# Patient Record
Sex: Female | Born: 1960 | Race: White | Hispanic: No | Marital: Married | Smoking: Never smoker
Health system: Southern US, Community
[De-identification: ages and names within clinical notes are randomized; demographics above are authoritative.]

## PROBLEM LIST (undated history)

## (undated) DIAGNOSIS — K649 Unspecified hemorrhoids: Secondary | ICD-10-CM

## (undated) DIAGNOSIS — G4700 Insomnia, unspecified: Secondary | ICD-10-CM

## (undated) DIAGNOSIS — I10 Essential (primary) hypertension: Secondary | ICD-10-CM

## (undated) DIAGNOSIS — K5790 Diverticulosis of intestine, part unspecified, without perforation or abscess without bleeding: Secondary | ICD-10-CM

## (undated) DIAGNOSIS — E119 Type 2 diabetes mellitus without complications: Secondary | ICD-10-CM

## (undated) DIAGNOSIS — K219 Gastro-esophageal reflux disease without esophagitis: Secondary | ICD-10-CM

## (undated) HISTORY — DX: Gastro-esophageal reflux disease without esophagitis: K21.9

## (undated) HISTORY — DX: Diverticulosis of intestine, part unspecified, without perforation or abscess without bleeding: K57.90

## (undated) HISTORY — DX: Insomnia, unspecified: G47.00

## (undated) HISTORY — DX: Essential (primary) hypertension: I10

## (undated) HISTORY — PX: GYNECOLOGIC CRYOSURGERY: SHX857

## (undated) HISTORY — DX: Type 2 diabetes mellitus without complications: E11.9

## (undated) HISTORY — PX: ENDOMETRIAL ABLATION: SHX621

## (undated) HISTORY — DX: Unspecified hemorrhoids: K64.9

---

## 1998-07-12 HISTORY — PX: CHOLECYSTECTOMY: SHX55

## 2004-08-04 ENCOUNTER — Ambulatory Visit: Payer: Self-pay | Admitting: General Practice

## 2004-10-01 ENCOUNTER — Ambulatory Visit: Payer: Self-pay | Admitting: Unknown Physician Specialty

## 2004-11-03 ENCOUNTER — Ambulatory Visit: Payer: Self-pay | Admitting: Gastroenterology

## 2004-12-02 ENCOUNTER — Ambulatory Visit: Payer: Self-pay | Admitting: Gastroenterology

## 2005-01-15 ENCOUNTER — Ambulatory Visit: Payer: Self-pay | Admitting: Gastroenterology

## 2005-02-01 ENCOUNTER — Ambulatory Visit: Payer: Self-pay | Admitting: Gastroenterology

## 2005-08-03 ENCOUNTER — Ambulatory Visit: Payer: Self-pay | Admitting: General Practice

## 2006-08-09 ENCOUNTER — Ambulatory Visit: Payer: Self-pay | Admitting: General Practice

## 2006-09-06 ENCOUNTER — Ambulatory Visit: Payer: Self-pay | Admitting: Gastroenterology

## 2006-09-07 ENCOUNTER — Ambulatory Visit: Payer: Self-pay | Admitting: Gastroenterology

## 2007-05-19 ENCOUNTER — Ambulatory Visit: Payer: Self-pay | Admitting: General Practice

## 2007-07-13 HISTORY — PX: APPENDECTOMY: SHX54

## 2007-08-08 ENCOUNTER — Ambulatory Visit: Payer: Self-pay | Admitting: General Practice

## 2007-10-13 ENCOUNTER — Ambulatory Visit: Payer: Self-pay | Admitting: General Surgery

## 2008-05-20 ENCOUNTER — Ambulatory Visit: Payer: Self-pay | Admitting: Specialist

## 2008-05-31 ENCOUNTER — Ambulatory Visit (HOSPITAL_COMMUNITY): Admission: RE | Admit: 2008-05-31 | Discharge: 2008-06-01 | Payer: Self-pay | Admitting: Neurosurgery

## 2008-06-13 ENCOUNTER — Encounter: Admission: RE | Admit: 2008-06-13 | Discharge: 2008-06-13 | Payer: Self-pay | Admitting: Neurosurgery

## 2008-07-09 ENCOUNTER — Encounter: Admission: RE | Admit: 2008-07-09 | Discharge: 2008-07-09 | Payer: Self-pay | Admitting: Neurosurgery

## 2008-07-12 HISTORY — PX: CERVICAL FUSION: SHX112

## 2008-08-08 ENCOUNTER — Encounter: Admission: RE | Admit: 2008-08-08 | Discharge: 2008-08-08 | Payer: Self-pay | Admitting: Neurosurgery

## 2008-08-27 ENCOUNTER — Ambulatory Visit: Payer: Self-pay | Admitting: General Practice

## 2008-09-23 ENCOUNTER — Ambulatory Visit: Payer: Self-pay | Admitting: Neurosurgery

## 2008-10-31 ENCOUNTER — Encounter: Admission: RE | Admit: 2008-10-31 | Discharge: 2008-10-31 | Payer: Self-pay | Admitting: Neurosurgery

## 2009-09-01 ENCOUNTER — Ambulatory Visit: Payer: Self-pay | Admitting: Unknown Physician Specialty

## 2009-10-10 HISTORY — PX: RETROPUBIC SLING: SHX2343

## 2009-10-30 ENCOUNTER — Ambulatory Visit: Payer: Self-pay | Admitting: Unknown Physician Specialty

## 2009-11-05 ENCOUNTER — Ambulatory Visit: Payer: Self-pay | Admitting: Unknown Physician Specialty

## 2009-11-10 ENCOUNTER — Ambulatory Visit: Payer: Self-pay | Admitting: Unknown Physician Specialty

## 2009-11-14 ENCOUNTER — Emergency Department: Payer: Self-pay | Admitting: Internal Medicine

## 2009-11-19 ENCOUNTER — Ambulatory Visit: Payer: Self-pay | Admitting: Unknown Physician Specialty

## 2009-11-19 HISTORY — PX: REVISION URINARY SLING: SHX6213

## 2010-02-02 ENCOUNTER — Ambulatory Visit: Payer: Self-pay | Admitting: Gastroenterology

## 2010-07-16 ENCOUNTER — Ambulatory Visit
Admission: RE | Admit: 2010-07-16 | Discharge: 2010-07-16 | Payer: Self-pay | Source: Home / Self Care | Attending: Urology | Admitting: Urology

## 2010-07-16 LAB — POCT I-STAT 4, (NA,K, GLUC, HGB,HCT)
Glucose, Bld: 104 mg/dL — ABNORMAL HIGH (ref 70–99)
HCT: 43 % (ref 36.0–46.0)
Hemoglobin: 14.6 g/dL (ref 12.0–15.0)
Potassium: 4.1 mEq/L (ref 3.5–5.1)
Sodium: 140 mEq/L (ref 135–145)

## 2010-09-08 ENCOUNTER — Ambulatory Visit: Payer: Self-pay | Admitting: Unknown Physician Specialty

## 2010-11-23 ENCOUNTER — Other Ambulatory Visit: Payer: Self-pay | Admitting: Neurosurgery

## 2010-11-23 DIAGNOSIS — M542 Cervicalgia: Secondary | ICD-10-CM

## 2010-11-24 NOTE — Op Note (Signed)
Michelle Figueroa, Michelle Figueroa NO.:  [id number]   MEDICAL RECORD NO.:  [id number]          PATIENT TYPE:  INP   LOCATION:  3536                         FACILITY:  [facility]   PHYSICIAN:  Donalee Citrin, M.D.        DATE OF BIRTH:  [birth year]   DATE OF PROCEDURE:  05/31/2008  DATE OF DISCHARGE:                               OPERATIVE REPORT   PREOPERATIVE DIAGNOSIS:  Cervical spondylosis with radiculopathy right  greater than left at C5-6, C6-7 with ruptured disk and spondylosis  causing severe stenosis at each of these levels.   PROCEDURE:  Anterior cervical diskectomy and fusion at C5-6, C6-7 using  a 7-mm cornerstone allograft wedges and a 40-mm venture plate with 629  MHz screw.   SURGEON:  Donalee Citrin, MD   ASSISTANT:  Kathaleen Maser. Pool, MD   ANESTHESIA:  General endotracheal.   HISTORY OF PRESENT ILLNESS:  The patient is a very pleasant 47-year  female who has had long-standing neck and right arm pain with numbness  and tingling in her fingers.  They are refractory in all forms of  treatment.  Preoperative exam showed weakness in the right triceps.  Imaging showed severe stenosis from ruptured disk and spondylosis at C5-  6 and C6-7.  Due to the patient's clinical exam, failure to conservative  treatment,  and MRI findings, the patient was recommended anterior  cervical diskectomy fusion.  Risks and benefits of the operation were  explained to the patient.  He understood and agreed to proceed forth.   The patient was brought into the OR, was induced general anesthesia,  positioned supine, neck flexed slightly and 5 pounds of Holter tracing.  The right side of the neck was prepped and draped in usual sterile  fashion.  Preop incision was localized at the appropriate level.  A  curvilinear incision was made just above the midline to the anterior  border of sternocleidomastoid and superficial layer of the platysma,  this was dissected out and divided longitudinally.  The  avascular plane  to sternomastoid and strap muscle was developed down to the prevertebral  fascia.  Prevertebral fascia was dissected with Kitners.  Intraoperative  x-ray confirmed initially the marked via the C4-5 disk space, so  annulotomy was made 2 disk space below the C5-6, C6-7.  Large anterior  osteophytes bitten off with 2-mm Kerrison punch and then using a high-  speed drill both interspaces were drilled down the posterior annulus and  osteophyte complex.  At this point, the operative microscope draped,  brought into the field under microscopic illumination.  The C6-7 disk  space was first drilled down to the posterior annulus, which was  underbitten with a 1-mm Kerrison punch aggressively and underbitten the  endplate.  PLL was identified, which was removed in a piecemeal fashion  exposing the thecal sac.  Then aggressive underbiting of both endplates  and marching across first to the right C7 pedicle.  There was a marked  spondylosis with facet arthropathy and disk herniations displacing the  right C7 nerve root.  This was all removed in a piecemeal  fashion  decompressing the right C7 nerve root flush with a pedicle and  skeletonizing nerve root with initial few millimeters proximal takeoff.  Then after this adequate decompression was achieved with the right C7  neural foramen, attention marked to marching off and aggressive  underbiting of the C6 and C vertebral bodies.  The uncinate process was  not stopped to the left C7 neural foramen, which was unroofed and  decompressed.  At the end of diskectomy, there was no further stenosis.  Gelfoam was placed in the disk space.  Attention was taken to C5-C6.  Working at C5-6 in a similar fashion.  Aggressive underbiting of both  endplates was achieved.  There was a soft disk herniation displacing  predominantly the leftward aspect of the canal and spinal cord.  This  was all aggressively removed and aggressive underbiting of the  endplates  removed, and a large osteophyte coming off the C5 vertebral body  displacing the spinal cord there, as well as the C6 pedicle was palpated  and C6 nerve root was identified and was decompressed out its foramen.  Then marching across to the right side in a similar fashion.  The right  C6 nerve root was decompressed.  Again, predominant uncinate hypertrophy  and spondylosis was compressed in the proximal right C6 nerve root.  This was all decompressed radically.  At the end of the diskectomy here,  it was also decompressed and was copiously irrigated.  Meticulous  hemostasis was maintained.  A 7-mm allograft wedge was inserted at C5-6  and subsequently at C6-7.  Then a 40-mm Venture plate was placed.  All  screws had excellent purchase.  Locking mechanisms were engaged.  The  wounds were copiously irrigated.  Meticulous hemostasis was maintained.  The platysma was reapproximated with interrupted Vicryl and the skin was  closed with running 4-0 subcuticular, Dermabond, Benzoin, and Steri-  Strips.  The patient went to the recovery room in stable condition.  At  the end of the case, sponge and instrument count was correct.           ______________________________  Donalee Citrin, M.D.     GC/MEDQ  D:  05/31/2008  T:  06/01/2008  Job:  [id number]

## 2010-12-10 ENCOUNTER — Ambulatory Visit
Admission: RE | Admit: 2010-12-10 | Discharge: 2010-12-10 | Disposition: A | Discharge status: Home / Self Care | Payer: BC Managed Care – PPO | Source: Ambulatory Visit | Attending: Neurosurgery | Admitting: Neurosurgery

## 2010-12-10 DIAGNOSIS — M542 Cervicalgia: Secondary | ICD-10-CM

## 2011-04-14 LAB — CBC
HCT: 43.6
Hemoglobin: 14.5
MCHC: 33.3
MCV: 87.2
Platelets: 270
RBC: 5
RDW: 13.1
WBC: 6

## 2011-04-14 LAB — BASIC METABOLIC PANEL
BUN: 13
CO2: 25
Calcium: 9.6
Chloride: 107
Creatinine, Ser: 0.84
GFR calc Af Amer: >60
GFR calc non Af Amer: >60
Glucose, Bld: 113 — ABNORMAL HIGH
Potassium: 4.4
Sodium: 138

## 2011-09-15 ENCOUNTER — Ambulatory Visit: Payer: Self-pay

## 2011-09-27 ENCOUNTER — Ambulatory Visit: Payer: Self-pay | Admitting: General Practice

## 2012-04-03 ENCOUNTER — Ambulatory Visit: Payer: Self-pay | Admitting: General Practice

## 2012-09-19 ENCOUNTER — Ambulatory Visit: Payer: Self-pay | Admitting: General Practice

## 2013-04-12 ENCOUNTER — Ambulatory Visit: Payer: Self-pay | Admitting: Unknown Physician Specialty

## 2013-08-29 ENCOUNTER — Ambulatory Visit: Payer: Self-pay | Admitting: General Practice

## 2013-09-26 ENCOUNTER — Ambulatory Visit: Payer: Self-pay

## 2014-02-15 ENCOUNTER — Ambulatory Visit: Payer: Self-pay | Admitting: Gastroenterology

## 2014-10-01 ENCOUNTER — Ambulatory Visit: Payer: Self-pay

## 2015-02-25 ENCOUNTER — Other Ambulatory Visit: Payer: Self-pay | Admitting: Physician Assistant

## 2015-03-03 ENCOUNTER — Encounter: Payer: Self-pay | Admitting: Dietician

## 2015-03-03 ENCOUNTER — Encounter: Payer: BLUE CROSS/BLUE SHIELD | Attending: Physician Assistant | Admitting: Dietician

## 2015-03-03 ENCOUNTER — Other Ambulatory Visit: Payer: Self-pay | Admitting: Physician Assistant

## 2015-03-03 VITALS — Ht 65.0 in | Wt 185.9 lb

## 2015-03-03 DIAGNOSIS — E669 Obesity, unspecified: Secondary | ICD-10-CM | POA: Insufficient documentation

## 2015-03-03 DIAGNOSIS — E663 Overweight: Secondary | ICD-10-CM

## 2015-03-03 NOTE — Progress Notes (Signed)
Medical Nutrition Therapy: Visit start time: 1030  end time: 1130  Assessment:  Diagnosis: obesity Past medical history: HTN, GERD Psychosocial issues/ stress concerns: patient reports high stress level, does report stress eating Preferred learning method:  . Auditory . Visual .   Current weight: 185.9lbs  Height: 5'5" Medications, supplements: reviewed list in chart with patient Progress and evaluation: patient reports working on weight loss often throughout her life, most recently lost 23lbs prior to son's wedding last year.         Has participated in weight watchers, took phentermine, other programs for weight loss with short-term success.   Physical activity: none recently, was going to gym 3 times per week when losing weight last year.  Dietary Intake:  Usual eating pattern includes 2-3 meals and 0-1 snacks per day. Dining out frequency: 4-5 meals per week.  Breakfast: nutrigrain bar, coffee with 1tsp sugar, half & half Snack: none Lunch: usually sandwich with tomato or chicken ([employer]), no fries. Was eating sandwich thins, taking lunch to work. Snack: occasionally when dinner is late -- frito chips or pimento cheese, wheat thins or triscuits Supper: Grilled meats, some red meats pork chops, chicken. Sometimes salad, potatoes with butter. Largest meal, usually late.  Snack: popcorn, cooked on the stove in oil. 2x a week Beverages: coffee, ginger ale (regular). Reports very little fluid intake during the day.  Nutrition Care Education: Topics covered: weight management Basic nutrition: basic food groups, appropriate nutrient balance Weight control: 1300kcal meal plan, portion control, behavioral changes for weight loss Other lifestyle changes:  Options for resuming regular exercise  Nutritional Diagnosis:  [place]-3.3 Overweight/obesity As related to menopause changes, inactivity, large food portions.  As evidenced by patient report.  Intervention: Instruction as noted  above.   Set goals to improve nutrient balance overall and control carb intake, and to increase physical activity.   Patient felt she would not need RD follow-up at this time, encouraged her to call as needed with any questions or concerns.     Education Materials given:  . Food lists/ Planning A Balanced Meal . Sample meal pattern/ menus: Quick and Heatlhy Meal Ideas, Top 10 diet changes packet . Goals/ instructions   Learner/ who was taught:  . Patient   Level of understanding: Marland Kitchen Verbalizes/ demonstrates competency  Demonstrated degree of understanding via:   Teach back Learning barriers: . None  Willingness to learn/ readiness for change: . Acceptance, ready for change  Monitoring and Evaluation:  Dietary intake, exercise, and body weight      follow up: prn

## 2015-03-03 NOTE — Patient Instructions (Addendum)
   Use whole grain starch options such as brown rice, limit portions by using smaller plates.   Limit regular soda/ ginger ale to 1 glass daily, increase water intake. Average fluid goal 64 oz daily from all sources. Try aiming for 2 bottles/ glasses water daily in addition to coffee and 1 ginger ale.   Plan ahead for balanced meals, lunches to take to work. Use menus and 1300kcal meal plan to help.   Gradually increase exercise as able, begin considering options.

## 2015-07-09 ENCOUNTER — Other Ambulatory Visit: Payer: Self-pay | Admitting: Physician Assistant

## 2015-09-18 ENCOUNTER — Other Ambulatory Visit: Payer: Self-pay | Admitting: Advanced Practice Midwife

## 2015-09-18 DIAGNOSIS — Z1231 Encounter for screening mammogram for malignant neoplasm of breast: Secondary | ICD-10-CM

## 2015-10-03 ENCOUNTER — Emergency Department
Admission: EM | Admit: 2015-10-03 | Discharge: 2015-10-03 | Disposition: A | Discharge status: Home / Self Care | Payer: BLUE CROSS/BLUE SHIELD | Attending: Emergency Medicine | Admitting: Emergency Medicine

## 2015-10-03 ENCOUNTER — Encounter: Payer: Self-pay | Admitting: Emergency Medicine

## 2015-10-03 DIAGNOSIS — K529 Noninfective gastroenteritis and colitis, unspecified: Secondary | ICD-10-CM | POA: Diagnosis not present

## 2015-10-03 DIAGNOSIS — E860 Dehydration: Secondary | ICD-10-CM | POA: Diagnosis present

## 2015-10-03 DIAGNOSIS — Z792 Long term (current) use of antibiotics: Secondary | ICD-10-CM | POA: Insufficient documentation

## 2015-10-03 DIAGNOSIS — Z79899 Other long term (current) drug therapy: Secondary | ICD-10-CM | POA: Diagnosis not present

## 2015-10-03 LAB — COMPREHENSIVE METABOLIC PANEL
ALBUMIN: 4.3 g/dL (ref 3.5–5.0)
ALK PHOS: 87 U/L (ref 38–126)
ALT: 96 U/L — AB (ref 14–54)
AST: 94 U/L — ABNORMAL HIGH (ref 15–41)
Anion gap: 9 (ref 5–15)
BILIRUBIN TOTAL: 0.7 mg/dL (ref 0.3–1.2)
BUN: 16 mg/dL (ref 6–20)
CALCIUM: 9.6 mg/dL (ref 8.9–10.3)
CO2: 21 mmol/L — AB (ref 22–32)
CREATININE: 0.7 mg/dL (ref 0.44–1.00)
Chloride: 109 mmol/L (ref 101–111)
GFR calc Af Amer: >60 mL/min (ref >60)
GFR calc non Af Amer: >60 mL/min (ref >60)
GLUCOSE: 93 mg/dL (ref 65–99)
Potassium: 3.6 mmol/L (ref 3.5–5.1)
SODIUM: 139 mmol/L (ref 135–145)
TOTAL PROTEIN: 7.6 g/dL (ref 6.5–8.1)

## 2015-10-03 LAB — URINALYSIS COMPLETE WITH MICROSCOPIC (ARMC ONLY)
BILIRUBIN URINE: NEGATIVE
GLUCOSE, UA: NEGATIVE mg/dL
Hgb urine dipstick: NEGATIVE
Ketones, ur: NEGATIVE mg/dL
Leukocytes, UA: NEGATIVE
NITRITE: NEGATIVE
PH: 6 (ref 5.0–8.0)
Protein, ur: NEGATIVE mg/dL
Specific Gravity, Urine: 1.011 (ref 1.005–1.030)

## 2015-10-03 LAB — CBC
HCT: 45.9 % (ref 35.0–47.0)
Hemoglobin: 15.8 g/dL (ref 12.0–16.0)
MCH: 29.5 pg (ref 26.0–34.0)
MCHC: 34.4 g/dL (ref 32.0–36.0)
MCV: 85.7 fL (ref 80.0–100.0)
PLATELETS: 220 10*3/uL (ref 150–440)
RBC: 5.35 MIL/uL — ABNORMAL HIGH (ref 3.80–5.20)
RDW: 13.4 % (ref 11.5–14.5)
WBC: 7.5 10*3/uL (ref 3.6–11.0)

## 2015-10-03 LAB — LIPASE, BLOOD: Lipase: 27 U/L (ref 11–51)

## 2015-10-03 LAB — TROPONIN I: Troponin I: <0.03 ng/mL (ref <0.031)

## 2015-10-03 MED ORDER — ONDANSETRON HCL 4 MG/2ML IJ SOLN
INTRAMUSCULAR | Status: AC
  Administered 2015-10-03: 4 mg via INTRAVENOUS
  Filled 2015-10-03: qty 2

## 2015-10-03 MED ORDER — SODIUM CHLORIDE 0.9 % IV SOLN
1000.0000 mL | Freq: Once | INTRAVENOUS | Status: AC
  Administered 2015-10-03: 1000 mL via INTRAVENOUS

## 2015-10-03 MED ORDER — ONDANSETRON HCL 4 MG PO TABS: 4.0000 mg | ORAL_TABLET | Freq: Every day | ORAL | Status: DC | PRN

## 2015-10-03 MED ORDER — ONDANSETRON HCL 4 MG/2ML IJ SOLN
4.0000 mg | Freq: Once | INTRAMUSCULAR | Status: AC
  Administered 2015-10-03: 4 mg via INTRAVENOUS

## 2015-10-03 NOTE — ED Provider Notes (Signed)
[facility] Emergency Department Provider Note  ____________________________________________    I have reviewed the triage vital signs and the nursing notes.   HISTORY  Chief Complaint Dehydration    HPI Michelle Figueroa is a 55 y.o. female who presents with complaints of nausea, diarrhea and abdominal cramping for 2 days. Patient reports she feels dehydrated. She complains of generalized abdominal cramping with watery diarrhea. Severe nausea but no vomiting. Denies fevers or chills. No known sick contacts. No recent travel     History reviewed. No pertinent past medical history.  There are no active problems to display for this patient.   History reviewed. No pertinent past surgical history.  Current Outpatient Rx  Name  Route  Sig  Dispense  Refill  . ALPRAZolam (XANAX) 0.25 MG tablet            0   . ESTRACE VAGINAL 0.1 MG/GM vaginal cream            0     Dispense as written.   . fluconazole (DIFLUCAN) 150 MG tablet            0   . lisinopril (PRINIVIL,ZESTRIL) 10 MG tablet            1   . metoprolol succinate (TOPROL-XL) 50 MG 24 hr tablet            1   . nitrofurantoin, macrocrystal-monohydrate, (MACROBID) 100 MG capsule            0   . omeprazole (PRILOSEC) 20 MG capsule   Oral   Take by mouth.         . ondansetron (ZOFRAN) 4 MG tablet   Oral   Take 1 tablet (4 mg total) by mouth daily as needed for nausea or vomiting.   20 tablet   1   . zolpidem (AMBIEN) 10 MG tablet            4     Allergies Erythromycin ethylsuccinate and Oxycodone-acetaminophen  No family history on file.  Social History Social History  Substance Use Topics  . Smoking status: Never Smoker   . Smokeless tobacco: None  . Alcohol Use: 1.8 oz/week    3 Standard drinks or equivalent per week    Review of Systems  Constitutional: Negative for fever. Eyes: Negative for redness ENT: Negative for sore  throat Cardiovascular: Negative for chest pain Respiratory: Negative for cough Gastrointestinal: As above Genitourinary: Negative for dysuria. Musculoskeletal: Negative for back pain. Skin: Negative for rash. Neurological: Negative for focal weakness Psychiatric: no anxiety    ____________________________________________   PHYSICAL EXAM:  VITAL SIGNS: ED Triage Vitals  Enc Vitals Group     BP 10/03/15 1741 149/116 mmHg     Pulse Rate 10/03/15 1741 123     Resp 10/03/15 1741 20     Temp 10/03/15 1741 98.6 F (37 C)     Temp src --      SpO2 10/03/15 1741 99 %     Weight 10/03/15 1741 180 lb (81.647 kg)     Height 10/03/15 1741 5\' 6"  (1.676 m)     Head Cir --      Peak Flow --      Pain Score 10/03/15 1740 2     Pain Loc --      Pain Edu? --      Excl. in [place]? --      Constitutional: Alert and oriented. Well appearing and  in no distress.  Eyes: Conjunctivae are normal. ENT   Head: Normocephalic and atraumatic.   Mouth/Throat: Mucous membranes are moist. Cardiovascular: Normal rate, regular rhythm. Normal and symmetric distal pulses are present in the upper extremities.  Respiratory: Normal respiratory effort without tachypnea nor retractions.  Gastrointestinal: Soft and non-tender in all quadrants. No distention. There is no CVA tenderness. Genitourinary: deferred Musculoskeletal: Nontender with normal range of motion in all extremities. No lower extremity tenderness nor edema. Neurologic:  Normal speech and language. No gross focal neurologic deficits are appreciated. Skin:  Skin is warm, dry and intact. No rash noted. Psychiatric: Mood and affect are normal. Patient exhibits appropriate insight and judgment.  ____________________________________________    LABS (pertinent positives/negatives)  Labs Reviewed  COMPREHENSIVE METABOLIC PANEL - Abnormal; Notable for the following:    CO2 21 (*)    AST 94 (*)    ALT 96 (*)    All other components within  normal limits  CBC - Abnormal; Notable for the following:    RBC 5.35 (*)    All other components within normal limits  URINALYSIS COMPLETEWITH MICROSCOPIC ([facility] ONLY) - Abnormal; Notable for the following:    Color, Urine YELLOW (*)    APPearance CLEAR (*)    Bacteria, UA RARE (*)    Squamous Epithelial / LPF 0-5 (*)    All other components within normal limits  LIPASE, BLOOD  TROPONIN I    ____________________________________________   EKG  ED ECG REPORT I, Lavonia Drafts, the attending physician, personally viewed and interpreted this ECG.  Date: 10/03/2015 EKG Time: 5:41 PM Rate: 115 Rhythm: Sinus tachycardia QRS Axis: normal Intervals: normal ST/T Wave abnormalities: normal Conduction Disturbances: none Narrative Interpretation: unremarkable   ____________________________________________    RADIOLOGY  None  ____________________________________________   PROCEDURES  Procedure(s) performed: none  Critical Care performed: none  ____________________________________________   INITIAL IMPRESSION / ASSESSMENT AND PLAN / ED COURSE  Pertinent labs & imaging results that were available during my care of the patient were reviewed by me and considered in my medical decision making (see chart for details).  Patient resents with nausea, diarrhea and abdominal cramping consistent with gastroenteritis which is rampant indicated this time. We will treat with IV fluids, IV Zofran, check labs and reevaluate   Patient feeling significant better after IV Zofran and fluids. She reports she has an appetite. Her lab work is reassuring. We will discharge her with by mouth Zofran and return precautions ____________________________________________   FINAL CLINICAL IMPRESSION(S) / ED DIAGNOSES  Final diagnoses:  Gastroenteritis          Lavonia Drafts, MD 10/03/15 [phone]

## 2015-10-03 NOTE — ED Notes (Signed)
Sent in from [place]  With dehydration and some EKG changes  States she developed n/v body ahces the beginning of week  also having some chesr discomfort

## 2015-10-06 ENCOUNTER — Ambulatory Visit
Admission: RE | Admit: 2015-10-06 | Discharge: 2015-10-06 | Disposition: A | Discharge status: Home / Self Care | Payer: BLUE CROSS/BLUE SHIELD | Source: Ambulatory Visit | Attending: Advanced Practice Midwife | Admitting: Advanced Practice Midwife

## 2015-10-06 DIAGNOSIS — Z1231 Encounter for screening mammogram for malignant neoplasm of breast: Secondary | ICD-10-CM | POA: Diagnosis present

## 2015-10-16 DIAGNOSIS — M79672 Pain in left foot: Secondary | ICD-10-CM | POA: Diagnosis not present

## 2015-10-16 DIAGNOSIS — M659 Synovitis and tenosynovitis, unspecified: Secondary | ICD-10-CM | POA: Diagnosis not present

## 2015-10-16 DIAGNOSIS — M79671 Pain in right foot: Secondary | ICD-10-CM | POA: Diagnosis not present

## 2015-10-20 ENCOUNTER — Ambulatory Visit: Payer: Self-pay | Admitting: Podiatry

## 2015-11-05 DIAGNOSIS — F419 Anxiety disorder, unspecified: Secondary | ICD-10-CM | POA: Diagnosis not present

## 2015-11-05 DIAGNOSIS — I10 Essential (primary) hypertension: Secondary | ICD-10-CM | POA: Diagnosis not present

## 2015-11-05 DIAGNOSIS — G4700 Insomnia, unspecified: Secondary | ICD-10-CM | POA: Diagnosis not present

## 2015-11-10 ENCOUNTER — Ambulatory Visit (INDEPENDENT_AMBULATORY_CARE_PROVIDER_SITE_OTHER): Payer: BLUE CROSS/BLUE SHIELD | Admitting: Primary Care

## 2015-11-10 ENCOUNTER — Encounter: Payer: Self-pay | Admitting: Primary Care

## 2015-11-10 VITALS — BP 118/76 | HR 63 | Temp 98.1°F | Ht 65.0 in | Wt 194.8 lb

## 2015-11-10 DIAGNOSIS — I10 Essential (primary) hypertension: Secondary | ICD-10-CM | POA: Insufficient documentation

## 2015-11-10 DIAGNOSIS — E669 Obesity, unspecified: Secondary | ICD-10-CM | POA: Insufficient documentation

## 2015-11-10 DIAGNOSIS — K219 Gastro-esophageal reflux disease without esophagitis: Secondary | ICD-10-CM

## 2015-11-10 DIAGNOSIS — G4700 Insomnia, unspecified: Secondary | ICD-10-CM

## 2015-11-10 MED ORDER — SUVOREXANT 10 MG PO TABS: 10.0000 mg | ORAL_TABLET | Freq: Every day | ORAL | Status: DC

## 2015-11-10 MED ORDER — METOPROLOL SUCCINATE ER 50 MG PO TB24: 50.0000 mg | ORAL_TABLET | Freq: Every day | ORAL | Status: DC

## 2015-11-10 MED ORDER — LISINOPRIL 10 MG PO TABS: 10.0000 mg | ORAL_TABLET | Freq: Every day | ORAL | Status: DC

## 2015-11-10 NOTE — Progress Notes (Signed)
Subjective:    Patient ID: Michelle Figueroa, female    DOB: 05/19/1961, 55 y.o.   MRN: [id number]  HPI  Michelle Figueroa is a 55 year old female who presents today to establish care and discuss the problems mentioned below. Will obtain old records. Her last physical was in May 2016.  1) Essential Hypertension: Diagnosed 5-6 years ago,. Currently managed on lisinopril 10 mg and Toprol XL 50 mg. Blood pressure in the clinic today is stable at 118/76. Last BMP in 11/2014 stable. Denies dizziness, chest pain, shortness of breath.  2) Obesity: Once managed on Phentermine in the past with weight loss of 25 pounds. She slowly gained this weight back over 2 years. She is interested in starting Contrave and will be seeing a weight loss specialist later this week.  She endorses a fair diet: Breakfast: Skips sometimes, yogurt with fruit Lunch: Sandwich rounds with [place] Dinner: Grilled lean meats, red meats, limited vegetables, potatoes, rice, pastas Snacks: None Desserts: None Beverages: Limited water intake, 2 cups of coffee  Exercise: She does not currently exercise  3) GERD: Currently managed on omeprazole 20 mg. She will experience symptoms of esophageal burning and epigastric pain without her medication.   4) Insomnia: Long history of for years. No difficulty falling asleep, wakes up multiple times nightly, able to fall back asleep. Currently managed on Alprazolam 0.25 mg for which will help temporarily as she does wake during the night. Once managed on Ambien in the past. Has not tried OTC treatment.   Review of Systems  Constitutional: Negative for unexpected weight change.  Respiratory: Negative for shortness of breath.   Cardiovascular: Negative for chest pain.  Gastrointestinal:       Gerd  Neurological: Negative for dizziness and headaches.  Psychiatric/Behavioral: Positive for sleep disturbance. The patient is not nervous/anxious.        No past medical history on file.     Social History   Social History  . Marital Status: Married    Spouse Name: N/A  . Number of Children: N/A  . Years of Education: N/A   Occupational History  . Not on file.   Social History Main Topics  . Smoking status: Never Smoker   . Smokeless tobacco: Not on file  . Alcohol Use: 1.8 oz/week    3 Standard drinks or equivalent per week  . Drug Use: Not on file  . Sexual Activity: Not on file   Other Topics Concern  . Not on file   Social History Narrative   Married.   2 children.   Works in [employer] at [employer].    Enjoys traveling, spending time with family.     Past Surgical History  Procedure Laterality Date  . Cholecystectomy  2000  . Appendectomy  2009  . Cervical fusion  2010    Family History  Problem Relation Age of Onset  . Arthritis Mother   . Lung cancer Father   . Hypertension Father   . Diabetes Mother     Allergies  Allergen Reactions  . Erythromycin Ethylsuccinate Rash  . Oxycodone-Acetaminophen Nausea Only    Current Outpatient Prescriptions on File Prior to Visit  Medication Sig Dispense Refill  . ALPRAZolam (XANAX) 0.25 MG tablet Take 0.25 mg by mouth as needed.   0  . ESTRACE VAGINAL 0.1 MG/GM vaginal cream Place 1 Applicatorful vaginally 3 (three) times a week.   0  . omeprazole (PRILOSEC) 20 MG capsule Take 20 mg by mouth  daily.      No current facility-administered medications on file prior to visit.    BP 118/76 mmHg  Pulse 63  Temp(Src) 98.1 F (36.7 C) (Oral)  Ht 5\' 5"  (1.651 m)  Wt 194 lb 12.8 oz (88.361 kg)  BMI 32.42 kg/m2  SpO2 98%    Objective:   Physical Exam  Constitutional: She appears well-nourished.  Neck: Neck supple.  Cardiovascular: Normal rate and regular rhythm.   Pulmonary/Chest: Effort normal and breath sounds normal.  Skin: Skin is warm and dry.  Psychiatric: She has a normal mood and affect.          Assessment & Plan:

## 2015-11-10 NOTE — Patient Instructions (Signed)
I've sent refills of lisinopril and metoprolol to your pharmacy.  Try the Belsomra for insomnia. Take 1 tablet by mouth 30 minutes prior to bedtime.  Please schedule a physical with me within the next 3 months. You may also schedule a lab only appointment 3-4 days prior. We will discuss your lab results in detail during your physical.  It was a pleasure to meet you today! Please don't hesitate to call me with any questions. Welcome to [facility]!

## 2015-11-10 NOTE — Assessment & Plan Note (Signed)
Diet consists of a lot of pastas, potatoes, and rice. Discussed to limit consumption and start exercising. She will be meeting with a weight loss specialist to discuss use of Contrave.

## 2015-11-10 NOTE — Assessment & Plan Note (Signed)
Managed on lisinopril 10 mg and Toprol XL 50. Stable on current regimen, continue.

## 2015-11-10 NOTE — Assessment & Plan Note (Signed)
Wakes during the night, every night. Discouraged the use of alprazolam as this is a short acting medication that will not help for maintenance insomnia. Will try Belsomra, if too expensive, may need to consider Lunesta or Restoril.

## 2015-11-10 NOTE — Progress Notes (Signed)
Pre visit review using our clinic review tool, if applicable. No additional management support is needed unless otherwise documented below in the visit note. 

## 2015-11-10 NOTE — Assessment & Plan Note (Signed)
Managed on long term PPI and without meds she will experience symptoms of reflux. Continue for now.

## 2015-11-13 DIAGNOSIS — E6609 Other obesity due to excess calories: Secondary | ICD-10-CM | POA: Diagnosis not present

## 2015-11-13 DIAGNOSIS — Z6831 Body mass index (BMI) 31.0-31.9, adult: Secondary | ICD-10-CM | POA: Diagnosis not present

## 2015-11-26 ENCOUNTER — Ambulatory Visit: Payer: BLUE CROSS/BLUE SHIELD | Admitting: Family Medicine

## 2015-11-27 DIAGNOSIS — M659 Synovitis and tenosynovitis, unspecified: Secondary | ICD-10-CM | POA: Diagnosis not present

## 2015-11-27 DIAGNOSIS — M79672 Pain in left foot: Secondary | ICD-10-CM | POA: Diagnosis not present

## 2015-12-02 ENCOUNTER — Other Ambulatory Visit: Payer: Self-pay | Admitting: Podiatry

## 2015-12-02 DIAGNOSIS — M659 Synovitis and tenosynovitis, unspecified: Secondary | ICD-10-CM

## 2015-12-02 DIAGNOSIS — M79672 Pain in left foot: Secondary | ICD-10-CM

## 2015-12-10 ENCOUNTER — Ambulatory Visit
Admission: RE | Admit: 2015-12-10 | Discharge: 2015-12-10 | Disposition: A | Discharge status: Home / Self Care | Payer: BLUE CROSS/BLUE SHIELD | Source: Ambulatory Visit | Attending: Podiatry | Admitting: Podiatry

## 2015-12-10 DIAGNOSIS — M84375A Stress fracture, left foot, initial encounter for fracture: Secondary | ICD-10-CM | POA: Diagnosis not present

## 2015-12-10 DIAGNOSIS — M659 Synovitis and tenosynovitis, unspecified: Secondary | ICD-10-CM

## 2015-12-10 DIAGNOSIS — M79672 Pain in left foot: Secondary | ICD-10-CM | POA: Insufficient documentation

## 2015-12-10 DIAGNOSIS — R609 Edema, unspecified: Secondary | ICD-10-CM | POA: Insufficient documentation

## 2015-12-10 DIAGNOSIS — M19072 Primary osteoarthritis, left ankle and foot: Secondary | ICD-10-CM | POA: Diagnosis not present

## 2015-12-12 DIAGNOSIS — E6609 Other obesity due to excess calories: Secondary | ICD-10-CM | POA: Diagnosis not present

## 2015-12-12 DIAGNOSIS — Z6831 Body mass index (BMI) 31.0-31.9, adult: Secondary | ICD-10-CM | POA: Diagnosis not present

## 2015-12-18 DIAGNOSIS — M84375D Stress fracture, left foot, subsequent encounter for fracture with routine healing: Secondary | ICD-10-CM | POA: Diagnosis not present

## 2015-12-18 DIAGNOSIS — M79672 Pain in left foot: Secondary | ICD-10-CM | POA: Diagnosis not present

## 2015-12-31 ENCOUNTER — Other Ambulatory Visit: Payer: Self-pay | Admitting: Primary Care

## 2015-12-31 DIAGNOSIS — G4700 Insomnia, unspecified: Secondary | ICD-10-CM

## 2015-12-31 NOTE — Telephone Encounter (Signed)
Electronically refill request for   Suvorexant (BELSOMRA) 10 MG TABS   Take 10 mg by mouth at bedtime.  Dispense: 30 tablet   Refills: 1       Notes from pharmacy:  For future refills  Last prescribed and seen on 11/10/2015. Next CPE on 02/10/2016.

## 2016-01-01 NOTE — Telephone Encounter (Signed)
Called in [place] to Ewa Gentry

## 2016-01-01 NOTE — Telephone Encounter (Signed)
Message left for patient to return my call.  

## 2016-01-01 NOTE — Telephone Encounter (Signed)
Spoken to patient and she stated that the medication is working. She is sleeping better.

## 2016-01-01 NOTE — Telephone Encounter (Signed)
Patient returned Chan's call.  Call patient back at [phone].

## 2016-01-27 DIAGNOSIS — K649 Unspecified hemorrhoids: Secondary | ICD-10-CM | POA: Diagnosis not present

## 2016-01-27 DIAGNOSIS — Z01419 Encounter for gynecological examination (general) (routine) without abnormal findings: Secondary | ICD-10-CM | POA: Diagnosis not present

## 2016-02-01 ENCOUNTER — Other Ambulatory Visit: Payer: Self-pay | Admitting: Primary Care

## 2016-02-01 ENCOUNTER — Encounter: Payer: Self-pay | Admitting: Primary Care

## 2016-02-01 DIAGNOSIS — Z0000 Encounter for general adult medical examination without abnormal findings: Secondary | ICD-10-CM

## 2016-02-01 DIAGNOSIS — E785 Hyperlipidemia, unspecified: Secondary | ICD-10-CM

## 2016-02-01 DIAGNOSIS — I10 Essential (primary) hypertension: Secondary | ICD-10-CM

## 2016-02-05 ENCOUNTER — Other Ambulatory Visit (INDEPENDENT_AMBULATORY_CARE_PROVIDER_SITE_OTHER): Payer: BLUE CROSS/BLUE SHIELD

## 2016-02-05 DIAGNOSIS — Z202 Contact with and (suspected) exposure to infections with a predominantly sexual mode of transmission: Secondary | ICD-10-CM | POA: Diagnosis not present

## 2016-02-05 DIAGNOSIS — E785 Hyperlipidemia, unspecified: Secondary | ICD-10-CM

## 2016-02-05 DIAGNOSIS — Z0000 Encounter for general adult medical examination without abnormal findings: Secondary | ICD-10-CM

## 2016-02-05 DIAGNOSIS — Z1159 Encounter for screening for other viral diseases: Secondary | ICD-10-CM | POA: Diagnosis not present

## 2016-02-05 DIAGNOSIS — I10 Essential (primary) hypertension: Secondary | ICD-10-CM | POA: Diagnosis not present

## 2016-02-06 LAB — LIPID PANEL
Chol/HDL Ratio: 4.2 ratio units (ref 0.0–4.4)
Cholesterol, Total: 178 mg/dL (ref 100–199)
HDL: 42 mg/dL (ref >39)
LDL Calculated: 74 mg/dL (ref 0–99)
Triglycerides: 312 mg/dL — ABNORMAL HIGH (ref 0–149)
VLDL Cholesterol Cal: 62 mg/dL — ABNORMAL HIGH (ref 5–40)

## 2016-02-06 LAB — COMPREHENSIVE METABOLIC PANEL
ALBUMIN: 4.5 g/dL (ref 3.5–5.5)
ALT: 58 IU/L — ABNORMAL HIGH (ref 0–32)
AST: 48 IU/L — ABNORMAL HIGH (ref 0–40)
Albumin/Globulin Ratio: 1.8 (ref 1.2–2.2)
Alkaline Phosphatase: 86 IU/L (ref 39–117)
BUN / CREAT RATIO: 22 (ref 9–23)
BUN: 19 mg/dL (ref 6–24)
Bilirubin Total: 0.5 mg/dL (ref 0.0–1.2)
CALCIUM: 9.7 mg/dL (ref 8.7–10.2)
CO2: 28 mmol/L (ref 18–29)
CREATININE: 0.88 mg/dL (ref 0.57–1.00)
Chloride: 100 mmol/L (ref 96–106)
GFR calc Af Amer: 87 mL/min/{1.73_m2} (ref >59)
GFR, EST NON AFRICAN AMERICAN: 75 mL/min/{1.73_m2} (ref >59)
GLOBULIN, TOTAL: 2.5 g/dL (ref 1.5–4.5)
Glucose: 82 mg/dL (ref 65–99)
Potassium: 4.5 mmol/L (ref 3.5–5.2)
SODIUM: 141 mmol/L (ref 134–144)
Total Protein: 7 g/dL (ref 6.0–8.5)

## 2016-02-06 LAB — HEMOGLOBIN A1C
ESTIMATED AVERAGE GLUCOSE: 111 mg/dL
HEMOGLOBIN A1C: 5.5 % (ref 4.8–5.6)

## 2016-02-08 LAB — HIV ANTIBODY (ROUTINE TESTING W REFLEX): HIV SCREEN 4TH GENERATION: NONREACTIVE

## 2016-02-08 LAB — RPR: RPR: NONREACTIVE

## 2016-02-08 LAB — HEPATITIS C ANTIBODY: Hep C Virus Ab: <0.1 s/co ratio (ref 0.0–0.9)

## 2016-02-10 ENCOUNTER — Ambulatory Visit (INDEPENDENT_AMBULATORY_CARE_PROVIDER_SITE_OTHER): Payer: BLUE CROSS/BLUE SHIELD | Admitting: Primary Care

## 2016-02-10 ENCOUNTER — Encounter: Payer: Self-pay | Admitting: Primary Care

## 2016-02-10 VITALS — BP 144/86 | HR 77 | Temp 98.1°F | Ht 65.0 in | Wt 193.0 lb

## 2016-02-10 DIAGNOSIS — Z0000 Encounter for general adult medical examination without abnormal findings: Secondary | ICD-10-CM

## 2016-02-10 DIAGNOSIS — Z9889 Other specified postprocedural states: Secondary | ICD-10-CM

## 2016-02-10 DIAGNOSIS — Z23 Encounter for immunization: Secondary | ICD-10-CM | POA: Diagnosis not present

## 2016-02-10 DIAGNOSIS — F411 Generalized anxiety disorder: Secondary | ICD-10-CM | POA: Diagnosis not present

## 2016-02-10 DIAGNOSIS — G4700 Insomnia, unspecified: Secondary | ICD-10-CM

## 2016-02-10 DIAGNOSIS — E785 Hyperlipidemia, unspecified: Secondary | ICD-10-CM | POA: Insufficient documentation

## 2016-02-10 DIAGNOSIS — Z0001 Encounter for general adult medical examination with abnormal findings: Secondary | ICD-10-CM | POA: Insufficient documentation

## 2016-02-10 DIAGNOSIS — K649 Unspecified hemorrhoids: Secondary | ICD-10-CM | POA: Diagnosis not present

## 2016-02-10 DIAGNOSIS — I10 Essential (primary) hypertension: Secondary | ICD-10-CM

## 2016-02-10 DIAGNOSIS — E781 Pure hyperglyceridemia: Secondary | ICD-10-CM

## 2016-02-10 LAB — LUTEINIZING HORMONE: LH: 20.64 m[IU]/mL

## 2016-02-10 LAB — VITAMIN D 25 HYDROXY (VIT D DEFICIENCY, FRACTURES): Vit D, 25-Hydroxy: 26.7 ng/mL — ABNORMAL LOW (ref 30.0–100.0)

## 2016-02-10 LAB — FOLLICLE STIMULATING HORMONE: FSH: 50.9 m[IU]/mL

## 2016-02-10 MED ORDER — CITALOPRAM HYDROBROMIDE 20 MG PO TABS: 20.0000 mg | ORAL_TABLET | Freq: Every day | ORAL | 1 refills | Status: DC

## 2016-02-10 MED ORDER — SUVOREXANT 10 MG PO TABS: 1.0000 | ORAL_TABLET | Freq: Every day | ORAL | 0 refills | Status: DC

## 2016-02-10 MED ORDER — HYDROCORTISONE ACETATE 25 MG RE SUPP
25.0000 mg | Freq: Two times a day (BID) | RECTAL | 0 refills | Status: DC
Start: 2016-02-10 — End: 2016-02-12

## 2016-02-10 MED ORDER — LISINOPRIL 10 MG PO TABS
10.0000 mg | ORAL_TABLET | Freq: Every day | ORAL | 3 refills | Status: DC
Start: 2016-02-10 — End: 2016-09-10

## 2016-02-10 NOTE — Assessment & Plan Note (Signed)
Long history of. Nonthrombosed external hemorrhoid noted today on exam also with small internal hemorrhoid. Prescription for Anusol [facility] suppositories sent to pharmacy. Given history of recurrent hemorrhoids with discomfort, referral placed to GI for further evaluation.

## 2016-02-10 NOTE — Patient Instructions (Addendum)
Start Citalopram 20 mg tablets for insomnia. Take 1/2 tablet by mouth daily for 8 days, then advance to 1 full tablet thereafter.  Continue Belsomra as needed for insomnia.  You will be contacted regarding your referral to GI for discussion of hemorrhoid removal.  Please let us know if you have not heard back within one week.   Insert the suppositories twice daily for 6 days for your current hemorrhoid.  Complete lab work prior to leaving today. I will notify you of your results once received.   Your triglycerides are too high. Start Fish Oil 1000 mg capsules. Take 1 capsule by mouth twice daily with meals. Take a look at the information below regarding food choices and triglycerides.  You need 1200 mg of calcium and 800 units of vitamin D.   Follow up in 2 months for re-evaluation of anxiety and insomnia.  It was a pleasure to see you today!  Food Choices to Lower Your Triglycerides Triglycerides are a type of fat in your blood. High levels of triglycerides can increase the risk of heart disease and stroke. If your triglyceride levels are high, the foods you eat and your eating habits are very important. Choosing the right foods can help lower your triglycerides.  WHAT GENERAL GUIDELINES DO I NEED TO FOLLOW?  Lose weight if you are overweight.   Limit or avoid alcohol.   Fill one half of your plate with vegetables and green salads.   Limit fruit to two servings a day. Choose fruit instead of juice.   Make one fourth of your plate whole grains. Look for the word "whole" as the first word in the ingredient list.  Fill one fourth of your plate with lean protein foods.  Enjoy fatty fish (such as salmon, mackerel, sardines, and tuna) three times a week.   Choose healthy fats.   Limit foods high in starch and sugar.  Eat more home-cooked food and less restaurant, buffet, and fast food.  Limit fried foods.  Cook foods using methods other than frying.  Limit saturated  fats.  Check ingredient lists to avoid foods with partially hydrogenated oils (trans fats) in them. WHAT FOODS CAN I EAT?  Grains Whole grains, such as whole wheat or whole grain breads, crackers, cereals, and pasta. Unsweetened oatmeal, bulgur, barley, quinoa, or brown rice. Corn or whole wheat flour tortillas.  Vegetables Fresh or frozen vegetables (raw, steamed, roasted, or grilled). Green salads. Fruits All fresh, canned (in natural juice), or frozen fruits. Meat and Other Protein Products Ground beef (85% or leaner), grass-fed beef, or beef trimmed of fat. Skinless chicken or [place]. Ground chicken or [place]. Pork trimmed of fat. All fish and seafood. Eggs. Dried beans, peas, or lentils. Unsalted nuts or seeds. Unsalted canned or dry beans. Dairy Low-fat dairy products, such as skim or 1% milk, 2% or reduced-fat cheeses, low-fat ricotta or cottage cheese, or plain low-fat yogurt. Fats and Oils Tub margarines without trans fats. Light or reduced-fat mayonnaise and salad dressings. Avocado. Safflower, olive, or canola oils. Natural peanut or almond butter. The items listed above may not be a complete list of recommended foods or beverages. Contact your dietitian for more options. WHAT FOODS ARE NOT RECOMMENDED?  Grains White bread. White pasta. White rice. Cornbread. Bagels, pastries, and croissants. Crackers that contain trans fat. Vegetables White potatoes. Corn. Creamed or fried vegetables. Vegetables in a cheese sauce. Fruits Dried fruits. Canned fruit in light or heavy syrup. Fruit juice. Meat and Other Protein Products Fatty  cuts of meat. Ribs, chicken wings, bacon, sausage, bologna, salami, chitterlings, fatback, hot dogs, bratwurst, and packaged luncheon meats. Dairy Whole or 2% milk, cream, half-and-half, and cream cheese. Whole-fat or sweetened yogurt. Full-fat cheeses. Nondairy creamers and whipped toppings. Processed cheese, cheese spreads, or cheese curds. Sweets and  Desserts Corn syrup, sugars, honey, and molasses. Candy. Jam and jelly. Syrup. Sweetened cereals. Cookies, pies, cakes, donuts, muffins, and ice cream. Fats and Oils Butter, stick margarine, lard, shortening, ghee, or bacon fat. Coconut, palm kernel, or palm oils. Beverages Alcohol. Sweetened drinks (such as sodas, lemonade, and fruit drinks or punches). The items listed above may not be a complete list of foods and beverages to avoid. Contact your dietitian for more information.   This information is not intended to replace advice given to you by your health care provider. Make sure you discuss any questions you have with your health care provider.   Document Released: 04/15/2004 Document Revised: 07/19/2014 Document Reviewed: 05/02/2013 Elsevier Interactive Patient Education [employer].

## 2016-02-10 NOTE — Assessment & Plan Note (Signed)
Strong family history of. Triglyceride level at 312. We'll have her start Fish oil 2000 mg daily with meals. Recheck triglycerides in 3-4 months. Also provided information regarding lipid-lowering diet.

## 2016-02-10 NOTE — Progress Notes (Signed)
Pre visit review using our clinic review tool, if applicable. No additional management support is needed unless otherwise documented below in the visit note. 

## 2016-02-10 NOTE — Assessment & Plan Note (Signed)
Slightly above goal in clinic today, suspect due to uncontrolled anxiety. We'll continue to monitor.

## 2016-02-10 NOTE — Assessment & Plan Note (Signed)
Tetanus due and provided today. Pap, mammogram, colonoscopy up-to-date. Will draw labs for hormone testing as she desires. Labs today with elevation in triglycerides, overall unremarkable. We'll have her start Fish oil 2000 mg daily with meals. Recheck triglycerides in 3-4 months. Also provided information regarding lipid-lowering diet. Exam mostly unremarkable with exception of hemorrhoid.  Follow-up in one year for repeat physical.

## 2016-02-10 NOTE — Assessment & Plan Note (Signed)
Little to no improvement on Belsomra but patient would like to give it another month. Long discussion today regarding etiology of insomnia for which I suspect is due to uncontrolled anxiety.  Once managed on Celexa and did well without complaints of insomnia. Will restart Celexa today at 20 mg. Patient is to take 1/2 tablet daily for 8 days, then advance to 1 full tablet thereafter. We discussed possible side effects of headache, GI upset, drowsiness, and SI/HI. If thoughts of SI/HI develop, we discussed to present to the emergency immediately. Patient verbalized understanding.   Follow-up in 2 months for reevaluation of anxiety and insomnia.

## 2016-02-10 NOTE — Progress Notes (Signed)
Subjective:    Patient ID: Michelle Figueroa, female    DOB: [birth year], 55 y.o.   MRN: [id number]  HPI  Ms. Michelle Figueroa is a 55 year old female who presents today for complete physical and to discuss insomnia and hemorrhoids.  1) Insomnia: Placed on Belsomra last visit due to need for treatment for maintenance insomnia. She was once managed on Xanax for which she took after she woke up in the middle of the night. She is failed treatment before on over-the-counter medications. She continues to wake up to 3 times nightly on Belsomra. She does have a history of generalized anxiety disorder and was once managed on Celexa with improvement in anxiety and without problems with sleep. She does endorse daily anxiety, irritability, worry. GAD 7 score of 11 today.  2) Hemorrhoids: Long history of in the past. Recently with external hemorrhoid that has been present for months. Symptoms of rectal discomfort and bleeding. She was treated by her GYN with Anusol cream without much improvement. She's once had a hemorrhoid removed per GI. Denies weakness, fatigue.  3) Hormone Testing: History of endometrial ablation and would like hormone levels tested. Denies hot flashes, vaginal dryness. She is interested to see where her levels are at this point.  Immunizations: -Tetanus: Completed last in 2007. Due today. -Influenza: Did not complete last season.  Diet: She endorses a fair diet. Breakfast: Skips, instant breakfast, breakfast bar Lunch: Sandwich, chips, occasional fast food,  Dinner: [employer], potatoes, vegetables or salad Snacks: None Desserts: None Beverages: Does not drink much, limited water, coffee  Exercise: She does not currently exercise Eye exam: Completed 1 year ago Dental exam: Completes semi-annually Colonoscopy: Completed in 2015, due again in 2020. Pap Smear:  Completed in July 2017, normal Mammogram: Completed in March 2017   Review of Systems  Constitutional: Negative for unexpected  weight change.  HENT: Negative for rhinorrhea.   Respiratory: Negative for cough and shortness of breath.   Cardiovascular: Negative for chest pain.  Gastrointestinal: Negative for constipation and diarrhea.       Occasional constipation   Genitourinary: Negative for difficulty urinating.       Uterine ablation   Musculoskeletal: Negative for arthralgias and myalgias.  Skin: Negative for rash.  Allergic/Immunologic: Negative for environmental allergies.  Neurological: Negative for dizziness, numbness and headaches.  Psychiatric/Behavioral:       Continues to wake during the night 2-3 times nightly.  Once managed on Celexa with improvement.       Past Medical History:  Diagnosis Date  . Diverticulosis    noted on colonoscopy  . Essential hypertension   . GERD (gastroesophageal reflux disease)   . Hemorrhoids   . Insomnia      Social History   Social History  . Marital status: Married    Spouse name: N/A  . Number of children: N/A  . Years of education: N/A   Occupational History  . Not on file.   Social History Main Topics  . Smoking status: Never Smoker  . Smokeless tobacco: Not on file  . Alcohol use 1.8 oz/week    3 Standard drinks or equivalent per week  . Drug use: Unknown  . Sexual activity: Not on file   Other Topics Concern  . Not on file   Social History Narrative   Married.   2 children.   Works in [employer] at [employer].    Enjoys traveling, spending time with family.     Past Surgical  History:  Procedure Laterality Date  . APPENDECTOMY  2009  . CERVICAL FUSION  2010  . CHOLECYSTECTOMY  2000    Family History  Problem Relation Age of Onset  . Arthritis Mother   . Diabetes Mother   . Lung cancer Father   . Hypertension Father     Allergies  Allergen Reactions  . Erythromycin Ethylsuccinate Rash  . Oxycodone-Acetaminophen Nausea Only    Current Outpatient Prescriptions on File Prior to Visit  Medication Sig Dispense  Refill  . ESTRACE VAGINAL 0.1 MG/GM vaginal cream Place 1 Applicatorful vaginally 3 (three) times a week.   0  . metoprolol succinate (TOPROL-XL) 50 MG 24 hr tablet Take 1 tablet (50 mg total) by mouth daily. 90 tablet 1  . omeprazole (PRILOSEC) 20 MG capsule Take 20 mg by mouth daily.     Marland Kitchen ALPRAZolam (XANAX) 0.25 MG tablet Take 0.25 mg by mouth as needed.   0   No current facility-administered medications on file prior to visit.     BP (!) 144/86   Pulse 77   Temp 98.1 F (36.7 C) (Oral)   Ht 5\' 5"  (1.651 m)   Wt 193 lb (87.5 kg)   SpO2 99%   BMI 32.12 kg/m    Objective:   Physical Exam  Constitutional: She is oriented to person, place, and time. She appears well-nourished.  HENT:  Right Ear: Tympanic membrane and ear canal normal.  Left Ear: Tympanic membrane and ear canal normal.  Nose: Nose normal.  Mouth/Throat: Oropharynx is clear and moist.  Eyes: Conjunctivae and EOM are normal. Pupils are equal, round, and reactive to light.  Neck: Neck supple. No thyromegaly present.  Cardiovascular: Normal rate and regular rhythm.   No murmur heard. Pulmonary/Chest: Effort normal and breath sounds normal. She has no rales.  Abdominal: Soft. Bowel sounds are normal. There is no tenderness.  1/2 cm, nonthrombosed hemorrhoid present externally. Small internal hemorrhoid noted upon rectal exam. No bleeding.  Musculoskeletal: Normal range of motion.  Lymphadenopathy:    She has no cervical adenopathy.  Neurological: She is alert and oriented to person, place, and time. She has normal reflexes. No cranial nerve deficit.  Skin: Skin is warm and dry. No rash noted.  Psychiatric: She has a normal mood and affect.          Assessment & Plan:

## 2016-02-10 NOTE — Assessment & Plan Note (Signed)
Long history of, once managed on Celexa and did well. Suspect anxiety is etiology for her insomnia. Will restart Celexa 20 mg today.  Patient is to take 1/2 tablet daily for 8 days, then advance to 1 full tablet thereafter. We discussed possible side effects of headache, GI upset, drowsiness, and SI/HI. If thoughts of SI/HI develop, we discussed to present to the emergency immediately. Patient verbalized understanding.   Follow-up in 2 months for reevaluation.

## 2016-02-12 ENCOUNTER — Telehealth: Payer: Self-pay | Admitting: Primary Care

## 2016-02-12 ENCOUNTER — Telehealth: Payer: Self-pay

## 2016-02-12 DIAGNOSIS — K649 Unspecified hemorrhoids: Secondary | ICD-10-CM

## 2016-02-12 MED ORDER — HYDROCORTISONE 2.5 % RE CREA: 1.0000 "application " | TOPICAL_CREAM | Freq: Two times a day (BID) | RECTAL | 0 refills | Status: DC

## 2016-02-12 NOTE — Telephone Encounter (Signed)
Pt returned call, Best number [phone]

## 2016-02-12 NOTE — Telephone Encounter (Signed)
Message left for patient to return my call.  

## 2016-02-12 NOTE — Telephone Encounter (Signed)
Spoken and notified patient of Kate's comments in result note.  Patient stated the suppository was not cover under her insurance and it would be $80 out of pocket. Patient wanted if Anda Kraft can send something else.   Noted. A prior auth was done for the suppository and was denied. Already notified patient.

## 2016-02-12 NOTE — Telephone Encounter (Signed)
Address issue in another encounter

## 2016-02-12 NOTE — Telephone Encounter (Signed)
PLEASE NOTE: All timestamps contained within this report are represented as [place] Standard Time. CONFIDENTIALTY NOTICE: This fax transmission is intended only for the addressee. It contains information that is legally privileged, confidential or otherwise protected from use or disclosure. If you are not the intended recipient, you are strictly prohibited from reviewing, disclosing, copying using or disseminating any of this information or taking any action in reliance on or regarding this information. If you have received this fax in error, please notify us immediately by telephone so that we can arrange for its return to [place]. Phone: [phone], Toll-Free: [phone], Fax: [phone] Page: 1 of 1 Call Id: [id number] [place] Night - Client Nonclinical Telephone Record [place] Night - Client Client Site [place] Physician Alma Friendly - NP Contact Type Call Who Is Calling Patient / Member / Family / Caregiver Caller Name [place] Phone Number [phone] Call Type Message Only Information Provided Reason for Call Returning a Call from the Office Initial [place] states she was speaking to [place] and the phones were transferred to the answering service. Additional Comment Caller will call office back in the morning. Call Closed By: Bonnita Nasuti Transaction Date/Time: 02/11/2016 5:15:34 PM (ET)

## 2016-02-12 NOTE — Telephone Encounter (Signed)
Noted. Prescription sent for anusol HC cream. She is to apply twice daily for 1 week or until symptoms resolved.

## 2016-02-12 NOTE — Telephone Encounter (Signed)
Call patient and asked her what type of cream was sent for her hemorrhoid before. I have idea of acream that may work better, but I need to know what she was on before.

## 2016-02-12 NOTE — Telephone Encounter (Signed)
Spoken to patient and she stated as far as she can remember nothing has been prescribed but have tried all kinds of OTC that are available.

## 2016-02-13 NOTE — Telephone Encounter (Signed)
Spoken and notified patient of Kate's comments. Patient verbalized understanding. 

## 2016-03-03 DIAGNOSIS — D1800 Hemangioma unspecified site: Secondary | ICD-10-CM | POA: Diagnosis not present

## 2016-03-03 DIAGNOSIS — Z1283 Encounter for screening for malignant neoplasm of skin: Secondary | ICD-10-CM | POA: Diagnosis not present

## 2016-03-03 DIAGNOSIS — L821 Other seborrheic keratosis: Secondary | ICD-10-CM | POA: Diagnosis not present

## 2016-03-03 DIAGNOSIS — L718 Other rosacea: Secondary | ICD-10-CM | POA: Diagnosis not present

## 2016-03-03 DIAGNOSIS — L918 Other hypertrophic disorders of the skin: Secondary | ICD-10-CM | POA: Diagnosis not present

## 2016-03-03 DIAGNOSIS — Z411 Encounter for cosmetic surgery: Secondary | ICD-10-CM | POA: Diagnosis not present

## 2016-03-08 DIAGNOSIS — K649 Unspecified hemorrhoids: Secondary | ICD-10-CM | POA: Diagnosis not present

## 2016-03-11 DIAGNOSIS — K649 Unspecified hemorrhoids: Secondary | ICD-10-CM | POA: Diagnosis not present

## 2016-03-11 DIAGNOSIS — K644 Residual hemorrhoidal skin tags: Secondary | ICD-10-CM | POA: Diagnosis not present

## 2016-04-20 ENCOUNTER — Telehealth: Payer: Self-pay | Admitting: Primary Care

## 2016-04-20 ENCOUNTER — Other Ambulatory Visit: Payer: Self-pay | Admitting: Primary Care

## 2016-04-20 NOTE — Telephone Encounter (Signed)
As mentioned before I do not support use of Xanax for insomnia. I know she previously took Ambien, was this effective in the past?

## 2016-04-20 NOTE — Telephone Encounter (Signed)
I received a refill request for Belsomra which is used for insomnia. Per our last discussion this medication was not helping with sleep. Is this still the case? Is she wanting a refill?

## 2016-04-20 NOTE — Telephone Encounter (Signed)
Spoken and notified patient of Kate's comments. Patient stated that Belsomra help her go to sleep but she always wakes up a few hours later or sometimes in a couple of hours. Patient have been taking Xanax 2 tablet of the 0.25 tablet in the last couple of nights and was able to stay asleep.   Patient stated that Belsomra is better than not having anything for sleep. Patient stated if Anda Kraft suggest something that would be fine. Please advise.

## 2016-04-20 NOTE — Telephone Encounter (Signed)
Received faxed refill request for  Suvorexant (BELSOMRA) 10 MG TABS  Last prescribed on 02/10/2016. Last seen on 02/10/2016. No future appt.

## 2016-04-21 ENCOUNTER — Telehealth: Payer: Self-pay | Admitting: Primary Care

## 2016-04-21 ENCOUNTER — Other Ambulatory Visit: Payer: Self-pay | Admitting: Primary Care

## 2016-04-21 ENCOUNTER — Encounter: Payer: Self-pay | Admitting: Primary Care

## 2016-04-21 DIAGNOSIS — G4700 Insomnia, unspecified: Secondary | ICD-10-CM

## 2016-04-21 MED ORDER — ESZOPICLONE 2 MG PO TABS: 2.0000 mg | ORAL_TABLET | Freq: Every evening | ORAL | 0 refills | Status: DC | PRN

## 2016-04-21 NOTE — Telephone Encounter (Signed)
Michelle Figueroa may be a good option, has she tried that? Also, mention that if she gets onto my chart we can discuss this further.

## 2016-04-21 NOTE — Telephone Encounter (Signed)
Noted, discussed with patient through My Chart.

## 2016-04-21 NOTE — Telephone Encounter (Signed)
Spoken to patient and stated that she have not tried [place] and agreeable to try it.  Also re-sent the code for patient to access MyChart.

## 2016-04-21 NOTE — Telephone Encounter (Signed)
Patient stated that she has tried Ambien and still wake up in the middle of the night.

## 2016-04-21 NOTE — Telephone Encounter (Signed)
Please call in Lunesta 2 mg tablets. Take 1 tablet by mouth immediately before bedtime as needed for sleep. #30, no refills.

## 2016-04-22 DIAGNOSIS — K602 Anal fissure, unspecified: Secondary | ICD-10-CM | POA: Diagnosis not present

## 2016-04-22 NOTE — Telephone Encounter (Signed)
Called in medication to the pharmacy as instructed. 

## 2016-05-24 ENCOUNTER — Other Ambulatory Visit: Payer: Self-pay | Admitting: Primary Care

## 2016-05-24 DIAGNOSIS — G4700 Insomnia, unspecified: Secondary | ICD-10-CM

## 2016-05-24 DIAGNOSIS — F411 Generalized anxiety disorder: Secondary | ICD-10-CM

## 2016-05-24 NOTE — Telephone Encounter (Signed)
Ok to refill? Electronically refill request for   citalopram (CELEXA) 20 MG tablet Last prescribed and seen on 02/10/2016.  eszopiclone (LUNESTA) 2 MG TABS tablet Last prescribed on 04/22/2016.  However, pharmacy stated 2 mg is on back order. Please send refill request 1 mg and take 2 tablets at bedtime as needed for sleep.

## 2016-05-25 MED ORDER — ESZOPICLONE 1 MG PO TABS: 1.0000 mg | ORAL_TABLET | Freq: Every evening | ORAL | 0 refills | Status: DC | PRN

## 2016-05-25 NOTE — Telephone Encounter (Signed)
Called in medication to the pharmacy as instructed. 

## 2016-05-27 ENCOUNTER — Other Ambulatory Visit: Payer: Self-pay | Admitting: Primary Care

## 2016-05-27 ENCOUNTER — Encounter: Payer: Self-pay | Admitting: Primary Care

## 2016-05-27 DIAGNOSIS — F411 Generalized anxiety disorder: Secondary | ICD-10-CM

## 2016-05-27 MED ORDER — ESCITALOPRAM OXALATE 10 MG PO TABS
10.0000 mg | ORAL_TABLET | Freq: Every day | ORAL | 1 refills | Status: DC
Start: 2016-05-27 — End: 2016-06-28

## 2016-06-08 DIAGNOSIS — H02836 Dermatochalasis of left eye, unspecified eyelid: Secondary | ICD-10-CM | POA: Diagnosis not present

## 2016-06-28 ENCOUNTER — Other Ambulatory Visit: Payer: Self-pay | Admitting: Primary Care

## 2016-06-28 DIAGNOSIS — F411 Generalized anxiety disorder: Secondary | ICD-10-CM

## 2016-06-28 DIAGNOSIS — G4700 Insomnia, unspecified: Secondary | ICD-10-CM

## 2016-06-29 MED ORDER — ESCITALOPRAM OXALATE 10 MG PO TABS: 10.0000 mg | ORAL_TABLET | Freq: Every day | ORAL | 3 refills | Status: DC

## 2016-06-29 NOTE — Telephone Encounter (Signed)
Called in medication to the pharmacy as instructed.  Spoken to patient and she stated that she is doing well on Lexapro 10 mg.

## 2016-06-29 NOTE — Addendum Note (Signed)
Addended by: Jacqualin Combes on: 06/29/2016 08:35 AM   Modules accepted: Orders

## 2016-06-30 ENCOUNTER — Encounter: Payer: Self-pay | Admitting: Primary Care

## 2016-07-10 ENCOUNTER — Other Ambulatory Visit: Payer: Self-pay | Admitting: Primary Care

## 2016-07-10 DIAGNOSIS — I10 Essential (primary) hypertension: Secondary | ICD-10-CM

## 2016-07-13 ENCOUNTER — Telehealth: Payer: Self-pay | Admitting: Primary Care

## 2016-07-13 DIAGNOSIS — H5340 Unspecified visual field defects: Secondary | ICD-10-CM | POA: Diagnosis not present

## 2016-07-13 DIAGNOSIS — Z6829 Body mass index (BMI) 29.0-29.9, adult: Secondary | ICD-10-CM | POA: Diagnosis not present

## 2016-07-13 DIAGNOSIS — H02839 Dermatochalasis of unspecified eye, unspecified eyelid: Secondary | ICD-10-CM | POA: Diagnosis not present

## 2016-07-13 DIAGNOSIS — L908 Other atrophic disorders of skin: Secondary | ICD-10-CM | POA: Diagnosis not present

## 2016-07-13 NOTE — Telephone Encounter (Signed)
Patient Name: Michelle Figueroa Gender: Female DOB: Mar 02, 1961 Age: 56 Y 2 M Return Phone Number: [phone] (Primary), [phone] (Secondary) Address: City/State/Zip: [place] Night - Client Client Site [place] Physician Alma Friendly - NP Contact Type Call Who Is Calling Patient / Member / Family / Caregiver Call Type Triage / Clinical Relationship To Patient Self Return Phone Number [phone] (Primary) Chief Complaint Prescription Refill or Medication Request (non symptomatic) Reason for Call Medication Question / Request Initial Comment Caller states she takes BP medication Metoprolol and the caller is completely out. Can the pt gp w/O BP meds for three days? Translation No Nurse Assessment Nurse: Wynetta Emery, RN, Baker Janus Date/Time Eilene Ghazi Time): 07/11/2016 9:32:59 AM Confirm and document reason for call. If symptomatic, describe symptoms. ---Mitchell Heir was given a loaner dose of her Metroprolol 50mg  po one tablet daily NEEDS this called in to the pharmacy as soon as the office opens up [place].!! Does the patient have any new or worsening symptoms? ---No Please document clinical information provided and list any resource used. ---Nurse advised Tierria she will document and get this taken care of when the office reopens for business. Guidelines Guideline Title Affirmed Question Affirmed Notes Nurse Date/Time (Eastern Time) Disp. Time Eilene Ghazi Time) Disposition Final User 07/10/2016 3:30:30 PM Send To Extended Follow Up Dimas Chyle, RNLevada Dy 07/11/2016 8:53:25 AM Attempt made - message left Ivin Booty 07/11/2016 9:35:14 AM Clinical Call Yes Wynetta Emery, RN, Baker Janus

## 2016-07-13 NOTE — Telephone Encounter (Signed)
Ok to refill? Electronically refill request for   metoprolol succinate (TOPROL-XL) 50 MG 24 hr tablet  Last prescribed on 11/10/2015. Last seen on 02/10/2016.

## 2016-07-13 NOTE — Telephone Encounter (Signed)
Refill sent. Note that refill request was not received until today.

## 2016-07-16 ENCOUNTER — Ambulatory Visit (INDEPENDENT_AMBULATORY_CARE_PROVIDER_SITE_OTHER): Payer: BLUE CROSS/BLUE SHIELD | Admitting: Primary Care

## 2016-07-16 VITALS — BP 124/86 | HR 69 | Temp 98.3°F | Ht 65.0 in | Wt 198.4 lb

## 2016-07-16 DIAGNOSIS — K219 Gastro-esophageal reflux disease without esophagitis: Secondary | ICD-10-CM | POA: Diagnosis not present

## 2016-07-16 DIAGNOSIS — R350 Frequency of micturition: Secondary | ICD-10-CM | POA: Diagnosis not present

## 2016-07-16 DIAGNOSIS — I10 Essential (primary) hypertension: Secondary | ICD-10-CM

## 2016-07-16 DIAGNOSIS — Z1231 Encounter for screening mammogram for malignant neoplasm of breast: Secondary | ICD-10-CM

## 2016-07-16 DIAGNOSIS — F411 Generalized anxiety disorder: Secondary | ICD-10-CM

## 2016-07-16 DIAGNOSIS — Z1239 Encounter for other screening for malignant neoplasm of breast: Secondary | ICD-10-CM

## 2016-07-16 LAB — POC URINALSYSI DIPSTICK (AUTOMATED)
BILIRUBIN UA: NEGATIVE
Glucose, UA: NEGATIVE
Ketones, UA: NEGATIVE
LEUKOCYTES UA: NEGATIVE
NITRITE UA: NEGATIVE
PH UA: 5.5
PROTEIN UA: NEGATIVE
RBC UA: NEGATIVE
Spec Grav, UA: >=1.03
UROBILINOGEN UA: NEGATIVE

## 2016-07-16 MED ORDER — OMEPRAZOLE 20 MG PO CPDR: 20.0000 mg | DELAYED_RELEASE_CAPSULE | Freq: Every day | ORAL | 1 refills | Status: DC

## 2016-07-16 MED ORDER — ESCITALOPRAM OXALATE 20 MG PO TABS: 20.0000 mg | ORAL_TABLET | Freq: Every day | ORAL | 0 refills | Status: DC

## 2016-07-16 MED ORDER — METOPROLOL SUCCINATE ER 50 MG PO TB24: 50.0000 mg | ORAL_TABLET | Freq: Every day | ORAL | 1 refills | Status: DC

## 2016-07-16 NOTE — Progress Notes (Signed)
Subjective:    Patient ID: Michelle Figueroa, female    DOB: 08-28-1960, 56 y.o.   MRN: [id number]  HPI  Michelle Figueroa is a 56 year old female who presents today for follow up of GAD. She is currently managed on Lexapro 10 mg. This was switched in November 2017 from Celexa as it caused her to feel drowsy. She sent an e-mail on 12/20 requesting an increase in her Lexapro dose. She was encouraged to follow up in our office to discuss.  She would like to increase the dose as she feels like she's not quite at goal. She was on 20 mg in the past and did well. She's currently experiencing symptoms of obsessive thinking, daily worry. The irritability and anxiety has improved. She's not as drowsy on the Lexapro. She denies SI/HI.  2) Essential Hypertension: Currently managed on Lisinopril 10 mg and Metoprolol XL 50. She would like to eventually come off of her medications as she's motivated to lose weight. She thinks the Metoprolol may be inhibiting her weight loss efforts. She denies palpitations, chest pain, dizziness. Her BP in the office today is 124/86.  3) Urinary Frequency: Also with pelvic pressure. She was evaluated and treated for a urinary tract infection 4 days ago through her clinic at work. She was treated with a three day course of Cipro. She's feeling better overall but continues to notice pelvic pressure and difficulty urinating. She denies dysuria, hematuria, vaginal symptoms.  Review of Systems  Eyes: Negative for visual disturbance.  Cardiovascular: Negative for chest pain.  Genitourinary: Positive for frequency. Negative for pelvic pain and vaginal discharge.       Pelvic pressure  Neurological: Negative for dizziness and weakness.  Psychiatric/Behavioral: Negative for sleep disturbance. The patient is nervous/anxious.        Past Medical History:  Diagnosis Date  . Diverticulosis    noted on colonoscopy  . Essential hypertension   . GERD (gastroesophageal reflux disease)   .  Hemorrhoids   . Insomnia      Social History   Social History  . Marital status: Married    Spouse name: N/A  . Number of children: N/A  . Years of education: N/A   Occupational History  . Not on file.   Social History Main Topics  . Smoking status: Never Smoker  . Smokeless tobacco: Not on file  . Alcohol use 1.8 oz/week    3 Standard drinks or equivalent per week  . Drug use: Unknown  . Sexual activity: Not on file   Other Topics Concern  . Not on file   Social History Narrative   Married.   2 children.   Works in [employer] at [employer].    Enjoys traveling, spending time with family.     Past Surgical History:  Procedure Laterality Date  . APPENDECTOMY  2009  . CERVICAL FUSION  2010  . CHOLECYSTECTOMY  2000    Family History  Problem Relation Age of Onset  . Arthritis Mother   . Diabetes Mother   . Lung cancer Father   . Hypertension Father     Allergies  Allergen Reactions  . Erythromycin Ethylsuccinate Rash  . Oxycodone-Acetaminophen Nausea Only    Current Outpatient Prescriptions on File Prior to Visit  Medication Sig Dispense Refill  . ESTRACE VAGINAL 0.1 MG/GM vaginal cream Place 1 Applicatorful vaginally 3 (three) times a week.   0  . eszopiclone (LUNESTA) 1 MG TABS tablet TAKE 1 TO 2  TABLETS BY MOUTH AT BEDTIME AS NEEDED FOR SLEEP. TAKE IMMEDIATELY BEFORE BED 180 tablet 0  . hydrocortisone (ANUSOL-HC) 2.5 % rectal cream Place 1 application rectally 2 (two) times daily. 30 g 0  . lisinopril (PRINIVIL,ZESTRIL) 10 MG tablet Take 1 tablet (10 mg total) by mouth daily. 90 tablet 3   No current facility-administered medications on file prior to visit.     BP 124/86   Pulse 69   Temp 98.3 F (36.8 C) (Oral)   Ht 5\' 5"  (1.651 m)   Wt 198 lb 6.4 oz (90 kg)   SpO2 96%   BMI 33.02 kg/m    Objective:   Physical Exam  Constitutional: She appears well-nourished.  Neck: Neck supple.  Cardiovascular: Normal rate and regular rhythm.     Pulmonary/Chest: Effort normal and breath sounds normal.  Abdominal: Soft. There is no tenderness. There is no CVA tenderness.  Skin: Skin is warm and dry.  Psychiatric: She has a normal mood and affect.          Assessment & Plan:  Pelvic Pressure/Urinary Freuqnecy:  Overall improved since treated with Cipro. Minor symptoms remain. UA today unremarkable. Discussed to push fluids and notify if symptoms progress.  Sheral Flow, NP

## 2016-07-16 NOTE — Assessment & Plan Note (Signed)
Improved on Lexapro, doesn't seem to be quite at goal. Increase dose to 20 mg. Will follow up with her in 4 weeks to determine if effective. Denies SI/HI.

## 2016-07-16 NOTE — Assessment & Plan Note (Signed)
Stable today. Agreeable to wean her off Metoprolol if she can lose weight and improve her diet. Discussed to never stop this medication and to e-mail when ready to wean off. Refill provided today.

## 2016-07-16 NOTE — Progress Notes (Signed)
Pre visit review using our clinic review tool, if applicable. No additional management support is needed unless otherwise documented below in the visit note. 

## 2016-07-16 NOTE — Patient Instructions (Signed)
We've increased your dose of Lexapro from 10 mg to 20 mg. You may take two of the 10 mg tablets until your current bottle is empty.  Please e-mail me in 4 weeks with an update on the new dose.  I sent refills for metoprolol and omeprazole to your pharmacy. Please notify me when you are ready to wean off of metoprolol.  Your urine looks normal. Ensure you are staying hydrated with water.  It was a pleasure to see you today!

## 2016-07-16 NOTE — Assessment & Plan Note (Signed)
Stable, refill provided today. Discussed weight loss.

## 2016-07-26 ENCOUNTER — Ambulatory Visit (INDEPENDENT_AMBULATORY_CARE_PROVIDER_SITE_OTHER): Payer: BLUE CROSS/BLUE SHIELD | Admitting: Primary Care

## 2016-07-26 ENCOUNTER — Encounter: Payer: Self-pay | Admitting: Primary Care

## 2016-07-26 ENCOUNTER — Other Ambulatory Visit (HOSPITAL_COMMUNITY)
Admission: RE | Admit: 2016-07-26 | Discharge: 2016-07-26 | Disposition: A | Discharge status: Home / Self Care | Payer: BLUE CROSS/BLUE SHIELD | Source: Ambulatory Visit | Attending: Primary Care | Admitting: Primary Care

## 2016-07-26 VITALS — BP 122/80 | HR 83 | Temp 98.3°F | Ht 65.0 in | Wt 198.8 lb

## 2016-07-26 DIAGNOSIS — Z23 Encounter for immunization: Secondary | ICD-10-CM | POA: Diagnosis not present

## 2016-07-26 DIAGNOSIS — Z1151 Encounter for screening for human papillomavirus (HPV): Secondary | ICD-10-CM | POA: Diagnosis not present

## 2016-07-26 DIAGNOSIS — Z124 Encounter for screening for malignant neoplasm of cervix: Secondary | ICD-10-CM

## 2016-07-26 DIAGNOSIS — Z01419 Encounter for gynecological examination (general) (routine) without abnormal findings: Secondary | ICD-10-CM | POA: Insufficient documentation

## 2016-07-26 NOTE — Progress Notes (Signed)
Pre visit review using our clinic review tool, if applicable. No additional management support is needed unless otherwise documented below in the visit note. 

## 2016-07-26 NOTE — Progress Notes (Signed)
Subjective:    Patient ID: Michelle Figueroa, female    DOB: [birth year], 56 y.o.   MRN: [id number]  HPI  Michelle Figueroa is a 56 year old female who presents today for a pap and flu shot.   She is currently managed on estrace vaginal cream which she uses infrequently for vaginal dryness. She denies vaginal discharge, itching, urinary symptoms, fatigue, vaginal bleeding. She underwent uterine ablation. She has her mammogram scheduled for the near future.  Review of Systems  Constitutional: Negative for unexpected weight change.  Genitourinary: Negative for difficulty urinating, dysuria, pelvic pain, vaginal bleeding and vaginal discharge.  Neurological: Negative for weakness.       Past Medical History:  Diagnosis Date  . Diverticulosis    noted on colonoscopy  . Essential hypertension   . GERD (gastroesophageal reflux disease)   . Hemorrhoids   . Insomnia      Social History   Social History  . Marital status: Married    Spouse name: N/A  . Number of children: N/A  . Years of education: N/A   Occupational History  . Not on file.   Social History Main Topics  . Smoking status: Never Smoker  . Smokeless tobacco: Not on file  . Alcohol use 1.8 oz/week    3 Standard drinks or equivalent per week  . Drug use: Unknown  . Sexual activity: Not on file   Other Topics Concern  . Not on file   Social History Narrative   Married.   2 children.   Works in [employer] at [employer].    Enjoys traveling, spending time with family.     Past Surgical History:  Procedure Laterality Date  . APPENDECTOMY  2009  . CERVICAL FUSION  2010  . CHOLECYSTECTOMY  2000    Family History  Problem Relation Age of Onset  . Arthritis Mother   . Diabetes Mother   . Lung cancer Father   . Hypertension Father     Allergies  Allergen Reactions  . Erythromycin Ethylsuccinate Rash  . Oxycodone-Acetaminophen Nausea Only    Current Outpatient Prescriptions on File Prior to  Visit  Medication Sig Dispense Refill  . escitalopram (LEXAPRO) 20 MG tablet Take 1 tablet (20 mg total) by mouth daily. 90 tablet 0  . ESTRACE VAGINAL 0.1 MG/GM vaginal cream Place 1 Applicatorful vaginally 3 (three) times a week.   0  . eszopiclone (LUNESTA) 1 MG TABS tablet TAKE 1 TO 2 TABLETS BY MOUTH AT BEDTIME AS NEEDED FOR SLEEP. TAKE IMMEDIATELY BEFORE BED 180 tablet 0  . hydrocortisone (ANUSOL-HC) 2.5 % rectal cream Place 1 application rectally 2 (two) times daily. 30 g 0  . lisinopril (PRINIVIL,ZESTRIL) 10 MG tablet Take 1 tablet (10 mg total) by mouth daily. 90 tablet 3  . metoprolol succinate (TOPROL-XL) 50 MG 24 hr tablet Take 1 tablet (50 mg total) by mouth daily. Take with or immediately following a meal. 90 tablet 1  . omeprazole (PRILOSEC) 20 MG capsule Take 1 capsule (20 mg total) by mouth daily. 90 capsule 1   No current facility-administered medications on file prior to visit.     BP 122/80   Pulse 83   Temp 98.3 F (36.8 C) (Oral)   Ht 5\' 5"  (1.651 m)   Wt 198 lb 12.8 oz (90.2 kg)   SpO2 98%   BMI 33.08 kg/m    Objective:   Physical Exam  Neck: Neck supple.  Cardiovascular: Normal rate and  regular rhythm.   Pulmonary/Chest: Effort normal and breath sounds normal.  Genitourinary: There is no lesion on the right labia. There is no lesion on the left labia. Cervix exhibits no motion tenderness and no discharge. Right adnexum displays no tenderness. Left adnexum displays no tenderness. No vaginal discharge found.  Skin: Skin is warm and dry.          Assessment & Plan:  Screening for Cervical Cancer:  Prefers annual paps. Discussed current guidelines of every 3-5 years for normal pap history, she would like to proceed. Exam today unremarkable. Pap results pending.   Sheral Flow, NP

## 2016-07-26 NOTE — Patient Instructions (Signed)
We will start weaning you off of your metoprolol succinate medication slowly.  Start taking 1 tablet by mouth every other day for 1 week, then every 2 days for two weeks, then stop.  Continue to monitor your blood pressure, especially after you've completely weaned off of the medication. Notify me if you start to see numbers at or above 140/90 and/or if you develop palpitations.  I'll call and check on your readings in 1 month.  We will notify you of your pap results once received.  It was a pleasure to see you today!

## 2016-07-29 LAB — CYTOLOGY - PAP
Adequacy: ABSENT
Diagnosis: NEGATIVE
HPV: NOT DETECTED

## 2016-08-13 ENCOUNTER — Telehealth: Payer: Self-pay | Admitting: Primary Care

## 2016-08-13 NOTE — Telephone Encounter (Signed)
-----   Message from Pleas Koch, NP sent at 07/16/2016  3:15 PM EST ----- Regarding: Lexapro Dose Please check on patient since we increased her Lexapro dose to 20 mg.

## 2016-08-13 NOTE — Telephone Encounter (Signed)
Message left for patient to return my call.  

## 2016-08-16 NOTE — Telephone Encounter (Signed)
Spoken to patient and she stated that she is doing fine but wanted to let Michelle Figueroa know that she feeling like she having hot flashes at time since the increased dosage. It is not that bothersome right now. Patient stated if it becomes a problem she will let us know. However, overall okay.

## 2016-08-16 NOTE — Telephone Encounter (Signed)
Noted  

## 2016-09-10 ENCOUNTER — Ambulatory Visit (INDEPENDENT_AMBULATORY_CARE_PROVIDER_SITE_OTHER): Payer: BLUE CROSS/BLUE SHIELD | Admitting: Primary Care

## 2016-09-10 VITALS — BP 124/82 | HR 65 | Temp 98.5°F | Ht 65.0 in | Wt 201.0 lb

## 2016-09-10 DIAGNOSIS — I10 Essential (primary) hypertension: Secondary | ICD-10-CM | POA: Diagnosis not present

## 2016-09-10 MED ORDER — LISINOPRIL 20 MG PO TABS: 20.0000 mg | ORAL_TABLET | Freq: Every day | ORAL | 0 refills | Status: DC

## 2016-09-10 NOTE — Assessment & Plan Note (Signed)
Readings elevated after weaning off metoprolol. Suspect metoprolol to be causing sluggish/fatigued feeling. Will wean back off of metoprolol, increase lisinopril to 20 mg. Stressed the importance of monitoring both BP and HR.  Follow up in 3 weeks for re-evaluation and BMP.

## 2016-09-10 NOTE — Progress Notes (Signed)
Subjective:    Patient ID: Michelle Figueroa, female    DOB: 1961-01-14, 56 y.o.   MRN: [id number]  HPI  Michelle Figueroa is a 56 year old female who presents today to discuss medication.   1) Essential Hypertension: Currently managed on Toprol XL 50 mg and Lisinopril 10 mg. She had voiced a goal of coming off of anti-hypertensives on numerous visits as the metoprolol was causing drowsiness, fatigue. She was weaned off of her Metoprolol in January. She has not kept track of her BP readings as discussed but does remember some readings of 140/90's. Her BP in the office today is 124/82 and she is back on metoprolol with lisinopril.   She underwent upper eyelid surgery, bilaterally, in [place] in early February 2018. Her BP was 170's/100's on initial exam before that procedure. She forgot to take her Lisinopril that morning. Her eye doctor immediately called in the Metoprolol and Lisinopril that morning, so she took both Lisinopril and Metoprolol in the office. She was also provided with three 5 mg tablets of Valium before her procedure with improvement in blood pressure.   She felt full of energy, less fatigue, less drowsiness when off of metoprolol. Since restarting the metoprolol she's felt fatigued, drowsy, able to fall asleep at any minute, feeling foggy. She doesn't remember having any episodes of palpitations when off of metoprolol. She would like to come off again. She denies chest pain, visual changes, dizziness.  Review of Systems  Constitutional:       Sluggish, fatigued  Eyes: Negative for visual disturbance.  Respiratory: Negative for shortness of breath.   Cardiovascular: Negative for chest pain.  Neurological: Negative for dizziness.       Past Medical History:  Diagnosis Date  . Diverticulosis    noted on colonoscopy  . Essential hypertension   . GERD (gastroesophageal reflux disease)   . Hemorrhoids   . Insomnia      Social History   Social History  . Marital status:  Married    Spouse name: N/A  . Number of children: N/A  . Years of education: N/A   Occupational History  . Not on file.   Social History Main Topics  . Smoking status: Never Smoker  . Smokeless tobacco: Not on file  . Alcohol use 1.8 oz/week    3 Standard drinks or equivalent per week  . Drug use: Unknown  . Sexual activity: Not on file   Other Topics Concern  . Not on file   Social History Narrative   Married.   2 children.   Works in [employer] at [employer].    Enjoys traveling, spending time with family.     Past Surgical History:  Procedure Laterality Date  . APPENDECTOMY  2009  . CERVICAL FUSION  2010  . CHOLECYSTECTOMY  2000    Family History  Problem Relation Age of Onset  . Arthritis Mother   . Diabetes Mother   . Lung cancer Father   . Hypertension Father     Allergies  Allergen Reactions  . Erythromycin Ethylsuccinate Rash  . Oxycodone-Acetaminophen Nausea Only    Current Outpatient Prescriptions on File Prior to Visit  Medication Sig Dispense Refill  . escitalopram (LEXAPRO) 20 MG tablet Take 1 tablet (20 mg total) by mouth daily. 90 tablet 0  . ESTRACE VAGINAL 0.1 MG/GM vaginal cream Place 1 Applicatorful vaginally 3 (three) times a week.   0  . eszopiclone (LUNESTA) 1 MG TABS tablet TAKE  1 TO 2 TABLETS BY MOUTH AT BEDTIME AS NEEDED FOR SLEEP. TAKE IMMEDIATELY BEFORE BED 180 tablet 0  . hydrocortisone (ANUSOL-HC) 2.5 % rectal cream Place 1 application rectally 2 (two) times daily. 30 g 0  . omeprazole (PRILOSEC) 20 MG capsule Take 1 capsule (20 mg total) by mouth daily. 90 capsule 1   No current facility-administered medications on file prior to visit.     BP 124/82   Pulse 65   Temp 98.5 F (36.9 C) (Oral)   Ht 5\' 5"  (1.651 m)   Wt 201 lb (91.2 kg)   SpO2 98%   BMI 33.45 kg/m    Objective:   Physical Exam  Constitutional: She appears well-nourished.  Neck: Neck supple.  Cardiovascular: Normal rate, regular rhythm and  normal heart sounds.   Pulmonary/Chest: Effort normal and breath sounds normal.  Skin: Skin is warm and dry.          Assessment & Plan:

## 2016-09-10 NOTE — Progress Notes (Signed)
Pre visit review using our clinic review tool, if applicable. No additional management support is needed unless otherwise documented below in the visit note. 

## 2016-09-10 NOTE — Patient Instructions (Signed)
We've increased the dose of your Lisinopril from 10 mg to 20 mg. You may take two of your 10 mg tablets until your current bottle is empty.   Wean off of metoprolol. Start by taking 1 tablet by mouth every other day for 1 week, then take 1 tablet every 2 days for 8 days, then stop.   Check your blood pressure and HR daily, around the same time of day, for the next 3 weeks.  Ensure that you have rested for 30 minutes prior to checking your blood pressure. Record your readings and bring them to your next visit.  Schedule a follow up visit in 3 weeks for re-evaluation your blood pressure.  It was a pleasure to see you today!

## 2016-09-20 ENCOUNTER — Encounter: Payer: Self-pay | Admitting: Primary Care

## 2016-09-20 DIAGNOSIS — L700 Acne vulgaris: Secondary | ICD-10-CM | POA: Diagnosis not present

## 2016-09-20 DIAGNOSIS — L719 Rosacea, unspecified: Secondary | ICD-10-CM | POA: Diagnosis not present

## 2016-09-20 DIAGNOSIS — L820 Inflamed seborrheic keratosis: Secondary | ICD-10-CM | POA: Diagnosis not present

## 2016-09-20 DIAGNOSIS — Z79899 Other long term (current) drug therapy: Secondary | ICD-10-CM | POA: Diagnosis not present

## 2016-09-20 DIAGNOSIS — L720 Epidermal cyst: Secondary | ICD-10-CM | POA: Diagnosis not present

## 2016-09-29 ENCOUNTER — Other Ambulatory Visit: Payer: Self-pay | Admitting: Primary Care

## 2016-09-29 DIAGNOSIS — G4700 Insomnia, unspecified: Secondary | ICD-10-CM

## 2016-09-29 MED ORDER — ESZOPICLONE 1 MG PO TABS: ORAL_TABLET | ORAL | 0 refills | Status: DC

## 2016-09-29 NOTE — Telephone Encounter (Signed)
Ok to refill? Electronically refill request for eszopiclone (LUNESTA) 1 MG TABS tablet. Last prescribed on 06/28/2017. Last seen on 09/10/2016

## 2016-09-30 NOTE — Telephone Encounter (Signed)
Rx called in to requested pharmacy 

## 2016-10-04 ENCOUNTER — Other Ambulatory Visit: Payer: Self-pay

## 2016-10-04 ENCOUNTER — Emergency Department
Admission: EM | Admit: 2016-10-04 | Discharge: 2016-10-04 | Disposition: A | Discharge status: Home / Self Care | Payer: BLUE CROSS/BLUE SHIELD | Attending: Emergency Medicine | Admitting: Emergency Medicine

## 2016-10-04 ENCOUNTER — Emergency Department: Payer: BLUE CROSS/BLUE SHIELD

## 2016-10-04 ENCOUNTER — Ambulatory Visit: Payer: BLUE CROSS/BLUE SHIELD | Admitting: Medical

## 2016-10-04 ENCOUNTER — Encounter: Payer: Self-pay | Admitting: Medical

## 2016-10-04 ENCOUNTER — Encounter: Payer: Self-pay | Admitting: Emergency Medicine

## 2016-10-04 VITALS — BP 135/88 | HR 100 | Temp 98.8°F | Resp 16 | Wt 197.0 lb

## 2016-10-04 DIAGNOSIS — R072 Precordial pain: Secondary | ICD-10-CM | POA: Insufficient documentation

## 2016-10-04 DIAGNOSIS — R079 Chest pain, unspecified: Secondary | ICD-10-CM

## 2016-10-04 DIAGNOSIS — R945 Abnormal results of liver function studies: Secondary | ICD-10-CM | POA: Diagnosis not present

## 2016-10-04 DIAGNOSIS — R0602 Shortness of breath: Secondary | ICD-10-CM | POA: Diagnosis not present

## 2016-10-04 DIAGNOSIS — R1013 Epigastric pain: Secondary | ICD-10-CM | POA: Diagnosis not present

## 2016-10-04 DIAGNOSIS — I10 Essential (primary) hypertension: Secondary | ICD-10-CM | POA: Diagnosis not present

## 2016-10-04 DIAGNOSIS — R7989 Other specified abnormal findings of blood chemistry: Secondary | ICD-10-CM | POA: Diagnosis not present

## 2016-10-04 LAB — CBC
HEMATOCRIT: 44.9 % (ref 35.0–47.0)
HEMOGLOBIN: 15.1 g/dL (ref 12.0–16.0)
MCH: 29.7 pg (ref 26.0–34.0)
MCHC: 33.7 g/dL (ref 32.0–36.0)
MCV: 88.2 fL (ref 80.0–100.0)
Platelets: 232 10*3/uL (ref 150–440)
RBC: 5.09 MIL/uL (ref 3.80–5.20)
RDW: 13.8 % (ref 11.5–14.5)
WBC: 6.7 10*3/uL (ref 3.6–11.0)

## 2016-10-04 LAB — BASIC METABOLIC PANEL
Anion gap: 8 (ref 5–15)
BUN: 16 mg/dL (ref 6–20)
CO2: 27 mmol/L (ref 22–32)
Calcium: 9.9 mg/dL (ref 8.9–10.3)
Chloride: 101 mmol/L (ref 101–111)
Creatinine, Ser: 0.87 mg/dL (ref 0.44–1.00)
Glucose, Bld: 106 mg/dL — ABNORMAL HIGH (ref 65–99)
Potassium: 4.7 mmol/L (ref 3.5–5.1)
Sodium: 136 mmol/L (ref 135–145)

## 2016-10-04 LAB — FIBRIN DERIVATIVES D-DIMER (ARMC ONLY): FIBRIN DERIVATIVES D-DIMER (ARMC): 166.09 (ref 0.00–499.00)

## 2016-10-04 LAB — TROPONIN I
Troponin I: <0.03 ng/mL (ref <0.03)
Troponin I: <0.03 ng/mL (ref <0.03)

## 2016-10-04 LAB — HEPATIC FUNCTION PANEL
ALK PHOS: 104 U/L (ref 38–126)
ALT: 80 U/L — ABNORMAL HIGH (ref 14–54)
AST: 72 U/L — ABNORMAL HIGH (ref 15–41)
Albumin: 4.3 g/dL (ref 3.5–5.0)
BILIRUBIN INDIRECT: 0.9 mg/dL (ref 0.3–0.9)
Bilirubin, Direct: 0.1 mg/dL (ref 0.1–0.5)
TOTAL PROTEIN: 7.6 g/dL (ref 6.5–8.1)
Total Bilirubin: 1 mg/dL (ref 0.3–1.2)

## 2016-10-04 LAB — LIPASE, BLOOD: LIPASE: 28 U/L (ref 11–51)

## 2016-10-04 NOTE — ED Notes (Signed)
IV removed out of left ac Lm edt

## 2016-10-04 NOTE — ED Provider Notes (Signed)
[facility] Emergency Department Provider Note  ____________________________________________   First MD Initiated Contact with Patient 10/04/16 1607     (approximate)  I have reviewed the triage vital signs and the nursing notes.   HISTORY  Chief Complaint Chest Pain    HPI Michelle Figueroa is a 56 y.o. female with no significant or relevant past medical history (although she has been told her blood pressure is borderline she is not on any medications) who presents by private vehicle for evaluation of acute onset chest painyesterday.  Initially she thought it was her usual GERD but it has not improved.  It is located substernally in the center of her chest at the bottom of the sternum.  Nothing makes it better and nothing makes it worse.  At times she has had some mild shortness of breath and nausea with the pain.  She states that she thinks at one point it was radiating into her left arm but she is not sure.  She denies lightheadedness, dizziness, numbness/weakness in any of her extremities, abdominal pain, dysuria.  Her father and her brother both had ACS/MIs in their late 30s or 60s.  She does not have a history of diabetes, cholesterol (although her problem list includes hypertriglyceridemia), nor of tobacco use.  She has not been on any long trips recently, has no history of blood clots in her legs or lungs, does not take exogenous estrogen, does not have cancer.  She had her gallbladder removed about 13 years ago and her symptoms today are not made better nor worse after eating although when the chest pain began it was shortly after eating a meal.   Past Medical History:  Diagnosis Date  . Diverticulosis    noted on colonoscopy  . Essential hypertension   . GERD (gastroesophageal reflux disease)   . Hemorrhoids   . Insomnia     Patient Active Problem List   Diagnosis Date Noted  . Hemorrhoid 02/10/2016  . Preventative health care 02/10/2016  .  Hypertriglyceridemia 02/10/2016  . GAD (generalized anxiety disorder) 02/10/2016  . Essential hypertension 11/10/2015  . Insomnia 11/10/2015  . Obesity (BMI 30.0-34.9) 11/10/2015  . GERD (gastroesophageal reflux disease) 11/10/2015    Past Surgical History:  Procedure Laterality Date  . APPENDECTOMY  2009  . CERVICAL FUSION  2010  . CHOLECYSTECTOMY  2000    Prior to Admission medications   Medication Sig Start Date End Date Taking? Authorizing Provider  escitalopram (LEXAPRO) 20 MG tablet Take 1 tablet (20 mg total) by mouth daily. 07/16/16   Pleas Koch, NP  ESTRACE VAGINAL 0.1 MG/GM vaginal cream Place 1 Applicatorful vaginally 3 (three) times a week.  01/10/15   Historical Provider, MD  eszopiclone (LUNESTA) 1 MG TABS tablet Take 1-2 tablets by mouth at bedtime as needed for sleep. 09/29/16   Pleas Koch, NP  hydrocortisone (ANUSOL-HC) 2.5 % rectal cream Place 1 application rectally 2 (two) times daily. 02/12/16   Pleas Koch, NP  lisinopril (PRINIVIL,ZESTRIL) 20 MG tablet Take 1 tablet (20 mg total) by mouth daily. 09/10/16   Pleas Koch, NP  omeprazole (PRILOSEC) 20 MG capsule Take 1 capsule (20 mg total) by mouth daily. 07/16/16   Pleas Koch, NP  spironolactone (ALDACTONE) 25 MG tablet Take 25 mg by mouth daily.    Historical Provider, MD    Allergies Erythromycin ethylsuccinate and Oxycodone-acetaminophen  Family History  Problem Relation Age of Onset  . Arthritis Mother   .  Diabetes Mother   . Lung cancer Father   . Hypertension Father     Social History Social History  Substance Use Topics  . Smoking status: Never Smoker  . Smokeless tobacco: Never Used  . Alcohol use 1.8 oz/week    3 Standard drinks or equivalent per week    Review of Systems Constitutional: No fever/chills Eyes: No visual changes. ENT: No sore throat. Cardiovascular: Substernal chest pain at the bottom of her sternum Respiratory: Denies shortness of  breath. Gastrointestinal: No abdominal pain.  No nausea, no vomiting.  No diarrhea.  No constipation. Genitourinary: Negative for dysuria. Musculoskeletal: Negative for back pain. Skin: Negative for rash. Neurological: Negative for headaches, focal weakness or numbness.  10-point ROS otherwise negative.  ____________________________________________   PHYSICAL EXAM:  VITAL SIGNS: ED Triage Vitals  Enc Vitals Group     BP 10/04/16 1227 (!) 153/91     Pulse Rate 10/04/16 1227 (!) 106     Resp 10/04/16 1227 18     Temp 10/04/16 1227 97.6 F (36.4 C)     Temp Source 10/04/16 1227 Oral     SpO2 10/04/16 1227 97 %     Weight 10/04/16 1209 197 lb (89.4 kg)     Height 10/04/16 1209 5\' 6"  (1.676 m)     Head Circumference --      Peak Flow --      Pain Score 10/04/16 1209 4     Pain Loc --      Pain Edu? --      Excl. in [place]? --     Constitutional: Alert and oriented. Well appearing and in no acute distress. Eyes: Conjunctivae are normal. PERRL. EOMI. Head: Atraumatic. Nose: No congestion/rhinnorhea. Mouth/Throat: Mucous membranes are moist. Neck: No stridor.  No meningeal signs.   Cardiovascular: Normal rate, regular rhythm. Good peripheral circulation. Grossly normal heart sounds.No reproducible chest wall tenderness Respiratory: Normal respiratory effort.  No retractions. Lungs CTAB. Gastrointestinal: Soft and nontender Including no tenderness to palpation of the epigastrium or right upper quadrant Musculoskeletal: No lower extremity tenderness nor edema. No gross deformities of extremities. Neurologic:  Normal speech and language. No gross focal neurologic deficits are appreciated.  Skin:  Skin is warm, dry and intact. No rash noted. Psychiatric: Mood and affect are normal. Speech and behavior are normal.  ____________________________________________   LABS (all labs ordered are listed, but only abnormal results are displayed)  Labs Reviewed  BASIC METABOLIC PANEL -  Abnormal; Notable for the following:       Result Value   Glucose, Bld 106 (*)    All other components within normal limits  HEPATIC FUNCTION PANEL - Abnormal; Notable for the following:    AST 72 (*)    ALT 80 (*)    All other components within normal limits  CBC  TROPONIN I  LIPASE, BLOOD  TROPONIN I  FIBRIN DERIVATIVES D-DIMER ([facility] ONLY)   ____________________________________________  EKG  ED ECG REPORT I, Aarvi Stotts, the attending physician, personally viewed and interpreted this ECG.  Date: 10/04/2016 EKG Time: 12:09 Rate: 95 Rhythm: normal sinus rhythm QRS Axis: normal Intervals: normal ST/T Wave abnormalities: Inverted T waves in lead 3 Conduction Disturbances: none Narrative Interpretation: Non-specific ST segment / T-wave changes, but no evidence of acute ischemia.   ____________________________________________  RADIOLOGY   Dg Chest 2 View  Result Date: 10/04/2016 CLINICAL DATA:  Chest pain and shortness of breath EXAM: CHEST  2 VIEW COMPARISON:  None. FINDINGS: Lungs are clear.  Heart size and pulmonary vascularity are normal. No adenopathy. No pneumothorax. No acute fracture. There is postoperative change in the lower cervical spine region. IMPRESSION: No edema or consolidation.  No evident pneumothorax. Electronically Signed   By: Lowella Grip III M.D.   On: 10/04/2016 12:44   US Abdomen Limited Ruq  Result Date: 10/04/2016 CLINICAL DATA:  Epigastric region pain with elevated liver enzymes EXAM: US ABDOMEN LIMITED - RIGHT UPPER QUADRANT COMPARISON:  September 07, 2006 FINDINGS: Gallbladder: Surgically absent. Common bile duct: Diameter: 4 mm. No intrahepatic or extrahepatic biliary duct dilatation. Liver: No focal lesion identified. Liver echogenicity is diffusely increased. IMPRESSION: Gallbladder absent. Diffuse increase in liver echogenicity, a finding most likely indicative of hepatic steatosis. While no focal liver lesions are evident, it must be  cautioned that the sensitivity of ultrasound for detection of focal liver lesions is diminished in this circumstance. Electronically Signed   By: Lowella Grip III M.D.   On: 10/04/2016 17:26    ____________________________________________   PROCEDURES  Critical Care performed: No   Procedure(s) performed:   Procedures   ____________________________________________   INITIAL IMPRESSION / ASSESSMENT AND PLAN / ED COURSE  Pertinent labs & imaging results that were available during my care of the patient were reviewed by me and considered in my medical decision making (see chart for details).  HEART score is 3-4 based on the subjective measure of how suspicious seems her chest pain.  She has a wells score of zero her at low risk.  Even though she has had a cholecystectomy it is possible that she could have developed him and bile duct stones given that it is been nearly 15 years since her cholecystectomy.  We will continue to evaluate her for ACS and add on hepatic function panel and lipase.  She is not in any distress although obviously she is anxious about her symptoms. She had a full dose aspirin PTA in the ED.  Clinical Course as of Oct 04 1957  Mon Oct 04, 2016  1653 Given the patient's mild AST and ALT elevation and the fact that she is having lower substernal versus epigastric pain that has been constant for several days, I think it is reasonable to evaluate with a right upper quadrant ultrasound.  It is possible that even though she is status post cholecystectomy she has developed stones since the surgery has been at least 13 years ago.  I discussed this with her and she agrees that he would be interested in an ultrasound.  I did not order and specifically mentioned my concern for common bile duct dilatation.  [CF]  5188 Reassuring ultrasound.  Ordering repeat troponin and d-dimer; although she is low risk, I do not have a better alternative diagnosis.  Discussed risks/benefits  and implications of d-dimer testing with the patient and she does want to proceed, understanding that we will proceed with a CTA chest if d-dimer elevated beyond normal limits.  [CF]  1956 D-dimer is within normal limits and second troponin is negative.  Patient is very comfortable with the plan for outpatient follow-up with cardiology.  I gave my usual and customary return precautions.     [CF]    Clinical Course User Index [CF] Hinda Kehr, MD    ____________________________________________  FINAL CLINICAL IMPRESSION(S) / ED DIAGNOSES  Final diagnoses:  Chest pain, unspecified type  Elevated LFTs     MEDICATIONS GIVEN DURING THIS VISIT:  Medications - No data to display   NEW OUTPATIENT MEDICATIONS STARTED  DURING THIS VISIT:  New Prescriptions   No medications on file    Modified Medications   No medications on file    Discontinued Medications   No medications on file     Note:  This document was prepared using Dragon voice recognition software and may include unintentional dictation errors.    Hinda Kehr, MD 10/04/16 2000

## 2016-10-04 NOTE — Progress Notes (Signed)
   Subjective:    Patient ID: Michelle Figueroa, female    DOB: 1961/04/12, 56 y.o.   MRN: [id number]  HPI 56 yo female with chest pain starting yesterday after eating breakfast with eggs with green peppers and onions.  About 2 hours afterwards was having chest pain and nausea and some left arm aching ( she thinks this possibly is due to her sleeping on a different bed) so she took a zantac. Two hours later symptoms continued so she  took another zantac. Continued with chest pain, nausea and arm aching. Slept thought the night, wakes up most times two -three times recalls so pain during those periods this is not unusual for her. " I wouldn't know if I was having pain. This morning took her omeprazole. Came to [facility] for examination at 10:30am.  Still nauseated with central sternal pain and left arm aching. History of father with MI at 25 yo and deceased at  84 due to lung cancer , smoker. Brother with MI 49yo also a smoker. Patient with history of GERD, Hypertension, hypertriglyceridemia . Rates pain 4/10.  BP 135/88 HR 100 in clinic.    Review of Systems  Constitutional: Negative for chills and fever.  Cardiovascular: Positive for chest pain.  Gastrointestinal: Positive for nausea.  Musculoskeletal: Negative for back pain.       Objective:   Physical Exam  Constitutional: She is oriented to person, place, and time. She appears well-developed and well-nourished. No distress.  HENT:  Head: Normocephalic and atraumatic.  Eyes: EOM are normal. Pupils are equal, round, and reactive to light.  Neck: Normal range of motion. Neck supple.  Cardiovascular: Normal rate, regular rhythm and normal heart sounds.  Exam reveals no gallop and no friction rub.   No murmur heard. Pulmonary/Chest: Effort normal and breath sounds normal. No respiratory distress. She has no wheezes. She has no rales.  Musculoskeletal: Normal range of motion.  Lymphadenopathy:    She has no  cervical adenopathy.  Neurological: She is alert and oriented to person, place, and time.  Skin: Skin is warm and dry. She is not diaphoretic.  Psychiatric: She has a normal mood and affect. Her behavior is normal.  Nursing note and vitals reviewed.  EKG HR 94 Borderline EKG   ASA given po 81mg  x 4 tablets lot [id number] exp 10/18       Assessment & Plan:  Will Send patient to ED for further evaluation. Patient does not want to go by EMS. She called her husband and he came and is taking her to [facility]. 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  Dr. Rosanna Randy notified.

## 2016-10-04 NOTE — Discharge Instructions (Signed)
You have been seen in the Emergency Department (ED) today for chest pain.  As we have discussed today?s test results are normal, but you may require further testing.  Please follow up with the recommended doctor as instructed above in these documents regarding today?s emergent visit and your recent symptoms to discuss further management.  Continue to take your regular medications. If you are not doing so already, please also take a daily baby aspirin (81 mg), at least until you follow up with your doctor.  As a side note, your liver function tests (AST and ALT) for very slightly elevated today.  The significance of this is unknown, but you may want to discuss it with your primary care doctor or cardiologist for repeat outpatient blood work to make sure the levels are going down or standing the same.  Return to the Emergency Department (ED) if you experience any further chest pain/pressure/tightness, difficulty breathing, or sudden sweating, or other symptoms that concern you.

## 2016-10-04 NOTE — ED Triage Notes (Signed)
Pt to ed with c/o chest pain constant that started yesterday, also c/o sob and nausea with pain. Denies dizziness, denies weakness.

## 2016-10-06 ENCOUNTER — Ambulatory Visit
Admission: RE | Admit: 2016-10-06 | Discharge: 2016-10-06 | Disposition: A | Discharge status: Home / Self Care | Payer: BLUE CROSS/BLUE SHIELD | Source: Ambulatory Visit | Attending: Primary Care | Admitting: Primary Care

## 2016-10-06 ENCOUNTER — Telehealth: Payer: Self-pay | Admitting: *Deleted

## 2016-10-06 DIAGNOSIS — Z1231 Encounter for screening mammogram for malignant neoplasm of breast: Secondary | ICD-10-CM | POA: Insufficient documentation

## 2016-10-06 DIAGNOSIS — Z1239 Encounter for other screening for malignant neoplasm of breast: Secondary | ICD-10-CM

## 2016-10-18 NOTE — Progress Notes (Signed)
Cardiology Office Note   Date:  10/19/2016   ID:  TORRIN FREIN, DOB [birth year], MRN [id number]  Referring Doctor:  Sheral Flow, NP   Cardiologist:   Wende Bushy, MD   Reason for consultation:  Chief Complaint  Patient presents with  . other    f/u ED 3/26 for chest pain. Pt states she hasn't had any CP since ED. Pt c/o sob at times, fatigue. Reviewed meds with pt verbally.      History of Present Illness: Michelle Figueroa is a 56 y.o. female who presents for Chest pain area this was reached ago or so. Initially thought that it was indigestion and so therefore took a Zantac. The discomfort continued on for several hours, probably more than 4 hours. She took another Zantac. After a while, and then eventually resolved. The following day she had no chest pain upon waking up but when at work, she developed similar chest pain, 4 out of 10 in severity, noted discomfort in the shoulders as well. Patient felt very tired as well. She works at [employer], went to the nurse, recommended to go to the ER.  In the ER she was ruled out for ACS.  No chest pain currently, shortness breath, PND, orthopnea, edema.   ROS:  Please see the history of present illness. Aside from mentioned under HPI, all other systems are reviewed and negative.     Past Medical History:  Diagnosis Date  . Diverticulosis    noted on colonoscopy  . Essential hypertension   . GERD (gastroesophageal reflux disease)   . Hemorrhoids   . Insomnia     Past Surgical History:  Procedure Laterality Date  . APPENDECTOMY  2009  . CERVICAL FUSION  2010  . CHOLECYSTECTOMY  2000     reports that she has never smoked. She has never used smokeless tobacco. She reports that she drinks about 1.8 oz of alcohol per week . She reports that she does not use drugs.   family history includes Arthritis in her mother; Diabetes in her mother; Hypertension in her father; Lung cancer in her father.   Outpatient  Medications Prior to Visit  Medication Sig Dispense Refill  . escitalopram (LEXAPRO) 20 MG tablet Take 1 tablet (20 mg total) by mouth daily. 90 tablet 0  . ESTRACE VAGINAL 0.1 MG/GM vaginal cream Place 1 Applicatorful vaginally 3 (three) times a week.   0  . eszopiclone (LUNESTA) 1 MG TABS tablet Take 1-2 tablets by mouth at bedtime as needed for sleep. 180 tablet 0  . lisinopril (PRINIVIL,ZESTRIL) 20 MG tablet Take 1 tablet (20 mg total) by mouth daily. 90 tablet 0  . omeprazole (PRILOSEC) 20 MG capsule Take 1 capsule (20 mg total) by mouth daily. 90 capsule 1  . spironolactone (ALDACTONE) 25 MG tablet Take 25 mg by mouth daily.    . hydrocortisone (ANUSOL-HC) 2.5 % rectal cream Place 1 application rectally 2 (two) times daily. (Patient not taking: Reported on 10/19/2016) 30 g 0   No facility-administered medications prior to visit.      Allergies: Erythromycin ethylsuccinate and Oxycodone-acetaminophen    PHYSICAL EXAM: VS:  BP 118/80 (BP Location: Right Arm, Patient Position: Sitting, Cuff Size: Normal)   Pulse 86   Ht 5\' 6"  (1.676 m)   Wt 197 lb (89.4 kg)   BMI 31.80 kg/m  , Body mass index is 31.8 kg/m. Wt Readings from Last 3 Encounters:  10/19/16 197 lb (89.4 kg)  10/04/16 197  lb (89.4 kg)  10/04/16 197 lb (89.4 kg)    GENERAL:  well developed, well nourished, obese, not in acute distress HEENT: normocephalic, pink conjunctivae, anicteric sclerae, no xanthelasma, normal dentition, oropharynx clear NECK:  no neck vein engorgement, JVP normal, no hepatojugular reflux, carotid upstroke brisk and symmetric, no bruit, no thyromegaly, no lymphadenopathy LUNGS:  good respiratory effort, clear to auscultation bilaterally CV:  PMI not displaced, no thrills, no lifts, S1 and S2 within normal limits, no palpable S3 or S4, no murmurs, no rubs, no gallops ABD:  Soft, nontender, nondistended, normoactive bowel sounds, no abdominal aortic bruit, no hepatomegaly, no splenomegaly MS:  nontender back, no kyphosis, no scoliosis, no joint deformities EXT:  2+ DP/PT pulses, no edema, no varicosities, no cyanosis, no clubbing SKIN: warm, nondiaphoretic, normal turgor, no ulcers NEUROPSYCH: alert, oriented to person, place, and time, sensory/motor grossly intact, normal mood, appropriate affect  Recent Labs: 10/04/2016: ALT 80; BUN 16; Creatinine, Ser 0.87; Hemoglobin 15.1; Platelets 232; Potassium 4.7; Sodium 136   Lipid Panel    Component Value Date/Time   CHOL 178 02/05/2016 0000   TRIG 312 (H) 02/05/2016 0000   HDL 42 02/05/2016 0000   CHOLHDL 4.2 02/05/2016 0000   LDLCALC 74 02/05/2016 0000     Other studies Reviewed:  EKG:  The ekg from 10/19/2016 was personally reviewed by me and it revealed sinus rhythm, 86 BPM  Additional studies/ records that were reviewed personally reviewed by me today include: None available   ASSESSMENT AND PLAN: Chest pain Risk factors for CAD include hypertension and significant family history of premature CAD, hypertriglyceridemia Recommend echocardiogram and exercise nuclear stress test because of her risk factors.  Hypertension BP is well controlled. Continue monitoring BP. Continue current medical therapy and lifestyle changes.  Hypertriglyceridemia lifestyle/dietarychanges/weight loss recommended. Pt rec to ffup with PCP/GI re elevated lfts.  Current medicines are reviewed at length with the patient today.  The patient does not have concerns regarding medicines.  Labs/ tests ordered today include:  Orders Placed This Encounter  Procedures  . NM Myocar Multi W/Spect W/Wall Motion / EF  . EKG 12-Lead  . ECHOCARDIOGRAM COMPLETE    I had a lengthy and detailed discussion with the patient regarding diagnoses, prognosis, diagnostic options.  I counseled the patient on importance of lifestyle modification including heart healthy diet, regular physical activity Once cardiac workup is completed.   Disposition:   FU with  Cardiology after tests   Thank you for this consultation. We will forwarding this consultation to referring physician.   Signed, Wende Bushy, MD  10/19/2016 5:45 PM    [facility]  This note was generated in part with voice recognition software and I apologize for any typographical errors that were not detected and corrected.

## 2016-10-19 ENCOUNTER — Encounter: Payer: Self-pay | Admitting: Cardiology

## 2016-10-19 ENCOUNTER — Ambulatory Visit (INDEPENDENT_AMBULATORY_CARE_PROVIDER_SITE_OTHER): Payer: BLUE CROSS/BLUE SHIELD | Admitting: Cardiology

## 2016-10-19 VITALS — BP 118/80 | HR 86 | Ht 66.0 in | Wt 197.0 lb

## 2016-10-19 DIAGNOSIS — R079 Chest pain, unspecified: Secondary | ICD-10-CM | POA: Diagnosis not present

## 2016-10-19 DIAGNOSIS — I10 Essential (primary) hypertension: Secondary | ICD-10-CM | POA: Diagnosis not present

## 2016-10-19 DIAGNOSIS — R0602 Shortness of breath: Secondary | ICD-10-CM | POA: Diagnosis not present

## 2016-10-19 NOTE — Patient Instructions (Addendum)
Testing/Procedures: Your physician has requested that you have an echocardiogram. Echocardiography is a painless test that uses sound waves to create images of your heart. It provides your doctor with information about the size and shape of your heart and how well your heart's chambers and valves are working. This procedure takes approximately one hour. There are no restrictions for this procedure.  Michelle Figueroa  Your caregiver has ordered a Stress Test with nuclear imaging. The purpose of this test is to evaluate the blood supply to your heart muscle. This procedure is referred to as a "Non-Invasive Stress Test." This is because other than having an IV started in your vein, nothing is inserted or "invades" your body. Cardiac stress tests are done to find areas of poor blood flow to the heart by determining the extent of coronary artery disease (CAD). Some patients exercise on a treadmill, which naturally increases the blood flow to your heart, while others who are  unable to walk on a treadmill due to physical limitations have a pharmacologic/chemical stress agent called Lexiscan . This medicine will mimic walking on a treadmill by temporarily increasing your coronary blood flow.   Please note: these test may take anywhere between 2-4 hours to complete  PLEASE REPORT TO [facility] AT THE FIRST DESK WILL DIRECT YOU WHERE TO GO  Date of Procedure:_Thursday October 28, 2016 at 08:00AM_  Arrival Time for Procedure:_Arrive at 07:45AM to register_   PLEASE NOTIFY THE OFFICE AT LEAST 24 HOURS IN ADVANCE IF YOU ARE UNABLE TO [place].  [phone] AND  PLEASE NOTIFY NUCLEAR MEDICINE AT [facility] AT LEAST 24 HOURS IN ADVANCE IF YOU ARE UNABLE TO KEEP YOUR APPOINTMENT. [phone]  How to prepare for your Myoview test:  1. Do not eat or drink after midnight 2. No caffeine for 24 hours prior to test 3. No smoking 24 hours prior to test. 4. Your medication may  be taken with water.  If your doctor stopped a medication because of this test, do not take that medication. 5. Ladies, please do not wear dresses.  Skirts or pants are appropriate. Please wear a short sleeve shirt. 6. No perfume, cologne or lotion. 7. Wear comfortable walking shoes. No heels!       Follow-Up: Your physician recommends that you schedule a follow-up appointment as needed. We will call you with results and if needed schedule follow up at that time.   It was a pleasure seeing you today here in the office. Please do not hesitate to give [place] a call back if you have any further questions. Palmer, BSN    Echocardiogram An echocardiogram, or echocardiography, uses sound waves (ultrasound) to produce an image of your heart. The echocardiogram is simple, painless, obtained within a short period of time, and offers valuable information to your health care provider. The images from an echocardiogram can provide information such as:  Evidence of coronary artery disease (CAD).  Heart size.  Heart muscle function.  Heart valve function.  Aneurysm detection.  Evidence of a past heart attack.  Fluid buildup around the heart.  Heart muscle thickening.  Assess heart valve function. Tell a health care provider about:  Any allergies you have.  All medicines you are taking, including vitamins, herbs, eye drops, creams, and over-the-counter medicines.  Any problems you or family members have had with anesthetic medicines.  Any blood disorders you have.  Any surgeries you have had.  Any medical  conditions you have.  Whether you are pregnant or may be pregnant. What happens before the procedure? No special preparation is needed. Eat and drink normally. What happens during the procedure?  In order to produce an image of your heart, gel will be applied to your chest and a wand-like tool (transducer) will be moved over your chest. The gel will help  transmit the sound waves from the transducer. The sound waves will harmlessly bounce off your heart to allow the heart images to be captured in real-time motion. These images will then be recorded.  You may need an IV to receive a medicine that improves the quality of the pictures. What happens after the procedure? You may return to your normal schedule including diet, activities, and medicines, unless your health care provider tells you otherwise. This information is not intended to replace advice given to you by your health care provider. Make sure you discuss any questions you have with your health care provider. Document Released: 06/25/2000 Document Revised: 02/14/2016 Document Reviewed: 03/05/2013 Elsevier Interactive Patient Education  2017 [place]. Cardiac Nuclear Scan A cardiac nuclear scan is a test that measures blood flow to the heart when a person is resting and when he or she is exercising. The test looks for problems such as:  Not enough blood reaching a portion of the heart.  The heart muscle not working normally. You may need this test if:  You have heart disease.  You have had abnormal lab results.  You have had heart surgery or angioplasty.  You have chest pain.  You have shortness of breath. In this test, a radioactive dye (tracer) is injected into your bloodstream. After the tracer has traveled to your heart, an imaging device is used to measure how much of the tracer is absorbed by or distributed to various areas of your heart. This procedure is usually done at a hospital and takes 2-4 hours. Tell a health care provider about:  Any allergies you have.  All medicines you are taking, including vitamins, herbs, eye drops, creams, and over-the-counter medicines.  Any problems you or family members have had with the use of anesthetic medicines.  Any blood disorders you have.  Any surgeries you have had.  Any medical conditions you have.  Whether you are  pregnant or may be pregnant. What are the risks? Generally, this is a safe procedure. However, problems may occur, including:  Serious chest pain and heart attack. This is only a risk if the stress portion of the test is done.  Rapid heartbeat.  Sensation of warmth in your chest. This usually passes quickly. What happens before the procedure?  Ask your health care provider about changing or stopping your regular medicines. This is especially important if you are taking diabetes medicines or blood thinners.  Remove your jewelry on the day of the procedure. What happens during the procedure?  An IV tube will be inserted into one of your veins.  Your health care provider will inject a small amount of radioactive tracer through the tube.  You will wait for 20-40 minutes while the tracer travels through your bloodstream.  Your heart activity will be monitored with an electrocardiogram (ECG).  You will lie down on an exam table.  Images of your heart will be taken for about 15-20 minutes.  You may be asked to exercise on a treadmill or stationary bike. While you exercise, your heart's activity will be monitored with an ECG, and your blood pressure will be  checked. If you are unable to exercise, you may be given a medicine to increase blood flow to parts of your heart.  When blood flow to your heart has peaked, a tracer will again be injected through the IV tube.  After 20-40 minutes, you will get back on the exam table and have more images taken of your heart.  When the procedure is over, your IV tube will be removed. The procedure may vary among health care providers and hospitals. Depending on the type of tracer used, scans may need to be repeated 3-4 hours later. What happens after the procedure?  Unless your health care provider tells you otherwise, you may return to your normal schedule, including diet, activities, and medicines.  Unless your health care provider tells you  otherwise, you may increase your fluid intake. This will help flush the contrast dye from your body. Drink enough fluid to keep your urine clear or pale yellow.  It is up to you to get your test results. Ask your health care provider, or the department that is doing the test, when your results will be ready. Summary  A cardiac nuclear scan measures the blood flow to the heart when a person is resting and when he or she is exercising.  You may need this test if you are at risk for heart disease.  Tell your health care provider if you are pregnant.  Unless your health care provider tells you otherwise, increase your fluid intake. This will help flush the contrast dye from your body. Drink enough fluid to keep your urine clear or pale yellow. This information is not intended to replace advice given to you by your health care provider. Make sure you discuss any questions you have with your health care provider. Document Released: 07/23/2004 Document Revised: 06/30/2016 Document Reviewed: 06/06/2013 Elsevier Interactive Patient Education  2017 [employer].

## 2016-10-20 NOTE — Telephone Encounter (Signed)
Contacted pt by phone to f/u ED visit for c/o chest pain. Bubba Camp, RN

## 2016-10-27 ENCOUNTER — Telehealth: Payer: Self-pay | Admitting: Cardiology

## 2016-10-27 DIAGNOSIS — Z01818 Encounter for other preprocedural examination: Secondary | ICD-10-CM

## 2016-10-27 NOTE — Telephone Encounter (Signed)
Urine pregnancy order entered and Abby in Nuclear Med notified.

## 2016-10-27 NOTE — Telephone Encounter (Signed)
Nurse with Nuclear Medicine states pt needs pregnancy order put in for her  stress test tomorrow.

## 2016-10-28 ENCOUNTER — Other Ambulatory Visit
Admission: RE | Admit: 2016-10-28 | Discharge: 2016-10-28 | Disposition: A | Discharge status: Home / Self Care | Payer: BLUE CROSS/BLUE SHIELD | Source: Ambulatory Visit | Attending: Cardiology | Admitting: Cardiology

## 2016-10-28 ENCOUNTER — Ambulatory Visit
Admission: RE | Admit: 2016-10-28 | Discharge: 2016-10-28 | Disposition: A | Discharge status: Home / Self Care | Payer: BLUE CROSS/BLUE SHIELD | Source: Ambulatory Visit | Attending: Cardiology | Admitting: Cardiology

## 2016-10-28 DIAGNOSIS — R079 Chest pain, unspecified: Secondary | ICD-10-CM

## 2016-10-28 DIAGNOSIS — Z01818 Encounter for other preprocedural examination: Secondary | ICD-10-CM

## 2016-10-28 DIAGNOSIS — R0602 Shortness of breath: Secondary | ICD-10-CM | POA: Diagnosis not present

## 2016-10-28 LAB — NM MYOCAR MULTI W/SPECT W/WALL MOTION / EF
CHL CUP MPHR: 166 {beats}/min
CHL CUP NUCLEAR SDS: 0
CHL CUP NUCLEAR SRS: 0
CHL CUP RESTING HR STRESS: 76 {beats}/min
CSEPED: 6 min
CSEPEW: 7 METS
Exercise duration (sec): 0 s
LV dias vol: 50 mL (ref 46–106)
LV sys vol: 16 mL
NUC STRESS TID: 0.71
Peak HR: 142 {beats}/min
Percent HR: 85 %
SSS: 0

## 2016-10-28 LAB — PREGNANCY, URINE: PREG TEST UR: NEGATIVE

## 2016-10-28 MED ORDER — TECHNETIUM TC 99M TETROFOSMIN IV KIT
30.7100 | PACK | Freq: Once | INTRAVENOUS | Status: AC | PRN
  Administered 2016-10-28: 30.71 via INTRAVENOUS

## 2016-10-28 MED ORDER — TECHNETIUM TC 99M TETROFOSMIN IV KIT
13.0000 | PACK | Freq: Once | INTRAVENOUS | Status: AC | PRN
  Administered 2016-10-28: 12.925 via INTRAVENOUS

## 2016-11-01 ENCOUNTER — Other Ambulatory Visit: Payer: Self-pay | Admitting: Primary Care

## 2016-11-01 DIAGNOSIS — F411 Generalized anxiety disorder: Secondary | ICD-10-CM

## 2016-11-01 MED ORDER — ESCITALOPRAM OXALATE 20 MG PO TABS: 20.0000 mg | ORAL_TABLET | Freq: Every day | ORAL | 2 refills | Status: DC

## 2016-11-01 NOTE — Telephone Encounter (Signed)
Ok to refill? Electronically refill request for escitalopram (LEXAPRO) 20 MG tablet. Last prescribed on 07/16/2016. Last seen on 10/19/2016

## 2016-11-08 ENCOUNTER — Other Ambulatory Visit: Payer: Self-pay

## 2016-11-08 ENCOUNTER — Ambulatory Visit (INDEPENDENT_AMBULATORY_CARE_PROVIDER_SITE_OTHER): Payer: BLUE CROSS/BLUE SHIELD

## 2016-11-08 DIAGNOSIS — R079 Chest pain, unspecified: Secondary | ICD-10-CM | POA: Diagnosis not present

## 2016-11-08 DIAGNOSIS — R0602 Shortness of breath: Secondary | ICD-10-CM | POA: Diagnosis not present

## 2016-11-15 ENCOUNTER — Other Ambulatory Visit: Payer: Self-pay | Admitting: Primary Care

## 2016-11-15 DIAGNOSIS — K219 Gastro-esophageal reflux disease without esophagitis: Secondary | ICD-10-CM

## 2017-01-03 ENCOUNTER — Other Ambulatory Visit: Payer: Self-pay | Admitting: Primary Care

## 2017-01-03 DIAGNOSIS — G4700 Insomnia, unspecified: Secondary | ICD-10-CM

## 2017-01-04 NOTE — Telephone Encounter (Signed)
Ok to refill? Electronically refill request for eszopiclone (LUNESTA) 1 MG TABS tablet.  Last prescribed on 09/29/2016. Last seen on 09/10/2016

## 2017-01-05 ENCOUNTER — Other Ambulatory Visit: Payer: Self-pay | Admitting: Primary Care

## 2017-01-05 ENCOUNTER — Ambulatory Visit: Payer: BLUE CROSS/BLUE SHIELD | Admitting: Medical

## 2017-01-05 ENCOUNTER — Encounter: Payer: Self-pay | Admitting: Medical

## 2017-01-05 VITALS — BP 138/90 | HR 106 | Temp 97.6°F | Resp 18 | Ht 66.0 in | Wt 196.0 lb

## 2017-01-05 DIAGNOSIS — G4700 Insomnia, unspecified: Secondary | ICD-10-CM

## 2017-01-05 DIAGNOSIS — M545 Low back pain, unspecified: Secondary | ICD-10-CM

## 2017-01-05 LAB — CBC WITH DIFFERENTIAL/PLATELET
BASOS ABS: 0 10*3/uL (ref 0.0–0.2)
Basos: 1 %
EOS (ABSOLUTE): 0.1 10*3/uL (ref 0.0–0.4)
Eos: 2 %
HEMATOCRIT: 42.6 % (ref 34.0–46.6)
Hemoglobin: 14.6 g/dL (ref 11.1–15.9)
Immature Grans (Abs): 0 10*3/uL (ref 0.0–0.1)
Immature Granulocytes: 1 %
LYMPHS ABS: 2 10*3/uL (ref 0.7–3.1)
Lymphs: 35 %
MCH: 30.6 pg (ref 26.6–33.0)
MCHC: 34.3 g/dL (ref 31.5–35.7)
MCV: 89 fL (ref 79–97)
MONOS ABS: 0.6 10*3/uL (ref 0.1–0.9)
Monocytes: 10 %
Neutrophils Absolute: 3 10*3/uL (ref 1.4–7.0)
Neutrophils: 51 %
Platelets: 202 10*3/uL (ref 150–379)
RBC: 4.77 x10E6/uL (ref 3.77–5.28)
RDW: 13.5 % (ref 12.3–15.4)
WBC: 5.8 10*3/uL (ref 3.4–10.8)

## 2017-01-05 LAB — COMPREHENSIVE METABOLIC PANEL
ALK PHOS: 110 IU/L (ref 39–117)
ALT: 103 IU/L — ABNORMAL HIGH (ref 0–32)
AST: 131 IU/L — AB (ref 0–40)
Albumin/Globulin Ratio: 1.7 (ref 1.2–2.2)
Albumin: 4.3 g/dL (ref 3.5–5.5)
BUN/Creatinine Ratio: 26 — ABNORMAL HIGH (ref 9–23)
BUN: 19 mg/dL (ref 6–24)
Bilirubin Total: 0.6 mg/dL (ref 0.0–1.2)
CO2: 22 mmol/L (ref 20–29)
CREATININE: 0.74 mg/dL (ref 0.57–1.00)
Calcium: 9.6 mg/dL (ref 8.7–10.2)
Chloride: 101 mmol/L (ref 96–106)
GFR calc Af Amer: 106 mL/min/{1.73_m2} (ref >59)
GFR calc non Af Amer: 92 mL/min/{1.73_m2} (ref >59)
GLOBULIN, TOTAL: 2.5 g/dL (ref 1.5–4.5)
GLUCOSE: 138 mg/dL — AB (ref 65–99)
Potassium: 4.5 mmol/L (ref 3.5–5.2)
SODIUM: 134 mmol/L (ref 134–144)
Total Protein: 6.8 g/dL (ref 6.0–8.5)

## 2017-01-05 LAB — POCT URINALYSIS DIPSTICK
Bilirubin, UA: NEGATIVE
Blood, UA: NEGATIVE
Glucose, UA: NEGATIVE
Ketones, UA: NEGATIVE
Leukocytes, UA: NEGATIVE
Nitrite, UA: NEGATIVE
PROTEIN UA: NEGATIVE
SPEC GRAV UA: 1.015 (ref 1.010–1.025)
UROBILINOGEN UA: 0.2 U/dL
pH, UA: 6 (ref 5.0–8.0)

## 2017-01-05 MED ORDER — CIPROFLOXACIN HCL 500 MG PO TABS: 500.0000 mg | ORAL_TABLET | Freq: Two times a day (BID) | ORAL | 0 refills | Status: DC

## 2017-01-05 NOTE — Telephone Encounter (Signed)
Called in medication to the pharmacy as instructed. 

## 2017-01-05 NOTE — Progress Notes (Signed)
Subjective:    Patient ID: Michelle Figueroa, female    DOB: [birth year], 56 y.o.   MRN: [id number]  HPI Started yesterday morning with  Back pain on the left side mid abdomen, no fever, mild nausea last night, no vomiting. No chills. On urination no dysuria. Patient with cholecysectomy and  Appendectomy  History rectal fissure and hemerrhoids so she does have blood in her stool. See " Dr. Dagoberto Reef has had  To remove  2 hemorrhoids before" Yesterday took tylenol in the afternoon and ibuprofen at night , layed on heating pad. Denies ovarian cysts or other female problems. Complains of back pain left side but then states it is all the way across. Also complaining of lower abdominal pain all the way across.  Review of Systems  Constitutional: Negative for chills and fever.  HENT: Negative for congestion, ear pain and sore throat.   Eyes: Negative for discharge and itching.  Respiratory: Negative for cough and shortness of breath.   Cardiovascular: Negative for chest pain.  Gastrointestinal: Positive for abdominal pain and blood in stool.  Genitourinary: Positive for flank pain and pelvic pain. Negative for dysuria and hematuria.  Musculoskeletal: Positive for back pain.  Skin: Negative for rash.  Allergic/Immunologic: Negative for environmental allergies and food allergies.  Neurological: Negative for dizziness and syncope.   Blood in her stool is her normal base line due to her fissure per patient Offered KUB x-ray but patient does not want an one "just" yet   Urine dip negative color light yellow. Objective:   Physical Exam  Constitutional: She is oriented to person, place, and time. She appears well-developed and well-nourished.  HENT:  Head: Normocephalic and atraumatic.  Eyes: Pupils are equal, round, and reactive to light.  Cardiovascular: Normal rate and normal heart sounds.  Exam reveals no gallop and no friction rub.   No murmur heard. Pulmonary/Chest: Effort normal and  breath sounds normal.  Abdominal: Soft. Bowel sounds are normal. She exhibits no mass. There is tenderness. There is no rebound and no guarding.  Musculoskeletal: Normal range of motion.  Neurological: She is alert and oriented to person, place, and time.  Skin: Skin is warm and dry.  Psychiatric: She has a normal mood and affect. Her behavior is normal. Judgment and thought content normal.  Nursing note and vitals reviewed.  Patient looks uncomfortable not doing the kidney rock. CVA tenderness on the left. But then states it hurts all the way across her back.   UA  Dip negtive. Assessment & Plan:  Cipro 500 mg one by mouth twice daily for seven days.  #14 no refills CBCw/diff and Met C STAT Urine Culture and sensitivity not sent wasted before ordered. Offered patient x-ray but she decline for now. Reviewed with patient that she may be having a kidney stone or  Diverticulitis.  Reviewed with patient that if she feels worse going to the hospital for a CT of Abdomen is a possible alternative. I will call patient with lab results when I get them.  CAlled patient  2:50 pm with results of  CBC and diff and Met C all wnl but elevated AST and ALT which she says is her norm for the last  30 years. Reviewed with patient x-ray can be done , she had just taken 2 tylenol which did help some .  She says she will see how she is doing and how she feels tomorrow. Confirmed with patient to follow up with me tomorrow if  not imrproving.

## 2017-01-10 DIAGNOSIS — Z86010 Personal history of colonic polyps: Secondary | ICD-10-CM | POA: Diagnosis not present

## 2017-01-10 DIAGNOSIS — L718 Other rosacea: Secondary | ICD-10-CM | POA: Diagnosis not present

## 2017-01-10 DIAGNOSIS — R7989 Other specified abnormal findings of blood chemistry: Secondary | ICD-10-CM | POA: Diagnosis not present

## 2017-01-10 DIAGNOSIS — Z79899 Other long term (current) drug therapy: Secondary | ICD-10-CM | POA: Diagnosis not present

## 2017-01-10 DIAGNOSIS — L700 Acne vulgaris: Secondary | ICD-10-CM | POA: Diagnosis not present

## 2017-01-10 DIAGNOSIS — L729 Follicular cyst of the skin and subcutaneous tissue, unspecified: Secondary | ICD-10-CM | POA: Diagnosis not present

## 2017-01-11 ENCOUNTER — Other Ambulatory Visit: Payer: Self-pay | Admitting: Gastroenterology

## 2017-01-11 DIAGNOSIS — R7989 Other specified abnormal findings of blood chemistry: Secondary | ICD-10-CM

## 2017-01-11 DIAGNOSIS — R945 Abnormal results of liver function studies: Principal | ICD-10-CM

## 2017-01-12 ENCOUNTER — Encounter: Payer: Self-pay | Admitting: Medical

## 2017-01-18 DIAGNOSIS — R7989 Other specified abnormal findings of blood chemistry: Secondary | ICD-10-CM | POA: Diagnosis not present

## 2017-01-19 ENCOUNTER — Other Ambulatory Visit: Payer: Self-pay | Admitting: Gastroenterology

## 2017-01-19 ENCOUNTER — Ambulatory Visit
Admission: RE | Admit: 2017-01-19 | Discharge: 2017-01-19 | Disposition: A | Discharge status: Home / Self Care | Payer: BLUE CROSS/BLUE SHIELD | Source: Ambulatory Visit | Attending: Gastroenterology | Admitting: Gastroenterology

## 2017-01-19 DIAGNOSIS — K760 Fatty (change of) liver, not elsewhere classified: Secondary | ICD-10-CM | POA: Insufficient documentation

## 2017-01-19 DIAGNOSIS — R945 Abnormal results of liver function studies: Principal | ICD-10-CM

## 2017-01-19 DIAGNOSIS — R7989 Other specified abnormal findings of blood chemistry: Secondary | ICD-10-CM

## 2017-01-19 DIAGNOSIS — Z9049 Acquired absence of other specified parts of digestive tract: Secondary | ICD-10-CM | POA: Diagnosis not present

## 2017-01-24 ENCOUNTER — Other Ambulatory Visit: Payer: Self-pay | Admitting: Primary Care

## 2017-01-24 DIAGNOSIS — I10 Essential (primary) hypertension: Secondary | ICD-10-CM

## 2017-01-24 MED ORDER — LISINOPRIL 20 MG PO TABS: 20.0000 mg | ORAL_TABLET | Freq: Every day | ORAL | 1 refills | Status: DC

## 2017-02-22 ENCOUNTER — Ambulatory Visit: Payer: BLUE CROSS/BLUE SHIELD

## 2017-03-03 ENCOUNTER — Encounter: Payer: Self-pay | Admitting: Primary Care

## 2017-03-03 DIAGNOSIS — K219 Gastro-esophageal reflux disease without esophagitis: Secondary | ICD-10-CM

## 2017-03-04 MED ORDER — OMEPRAZOLE 20 MG PO CPDR: DELAYED_RELEASE_CAPSULE | ORAL | 1 refills | Status: DC

## 2017-03-09 DIAGNOSIS — L820 Inflamed seborrheic keratosis: Secondary | ICD-10-CM | POA: Diagnosis not present

## 2017-03-09 DIAGNOSIS — L739 Follicular disorder, unspecified: Secondary | ICD-10-CM | POA: Diagnosis not present

## 2017-03-09 DIAGNOSIS — R21 Rash and other nonspecific skin eruption: Secondary | ICD-10-CM | POA: Diagnosis not present

## 2017-03-09 DIAGNOSIS — L720 Epidermal cyst: Secondary | ICD-10-CM | POA: Diagnosis not present

## 2017-03-29 DIAGNOSIS — L821 Other seborrheic keratosis: Secondary | ICD-10-CM | POA: Diagnosis not present

## 2017-03-29 DIAGNOSIS — D485 Neoplasm of uncertain behavior of skin: Secondary | ICD-10-CM | POA: Diagnosis not present

## 2017-03-29 DIAGNOSIS — R21 Rash and other nonspecific skin eruption: Secondary | ICD-10-CM | POA: Diagnosis not present

## 2017-03-29 DIAGNOSIS — L820 Inflamed seborrheic keratosis: Secondary | ICD-10-CM | POA: Diagnosis not present

## 2017-03-29 DIAGNOSIS — L578 Other skin changes due to chronic exposure to nonionizing radiation: Secondary | ICD-10-CM | POA: Diagnosis not present

## 2017-03-29 DIAGNOSIS — L920 Granuloma annulare: Secondary | ICD-10-CM | POA: Diagnosis not present

## 2017-04-05 DIAGNOSIS — L920 Granuloma annulare: Secondary | ICD-10-CM | POA: Diagnosis not present

## 2017-04-11 ENCOUNTER — Other Ambulatory Visit: Payer: Self-pay | Admitting: Primary Care

## 2017-04-11 DIAGNOSIS — G4700 Insomnia, unspecified: Secondary | ICD-10-CM

## 2017-04-11 NOTE — Telephone Encounter (Signed)
Okay to refill, #180, no refills.

## 2017-04-11 NOTE — Telephone Encounter (Signed)
Ok to refill? Electronically refill request for eszopiclone (LUNESTA) 1 MG TABS tablet  Last prescribed on 01/04/2017. Last seen on 09/20/2016

## 2017-04-12 NOTE — Telephone Encounter (Signed)
Called in medication to the pharmacy as instructed. 

## 2017-05-02 ENCOUNTER — Ambulatory Visit (INDEPENDENT_AMBULATORY_CARE_PROVIDER_SITE_OTHER): Payer: BLUE CROSS/BLUE SHIELD | Admitting: Primary Care

## 2017-05-02 ENCOUNTER — Encounter: Payer: Self-pay | Admitting: Primary Care

## 2017-05-02 VITALS — BP 132/76 | HR 91 | Temp 97.9°F | Ht 66.0 in | Wt 197.4 lb

## 2017-05-02 DIAGNOSIS — F411 Generalized anxiety disorder: Secondary | ICD-10-CM | POA: Diagnosis not present

## 2017-05-02 MED ORDER — CITALOPRAM HYDROBROMIDE 40 MG PO TABS: 40.0000 mg | ORAL_TABLET | Freq: Every day | ORAL | 0 refills | Status: DC

## 2017-05-02 MED ORDER — ALPRAZOLAM 0.25 MG PO TABS: ORAL_TABLET | ORAL | 0 refills | Status: DC

## 2017-05-02 NOTE — Patient Instructions (Signed)
Stop Lexapro and start Celexa. Try taking Celexa at bedtime to prevent daytime drowsiness.  Use the alprazolam sparingly for anxiety attacks.  Please update me in 4 weeks.  It was a pleasure to see you today!

## 2017-05-02 NOTE — Assessment & Plan Note (Signed)
Feels that Lexapro is no longer effective. Also noted some depression with anxiety: GAD 7 score of 10 and PHQ 9 score of 15 today.  Will switch back to Celexa and increase dose to 40 mg. She will update with regarding symptoms in 4 weeks. Consider adding low dose Wellbutrin if no improvement.  Short term supply of alprazolam provided to use while switching from Lexapro to Celexa. Discussed that this was to be used sparingly and that we will not prescribe this for daily use. She verbalized understanding.

## 2017-05-02 NOTE — Progress Notes (Signed)
Subjective:    Patient ID: Michelle Figueroa, female    DOB: October 15, 1960, 56 y.o.   MRN: [id number]  HPI  Michelle Figueroa is a 56 year old female with a history of anxiety disorder who presents today to discuss medication  History of anxiety for years. Over the past 1-2 weeks she's experienced a lot of personal stress and is having a tough time coping. She's making mistakes at work, can't focus, experiences tearfulness, feeling more anxious. Symptoms began about one week ago. Previously managed on Celexa and feels like she did better on this. She was switched to Lexapro as Celexa caused daytime drowsiness. Since she's been on Lexapro she's felt that it wasn't as effective as Celexa, especially during bouts of increased stress. GAD 7 score of 10 and PHQ 9 score of 15 today.   Review of Systems  Constitutional: Negative for fatigue.  Respiratory: Negative for shortness of breath.   Cardiovascular: Negative for chest pain.  Psychiatric/Behavioral: The patient is nervous/anxious.        See HPI       Past Medical History:  Diagnosis Date  . Diverticulosis    noted on colonoscopy  . Essential hypertension   . GERD (gastroesophageal reflux disease)   . Hemorrhoids   . Insomnia      Social History   Social History  . Marital status: Married    Spouse name: N/A  . Number of children: N/A  . Years of education: N/A   Occupational History  . Not on file.   Social History Main Topics  . Smoking status: Never Smoker  . Smokeless tobacco: Never Used  . Alcohol use 1.8 oz/week    3 Standard drinks or equivalent per week  . Drug use: No  . Sexual activity: Not on file   Other Topics Concern  . Not on file   Social History Narrative   Married.   2 children.   Works in [employer] at [employer].    Enjoys traveling, spending time with family.     Past Surgical History:  Procedure Laterality Date  . APPENDECTOMY  2009  . CERVICAL FUSION  2010  . CHOLECYSTECTOMY  2000     Family History  Problem Relation Age of Onset  . Arthritis Mother   . Diabetes Mother   . Lung cancer Father   . Hypertension Father   . Breast cancer Neg Hx     Allergies  Allergen Reactions  . Erythromycin Ethylsuccinate Rash  . Oxycodone-Acetaminophen Nausea Only    Current Outpatient Prescriptions on File Prior to Visit  Medication Sig Dispense Refill  . aspirin EC 81 MG tablet Take 81 mg by mouth daily.    Marland Kitchen ESTRACE VAGINAL 0.1 MG/GM vaginal cream Place 1 Applicatorful vaginally 3 (three) times a week.   0  . eszopiclone (LUNESTA) 1 MG TABS tablet TAKE 1 TO 2 TABLETS BY MOUTH AT BEDTIME AS NEEDED 180 tablet 0  . lisinopril (PRINIVIL,ZESTRIL) 20 MG tablet Take 1 tablet (20 mg total) by mouth daily. 90 tablet 1  . omeprazole (PRILOSEC) 20 MG capsule TAKE 1 CAPSULE BY MOUTH EVERY DAY 90 capsule 1  . spironolactone (ALDACTONE) 25 MG tablet Take 25 mg by mouth daily.     No current facility-administered medications on file prior to visit.     BP 132/76   Pulse 91   Temp 97.9 F (36.6 C) (Oral)   Ht 5\' 6"  (1.676 m)   Wt 197 lb 6.4  oz (89.5 kg)   SpO2 96%   BMI 31.86 kg/m    Objective:   Physical Exam  Constitutional: She appears well-nourished.  Cardiovascular: Normal rate and regular rhythm.   Pulmonary/Chest: Effort normal and breath sounds normal.  Skin: Skin is warm and dry.  Psychiatric:  Tearful during exam today          Assessment & Plan:

## 2017-05-19 ENCOUNTER — Other Ambulatory Visit: Payer: Self-pay | Admitting: Primary Care

## 2017-05-19 DIAGNOSIS — I10 Essential (primary) hypertension: Secondary | ICD-10-CM

## 2017-07-13 ENCOUNTER — Other Ambulatory Visit: Payer: Self-pay | Admitting: Primary Care

## 2017-07-13 DIAGNOSIS — G4700 Insomnia, unspecified: Secondary | ICD-10-CM

## 2017-07-13 MED ORDER — ESZOPICLONE 1 MG PO TABS: 1.0000 mg | ORAL_TABLET | Freq: Every evening | ORAL | 0 refills | Status: DC | PRN

## 2017-07-13 NOTE — Telephone Encounter (Signed)
Okay to phone in as listed on chart. #180, no refills.

## 2017-07-13 NOTE — Telephone Encounter (Signed)
Ok to refill? Electronically refill request for eszopiclone (LUNESTA) 1 MG TABS tablet  Last prescribed on 04/11/2017. Last seen on 05/02/2017

## 2017-07-14 DIAGNOSIS — Z1283 Encounter for screening for malignant neoplasm of skin: Secondary | ICD-10-CM | POA: Diagnosis not present

## 2017-07-14 DIAGNOSIS — L700 Acne vulgaris: Secondary | ICD-10-CM | POA: Diagnosis not present

## 2017-07-14 DIAGNOSIS — L720 Epidermal cyst: Secondary | ICD-10-CM | POA: Diagnosis not present

## 2017-07-14 DIAGNOSIS — L820 Inflamed seborrheic keratosis: Secondary | ICD-10-CM | POA: Diagnosis not present

## 2017-07-14 DIAGNOSIS — D229 Melanocytic nevi, unspecified: Secondary | ICD-10-CM | POA: Diagnosis not present

## 2017-07-14 NOTE — Telephone Encounter (Signed)
Called in medication to the pharmacy as instructed. 

## 2017-07-29 ENCOUNTER — Ambulatory Visit: Payer: Self-pay | Admitting: Primary Care

## 2017-08-03 ENCOUNTER — Ambulatory Visit: Payer: BLUE CROSS/BLUE SHIELD | Admitting: Primary Care

## 2017-08-24 ENCOUNTER — Ambulatory Visit: Payer: BLUE CROSS/BLUE SHIELD | Admitting: Obstetrics & Gynecology

## 2017-08-24 ENCOUNTER — Other Ambulatory Visit: Payer: Self-pay

## 2017-08-24 ENCOUNTER — Encounter: Payer: Self-pay | Admitting: Obstetrics & Gynecology

## 2017-08-24 VITALS — BP 118/68 | HR 84 | Resp 16 | Ht 65.0 in | Wt 195.4 lb

## 2017-08-24 DIAGNOSIS — Z01419 Encounter for gynecological examination (general) (routine) without abnormal findings: Secondary | ICD-10-CM

## 2017-08-24 DIAGNOSIS — N9089 Other specified noninflammatory disorders of vulva and perineum: Secondary | ICD-10-CM | POA: Diagnosis not present

## 2017-08-24 DIAGNOSIS — F411 Generalized anxiety disorder: Secondary | ICD-10-CM

## 2017-08-24 DIAGNOSIS — N3943 Post-void dribbling: Secondary | ICD-10-CM | POA: Diagnosis not present

## 2017-08-24 MED ORDER — CITALOPRAM HYDROBROMIDE 20 MG PO TABS: 20.0000 mg | ORAL_TABLET | Freq: Every day | ORAL | 5 refills | Status: DC

## 2017-08-24 NOTE — Progress Notes (Addendum)
57 y.o. G3P2 Married Caucasian F here for annual exam/new patient exam.  H/o endometrial ablation "years" ago and has not had any bleeding since.  Now menopausal.  Doesn't have hot flashes but just a couple of night sweats.  Sleep is good.  Took Lunesta for "years" and has stopped this.  Feels like it wasn't really helping her that much.  Wakes up at 2 about every night but able to go back to sleep.     H/o elevated liver enzymes about 20 years.  Had ultrasound, full hepatitis testing, and liver biopsy.  Has fatty liver.    Has "transvaginal mesh surgery" by Dr. Davis Gourd at [facility] in 2010.  Pt does not describe this as a sling or as an anterior repair.  Did not have a hysterectomy.  Denies h/o prolapse or cystocele symptoms.  Pt now having issues with voiding.  Reports she feels she empties completely but then will stand up and have urge to void and then will actually leak on herself.  She wears pads all the time.  Denies typical stress incontinence symptoms like leakage with laugh, lifting, sneezing, or coughing.  She also does not have a lot of urgency except right after she voids.  Denies vaginal bleeding or vaginal discharge  No LMP recorded. Patient is postmenopausal.          Sexually active: Yes.    The current method of family planning is post menopausal status.    Exercising: No.  The patient does not participate in regular exercise at present. Smoker:  no  Health Maintenance: Pap:  07/26/16 negative, HR HPV negative  History of abnormal Pap:  yes MMG:  10/07/16 BIRADS 1 negative  Colonoscopy:  02/15/14 diverticulosis, repeat 5 years  BMD:   never TDaP:  02/10/16  Pneumonia vaccine(s):  no Shingrix:   No  Hep C testing: 02/05/16 negative  Screening Labs: PCP, Hb today: PCP, Urine today: not collected   reports that  has never smoked. she has never used smokeless tobacco. She reports that she drinks about 1.8 oz of alcohol per week. She reports that she does not use  drugs.  Past Medical History:  Diagnosis Date  . Diverticulosis    noted on colonoscopy  . Essential hypertension   . GERD (gastroesophageal reflux disease)   . Hemorrhoids   . Insomnia     Past Surgical History:  Procedure Laterality Date  . APPENDECTOMY  2009  . CERVICAL FUSION  2010  . CESAREAN SECTION    . CHOLECYSTECTOMY  2000  . ENDOMETRIAL ABLATION    . GYNECOLOGIC CRYOSURGERY      Current Outpatient Medications  Medication Sig Dispense Refill  . citalopram (CELEXA) 40 MG tablet Take 1 tablet (40 mg total) by mouth daily. 90 tablet 0  . ESTRACE VAGINAL 0.1 MG/GM vaginal cream Place 1 Applicatorful vaginally as needed.   0  . lisinopril (PRINIVIL,ZESTRIL) 20 MG tablet  TAKE ONE TABLET EVERY DAY 90 tablet 1  . omeprazole (PRILOSEC) 20 MG capsule TAKE 1 CAPSULE BY MOUTH EVERY DAY 90 capsule 1  . spironolactone (ALDACTONE) 25 MG tablet Take 25 mg by mouth daily.     No current facility-administered medications for this visit.     Family History  Problem Relation Age of Onset  . Arthritis Mother   . Diabetes Mother   . Colon cancer Mother 84  . Lung cancer Father   . Hypertension Father   . Heart attack Father   .  Breast cancer Neg Hx     ROS:  Pertinent items are noted in HPI.  Otherwise, a comprehensive ROS was negative.  Exam:   BP 118/68 (BP Location: Right Arm, Patient Position: Sitting, Cuff Size: Normal)   Pulse 84   Resp 16   Ht 5\' 5"  (1.651 m)   Wt 195 lb 6.4 oz (88.6 kg)   BMI 32.52 kg/m     Height: 5\' 5"  (165.1 cm)  Ht Readings from Last 3 Encounters:  08/24/17 5\' 5"  (1.651 m)  05/02/17 5\' 6"  (1.676 m)  01/05/17 5\' 6"  (1.676 m)    General appearance: alert, cooperative and appears stated age Head: Normocephalic, without obvious abnormality, atraumatic Neck: no adenopathy, supple, symmetrical, trachea midline and thyroid normal to inspection and palpation Lungs: clear to auscultation bilaterally Breasts: normal appearance, no masses or  tenderness Heart: regular rate and rhythm Abdomen: soft, non-tender; bowel sounds normal; no masses,  no organomegaly Extremities: extremities normal, atraumatic, no cyanosis or edema Skin: Skin color, texture, turgor normal. No rashes or lesions Lymph nodes: Cervical, supraclavicular, and axillary nodes normal. No abnormal inguinal nodes palpated Neurologic: Grossly normal   Pelvic: External genitalia:  no lesions except for three skin tags present.  Pt desires removal.              Urethra:  normal appearing urethra with no masses, tenderness or lesions              Bartholins and Skenes: normal                 Vagina: normal appearing vagina with normal color and discharge, no lesions, second degree cystocele              Cervix: no lesions              Pap taken: No. Bimanual Exam:  Uterus:  normal size, contour, position, consistency, mobility, non-tender              Adnexa: normal adnexa and no mass, fullness, tenderness               Rectovaginal: Confirms               Anus:  normal sphincter tone, no lesions  Procedure: Consent obtained.  Skin where locations of skin tags were noted was cleansed with Betadine x3.  1% lidocaine was used beneath the lesions to anesthetize skin.  Using sterile technique with sterile pickups and scissors, 3 lesions were excised.  Silver nitrate was used for excellent hemostasis.  Small dressings were applied.  Chaperone was present for exam.  A:  Well Woman with normal exam Atypical urinary incontinence after voiding and with standing H/O vaginal "mesh" surgery H/O elevated liver enzymes and fatty liver disease  P:   Mammogram guidelines reviewed.  Doing yearly. pap smear with neg HR HPV obtained 1/18.  No pap smear obtained today Celexa 20mg  daily to pharmacy.  #90/4RF. Skin tag removal x 3 today.  No pathology sent. Referral to macdiarmid will be made.  Feel cystoscopy and urodynamics is likely warranted due to atypical urinary  incontinence Urology office and op notes will be obtained, if possible.  Release signed today. return annually or prn  Outside records came.  Had [facility] sling 4/11 and then revision and cystoscopy 5/11 due to urinary retention.

## 2017-08-31 DIAGNOSIS — R350 Frequency of micturition: Secondary | ICD-10-CM | POA: Diagnosis not present

## 2017-08-31 DIAGNOSIS — N3946 Mixed incontinence: Secondary | ICD-10-CM | POA: Diagnosis not present

## 2017-08-31 DIAGNOSIS — N3944 Nocturnal enuresis: Secondary | ICD-10-CM | POA: Diagnosis not present

## 2017-08-31 DIAGNOSIS — R8271 Bacteriuria: Secondary | ICD-10-CM | POA: Diagnosis not present

## 2017-08-31 DIAGNOSIS — R351 Nocturia: Secondary | ICD-10-CM | POA: Diagnosis not present

## 2017-09-12 ENCOUNTER — Encounter: Payer: Self-pay | Admitting: Obstetrics & Gynecology

## 2017-10-25 DIAGNOSIS — L720 Epidermal cyst: Secondary | ICD-10-CM | POA: Diagnosis not present

## 2017-10-25 DIAGNOSIS — L700 Acne vulgaris: Secondary | ICD-10-CM | POA: Diagnosis not present

## 2017-11-03 ENCOUNTER — Other Ambulatory Visit: Payer: Self-pay | Admitting: Primary Care

## 2017-11-03 DIAGNOSIS — Z1231 Encounter for screening mammogram for malignant neoplasm of breast: Secondary | ICD-10-CM

## 2017-11-04 ENCOUNTER — Ambulatory Visit
Admission: RE | Admit: 2017-11-04 | Discharge: 2017-11-04 | Disposition: A | Discharge status: Home / Self Care | Payer: BLUE CROSS/BLUE SHIELD | Source: Ambulatory Visit | Attending: Primary Care | Admitting: Primary Care

## 2017-11-04 DIAGNOSIS — Z1231 Encounter for screening mammogram for malignant neoplasm of breast: Secondary | ICD-10-CM | POA: Diagnosis not present

## 2017-12-07 DIAGNOSIS — L700 Acne vulgaris: Secondary | ICD-10-CM | POA: Diagnosis not present

## 2017-12-07 DIAGNOSIS — L0201 Cutaneous abscess of face: Secondary | ICD-10-CM | POA: Diagnosis not present

## 2017-12-22 ENCOUNTER — Other Ambulatory Visit: Payer: Self-pay | Admitting: Primary Care

## 2017-12-22 DIAGNOSIS — I10 Essential (primary) hypertension: Secondary | ICD-10-CM

## 2018-01-25 DIAGNOSIS — L700 Acne vulgaris: Secondary | ICD-10-CM | POA: Diagnosis not present

## 2018-01-25 DIAGNOSIS — L99 Other disorders of skin and subcutaneous tissue in diseases classified elsewhere: Secondary | ICD-10-CM | POA: Diagnosis not present

## 2018-01-25 DIAGNOSIS — Z872 Personal history of diseases of the skin and subcutaneous tissue: Secondary | ICD-10-CM | POA: Diagnosis not present

## 2018-01-25 DIAGNOSIS — L810 Postinflammatory hyperpigmentation: Secondary | ICD-10-CM | POA: Diagnosis not present

## 2018-01-26 ENCOUNTER — Other Ambulatory Visit: Payer: Self-pay | Admitting: Primary Care

## 2018-01-26 DIAGNOSIS — K219 Gastro-esophageal reflux disease without esophagitis: Secondary | ICD-10-CM

## 2018-03-15 ENCOUNTER — Telehealth: Payer: Self-pay | Admitting: Primary Care

## 2018-03-15 DIAGNOSIS — E781 Pure hyperglyceridemia: Secondary | ICD-10-CM

## 2018-03-15 DIAGNOSIS — I10 Essential (primary) hypertension: Secondary | ICD-10-CM

## 2018-03-15 NOTE — Telephone Encounter (Signed)
Noted, lab slips printed and placed in Chan's inbox.

## 2018-03-15 NOTE — Telephone Encounter (Signed)
Pt is scheduled for CPE on 11/5. 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 She's requesting to pick up lab orders so she can get them done there.

## 2018-03-16 NOTE — Telephone Encounter (Signed)
Per DPR, left detail message for patient that lab orders are ready for pick up. Left in the front office.

## 2018-03-20 ENCOUNTER — Ambulatory Visit: Payer: BLUE CROSS/BLUE SHIELD | Admitting: Medical

## 2018-03-20 ENCOUNTER — Encounter: Payer: Self-pay | Admitting: Medical

## 2018-03-20 VITALS — BP 155/92 | HR 88 | Temp 97.9°F | Resp 16 | Wt 194.2 lb

## 2018-03-20 DIAGNOSIS — T753XXA Motion sickness, initial encounter: Secondary | ICD-10-CM

## 2018-03-20 DIAGNOSIS — F40243 Fear of flying: Secondary | ICD-10-CM

## 2018-03-20 MED ORDER — SCOPOLAMINE 1 MG/3DAYS TD PT72: MEDICATED_PATCH | TRANSDERMAL | 0 refills | Status: DC

## 2018-03-20 NOTE — Patient Instructions (Signed)
Motion Sickness Motion sickness can happen when you travel in a boat, car, or airplane, or when you go on an amusement park ride. You may feel dizzy, feel sick to your stomach (nauseous), or have sweating or belly (abdominal) pain. You may throw up (vomit). These problems usually go away when the motion or traveling stops. Follow these instructions at home: General instructions  Take medicines only as told by your doctor.  If you wear a motion sickness patch, wash your hands after you put the patch on.  Drink enough fluid to keep your pee (urine) clear or pale yellow. Prevention To prevent motion sickness from happening again:  Avoid activities that cause your motion sickness.  Do not eat large meals before or during travel. If you are traveling far, eat small, bland meals.  Do not drink alcohol before or during travel.  Sit in an area of the vehicle with the least motion. ? On an airplane, sit near the wing. Lie back in your seat. ? On a boat, sit near the middle. ? When you ride in a car, sit in the front seat. Avoid the backseat.  Breathe slowly and deeply while you ride in a moving vehicle.  Do not read or focus on nearby objects when you ride in a moving vehicle. ? Watching the horizon can be helpful, especially on a boat. ? In a car, ride in the front seat and look out the front window.  Do not smoke before or during travel. Avoid areas where people are smoking.  Get some fresh air if you can. Open a window when you are riding in a car.  Plan ahead for travel. Ask your doctor if you should take medicines to help prevent motion sickness.  Contact a doctor if:  You still throw up or feel sick to your stomach after 24 hours.  You see blood in your vomit. The blood might be dark red, or it may look like coffee grounds.  You pass out (faint) or you feel dizzy when you stand up.  You have a fever. Get help right away if:  You have bad belly pain or chest pain.  You  have trouble breathing.  You have a bad headache.  You lose feeling (have numbness) or feel weak on one side of your body.  You have trouble speaking. This information is not intended to replace advice given to you by your health care provider. Make sure you discuss any questions you have with your health care provider. Document Released: 06/17/2011 Document Revised: 12/04/2015 Document Reviewed: 06/05/2014 Elsevier Interactive Patient Education  2018 Elsevier Inc.  

## 2018-03-20 NOTE — Progress Notes (Signed)
   Subjective:    Patient ID: Michelle Figueroa, female    DOB: [birth year], 57 y.o.   MRN: [id number]  HPI 57 yo female in non acute distress.  Traveling to [place] by flying then will be boating while on vacation. by boat. Would  like motion sickness medication. History of  Nausea  And vomitng on a smaller boat.   She also requests Xanax 0.25 mg for flying to [place] in the [place] from Sept  27th to Oct 5th.   Forgot to take blood pressure medication this morning., going home at lunch time to get medication.  Blood pressure (!) 155/92, pulse 88, temperature 97.9 F (36.6 C), temperature source Oral, resp. rate 16, weight 194 lb 3.2 oz (88.1 kg), SpO2 99 %.  Review of Systems  Constitutional: Negative.   HENT: Negative.   Respiratory: Negative.   Cardiovascular: Negative.   Neurological: Negative.        Objective:   Physical Exam  Constitutional: She appears well-developed and well-nourished.  HENT:  Head: Normocephalic and atraumatic.  Eyes: Pupils are equal, round, and reactive to light. Conjunctivae and EOM are normal.  Cardiovascular: Normal rate, regular rhythm and normal heart sounds.  Pulmonary/Chest: Effort normal and breath sounds normal.  Skin: Skin is warm and dry.  Psychiatric: She has a normal mood and affect. Her behavior is normal. Judgment and thought content normal.  Nursing note and vitals reviewed.         Assessment & Plan:  Motion sickness on boat Anxiety with flying. Wrote for Xanax 0.25mg  take one before flying #4 no rx.copy in media. Meds ordered this encounter  Medications  . scopolamine (TRANSDERM-SCOP, 1.5 MG,) 1 MG/3DAYS    Sig: Apply to dry skin behind ear. Apply 4 hours before travel. Remove after 72 hours.    Dispense:  4 patch    Refill:  0   Reviewed side effects and when to remove patch if they occur. Has had Xanax in the past for flying anxiety. return to the clinic as needed. Patient verbalizes understanding and has  no questions at discharge.

## 2018-04-21 ENCOUNTER — Other Ambulatory Visit: Payer: Self-pay | Admitting: Primary Care

## 2018-04-21 ENCOUNTER — Other Ambulatory Visit: Payer: Self-pay

## 2018-04-21 DIAGNOSIS — I10 Essential (primary) hypertension: Secondary | ICD-10-CM

## 2018-04-21 MED ORDER — LISINOPRIL 20 MG PO TABS: 20.0000 mg | ORAL_TABLET | Freq: Every day | ORAL | 0 refills | Status: DC

## 2018-04-26 MED ORDER — ESTRACE 0.1 MG/GM VA CREA: 1.0000 | TOPICAL_CREAM | VAGINAL | 0 refills | Status: DC

## 2018-04-27 MED ORDER — ESTRADIOL 0.1 MG/GM VA CREA: 1.0000 | TOPICAL_CREAM | VAGINAL | 0 refills | Status: DC

## 2018-05-09 ENCOUNTER — Other Ambulatory Visit: Payer: BLUE CROSS/BLUE SHIELD

## 2018-05-10 ENCOUNTER — Other Ambulatory Visit: Payer: BLUE CROSS/BLUE SHIELD

## 2018-05-10 DIAGNOSIS — I10 Essential (primary) hypertension: Secondary | ICD-10-CM

## 2018-05-10 DIAGNOSIS — E781 Pure hyperglyceridemia: Secondary | ICD-10-CM

## 2018-05-11 LAB — LIPID PANEL
CHOL/HDL RATIO: 5.2 ratio — AB (ref 0.0–4.4)
Cholesterol, Total: 266 mg/dL — ABNORMAL HIGH (ref 100–199)
HDL: 51 mg/dL (ref >39)
LDL Calculated: 159 mg/dL — ABNORMAL HIGH (ref 0–99)
TRIGLYCERIDES: 281 mg/dL — AB (ref 0–149)
VLDL Cholesterol Cal: 56 mg/dL — ABNORMAL HIGH (ref 5–40)

## 2018-05-11 LAB — COMPREHENSIVE METABOLIC PANEL
A/G RATIO: 1.6 (ref 1.2–2.2)
ALT: 79 IU/L — AB (ref 0–32)
AST: 105 IU/L — ABNORMAL HIGH (ref 0–40)
Albumin: 4.7 g/dL (ref 3.5–5.5)
Alkaline Phosphatase: 112 IU/L (ref 39–117)
BUN/Creatinine Ratio: 17 (ref 9–23)
BUN: 16 mg/dL (ref 6–24)
Bilirubin Total: 0.9 mg/dL (ref 0.0–1.2)
CALCIUM: 9.9 mg/dL (ref 8.7–10.2)
CO2: 21 mmol/L (ref 20–29)
CREATININE: 0.94 mg/dL (ref 0.57–1.00)
Chloride: 101 mmol/L (ref 96–106)
GFR, EST AFRICAN AMERICAN: 79 mL/min/{1.73_m2} (ref >59)
GFR, EST NON AFRICAN AMERICAN: 69 mL/min/{1.73_m2} (ref >59)
Globulin, Total: 2.9 g/dL (ref 1.5–4.5)
Glucose: 173 mg/dL — ABNORMAL HIGH (ref 65–99)
Potassium: 4.6 mmol/L (ref 3.5–5.2)
Sodium: 140 mmol/L (ref 134–144)
TOTAL PROTEIN: 7.6 g/dL (ref 6.0–8.5)

## 2018-05-11 LAB — HGB A1C W/O EAG: Hgb A1c MFr Bld: 8.3 % — ABNORMAL HIGH (ref 4.8–5.6)

## 2018-05-16 ENCOUNTER — Ambulatory Visit (INDEPENDENT_AMBULATORY_CARE_PROVIDER_SITE_OTHER): Payer: BLUE CROSS/BLUE SHIELD | Admitting: Primary Care

## 2018-05-16 ENCOUNTER — Encounter: Payer: Self-pay | Admitting: Primary Care

## 2018-05-16 VITALS — BP 118/72 | HR 84 | Temp 98.3°F | Ht 65.0 in | Wt 190.5 lb

## 2018-05-16 DIAGNOSIS — F411 Generalized anxiety disorder: Secondary | ICD-10-CM | POA: Diagnosis not present

## 2018-05-16 DIAGNOSIS — E781 Pure hyperglyceridemia: Secondary | ICD-10-CM

## 2018-05-16 DIAGNOSIS — Z0000 Encounter for general adult medical examination without abnormal findings: Secondary | ICD-10-CM

## 2018-05-16 DIAGNOSIS — E669 Obesity, unspecified: Secondary | ICD-10-CM

## 2018-05-16 DIAGNOSIS — K649 Unspecified hemorrhoids: Secondary | ICD-10-CM

## 2018-05-16 DIAGNOSIS — K219 Gastro-esophageal reflux disease without esophagitis: Secondary | ICD-10-CM

## 2018-05-16 DIAGNOSIS — E66811 Obesity, class 1: Secondary | ICD-10-CM

## 2018-05-16 DIAGNOSIS — G4700 Insomnia, unspecified: Secondary | ICD-10-CM | POA: Diagnosis not present

## 2018-05-16 DIAGNOSIS — I10 Essential (primary) hypertension: Secondary | ICD-10-CM

## 2018-05-16 DIAGNOSIS — E119 Type 2 diabetes mellitus without complications: Secondary | ICD-10-CM

## 2018-05-16 MED ORDER — METFORMIN HCL 1000 MG PO TABS: 1000.0000 mg | ORAL_TABLET | Freq: Two times a day (BID) | ORAL | 1 refills | Status: DC

## 2018-05-16 MED ORDER — ATORVASTATIN CALCIUM 10 MG PO TABS: 10.0000 mg | ORAL_TABLET | Freq: Every day | ORAL | 3 refills | Status: DC

## 2018-05-16 MED ORDER — CITALOPRAM HYDROBROMIDE 10 MG PO TABS: 10.0000 mg | ORAL_TABLET | Freq: Every day | ORAL | 1 refills | Status: DC

## 2018-05-16 NOTE — Assessment & Plan Note (Signed)
Strongly advised she work on her diet, start with regular exercise.

## 2018-05-16 NOTE — Assessment & Plan Note (Signed)
Overall stable on omeprazole 20 mg, continue same.

## 2018-05-16 NOTE — Progress Notes (Signed)
Subjective:    Patient ID: Michelle Figueroa, female    DOB: [birth year], 57 y.o.   MRN: [id number]  HPI  Michelle Figueroa is a 57 year old female who presents today for complete physical.  Immunizations: -Tetanus: Completed in 2017 -Influenza: Completed this season  Diet: She endorses a fair diet. Breakfast: Skips Lunch: Sandwich, Furniture conservator/restorer: Restaurants 3 nights weekly, grilled protein, salad, starch Snacks: None Desserts: None Beverages: Little liquid intake. Sweet tea, soda, coffee with half and half, water.   Exercise: She is not exercising.  Eye exam: Completed in October.  Dental exam: Completes semi-annually  Colonoscopy: Completed in 2015, due in 2020 Pap Smear: Completed in 2018 Mammogram: Completed in April 2019 Hep C Screen: Negative in 2017  BP Readings from Last 3 Encounters:  05/16/18 118/72  03/20/18 (!) 155/92  08/24/17 118/68   The 10-year ASCVD risk score Mikey Bussing DC Jr., et al., 2013) is: 3.6%   Values used to calculate the score:     Age: 49 years     Sex: Female     Is Non-Hispanic African American: No     Diabetic: No     Tobacco smoker: No     Systolic Blood Pressure: 409 mmHg     Is BP treated: Yes     HDL Cholesterol: 51 mg/dL     Total Cholesterol: 266 mg/dL   Review of Systems  Constitutional: Negative for unexpected weight change.  HENT: Negative for rhinorrhea.   Respiratory: Negative for cough and shortness of breath.   Cardiovascular: Negative for chest pain.  Gastrointestinal: Negative for constipation and diarrhea.  Genitourinary: Negative for difficulty urinating and menstrual problem.  Musculoskeletal: Negative for arthralgias and myalgias.  Skin: Negative for rash.  Allergic/Immunologic: Negative for environmental allergies.  Neurological: Negative for dizziness, numbness and headaches.  Psychiatric/Behavioral: Negative for suicidal ideas.       Doing well on citalopram and has cut dose to 10 mg.       Past Medical  History:  Diagnosis Date  . Diverticulosis    noted on colonoscopy  . Essential hypertension   . GERD (gastroesophageal reflux disease)   . Hemorrhoids   . Insomnia      Social History   Socioeconomic History  . Marital status: Married    Spouse name: Not on file  . Number of children: Not on file  . Years of education: Not on file  . Highest education level: Not on file  Occupational History  . Not on file  Social Needs  . Financial resource strain: Not on file  . Food insecurity:    Worry: Not on file    Inability: Not on file  . Transportation needs:    Medical: Not on file    Non-medical: Not on file  Tobacco Use  . Smoking status: Never Smoker  . Smokeless tobacco: Never Used  Substance and Sexual Activity  . Alcohol use: Yes    Alcohol/week: 3.0 standard drinks    Types: 3 Standard drinks or equivalent per week  . Drug use: No  . Sexual activity: Yes    Birth control/protection: Post-menopausal  Lifestyle  . Physical activity:    Days per week: Not on file    Minutes per session: Not on file  . Stress: Not on file  Relationships  . Social connections:    Talks on phone: Not on file    Gets together: Not on file    Attends religious service: Not  on file    Active member of club or organization: Not on file    Attends meetings of clubs or organizations: Not on file    Relationship status: Not on file  . Intimate partner violence:    Fear of current or ex partner: Not on file    Emotionally abused: Not on file    Physically abused: Not on file    Forced sexual activity: Not on file  Other Topics Concern  . Not on file  Social History Narrative   Married.   2 children.   Works in [employer] at [employer].    Enjoys traveling, spending time with family.     Past Surgical History:  Procedure Laterality Date  . APPENDECTOMY  2009  . CERVICAL FUSION  2010  . CESAREAN SECTION    . CHOLECYSTECTOMY  2000  . ENDOMETRIAL ABLATION    .  GYNECOLOGIC CRYOSURGERY    . RETROPUBIC SLING  10/2009   Dr. Davis Gourd at [facility]  . REVISION URINARY SLING  11/19/2009   with cysto due to urinary retension    Family History  Problem Relation Age of Onset  . Arthritis Mother   . Diabetes Mother   . Colon cancer Mother 32  . Lung cancer Father   . Hypertension Father   . Heart attack Father   . Breast cancer Neg Hx     Allergies  Allergen Reactions  . Erythromycin Ethylsuccinate Rash  . Oxycodone-Acetaminophen Nausea Only    Current Outpatient Medications on File Prior to Visit  Medication Sig Dispense Refill  . estradiol (ESTRACE VAGINAL) 0.1 MG/GM vaginal cream Place 1 Applicatorful vaginally 3 (three) times a week. 42.5 g 0  . lisinopril (PRINIVIL,ZESTRIL) 20 MG tablet Take 1 tablet (20 mg total) by mouth daily. NEED APPOINTMENT FOR  ANY MORE REILLS 30 tablet 0  . omeprazole (PRILOSEC) 20 MG capsule TAKE 1 CAPSULE BY MOUTH DAILY 90 capsule 1  . scopolamine (TRANSDERM-SCOP, 1.5 MG,) 1 MG/3DAYS Apply to dry skin behind ear. Apply 4 hours before travel. Remove after 72 hours. 4 patch 0  . spironolactone (ALDACTONE) 25 MG tablet Take 25 mg by mouth daily.     No current facility-administered medications on file prior to visit.     BP 118/72   Pulse 84   Temp 98.3 F (36.8 C) (Oral)   Ht 5\' 5"  (1.651 m)   Wt 190 lb 8 oz (86.4 kg)   SpO2 97%   BMI 31.70 kg/m    Objective:   Physical Exam  Constitutional: She is oriented to person, place, and time. She appears well-nourished.  HENT:  Mouth/Throat: No oropharyngeal exudate.  Eyes: Pupils are equal, round, and reactive to light. EOM are normal.  Neck: Neck supple. No thyromegaly present.  Cardiovascular: Normal rate and regular rhythm.  Respiratory: Effort normal and breath sounds normal.  GI: Soft. Bowel sounds are normal. There is no tenderness.  Musculoskeletal: Normal range of motion.  Neurological: She is alert and oriented to person, place, and time.   Skin: Skin is warm and dry.  Psychiatric: She has a normal mood and affect.           Assessment & Plan:

## 2018-05-16 NOTE — Assessment & Plan Note (Signed)
Intermittent rectal bleeding with constipation.  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 Non compliant to stool softeners.

## 2018-05-16 NOTE — Assessment & Plan Note (Signed)
New diagnosis with A1C of 8.3. Discussed diagnosis of diabetes and the importance of weight loss with improvement in diet and regular exercise.   Rx for Metformin 1000 mg BID sent to pharmacy. Discussed to start with 1 tablet daily for 2 weeks, then 1 tablet BID.   Foot exam today. Eye exam UTD, just had dilated eye exam. Managed on Ace, statin initiated today.  Follow up in 3 months.

## 2018-05-16 NOTE — Assessment & Plan Note (Signed)
Stable in the office today, continue lisinopril 20 mg. BMP reviewed.  

## 2018-05-16 NOTE — Assessment & Plan Note (Signed)
Immunizations UTD. Pap smear and mammogram UTD. Colonoscopy UTD and due in 2020. Discussed to work on diet, start with regular exercise. Exam unremarkable. Labs reviewed. Follow up in 1 year for CPE.

## 2018-05-16 NOTE — Assessment & Plan Note (Signed)
Recent panel with LDL of 156, Trigs of 281. Given new diagnosis of type 2 diabetes, will treat with low dose atorvastatin.   Rx for atorvastatin 10 mg sent to pharmacy. Repeat lipids and LFT's in 6 weeks.

## 2018-05-16 NOTE — Patient Instructions (Signed)
Start metformin tablets for diabetes. Take 1 tablet by mouth once daily with breakfast for 2 weeks, then increase to 1 tablet twice daily thereafter.  I sent a prescription for citalopram 10 mg tablets to your pharmacy.   Start atorvastatin 10 mg tablets for cholesterol. Take once daily.   Start exercising. You should be getting 150 minutes of moderate intensity exercise weekly.  Reduce restaurant food, sweet tea, soda. Ensure you are consuming 64 ounces of water daily. Increase consumption of vegetables, whole grains, lean protein.  Schedule a lab only appointment in 6 weeks for repeat cholesterol levels. Make sure to fast 4 hours prior. Water and black coffee only.  Please schedule a follow up appointment in 3 months for diabetes check.  It was a pleasure to see you today!   Diabetes Mellitus and Nutrition When you have diabetes (diabetes mellitus), it is very important to have healthy eating habits because your blood sugar (glucose) levels are greatly affected by what you eat and drink. Eating healthy foods in the appropriate amounts, at about the same times every day, can help you:  Control your blood glucose.  Lower your risk of heart disease.  Improve your blood pressure.  Reach or maintain a healthy weight.  Every person with diabetes is different, and each person has different needs for a meal plan. Your health care provider may recommend that you work with a diet and nutrition specialist (dietitian) to make a meal plan that is best for you. Your meal plan may vary depending on factors such as:  The calories you need.  The medicines you take.  Your weight.  Your blood glucose, blood pressure, and cholesterol levels.  Your activity level.  Other health conditions you have, such as heart or kidney disease.  How do carbohydrates affect me? Carbohydrates affect your blood glucose level more than any other type of food. Eating carbohydrates naturally increases the amount  of glucose in your blood. Carbohydrate counting is a method for keeping track of how many carbohydrates you eat. Counting carbohydrates is important to keep your blood glucose at a healthy level, especially if you use insulin or take certain oral diabetes medicines. It is important to know how many carbohydrates you can safely have in each meal. This is different for every person. Your dietitian can help you calculate how many carbohydrates you should have at each meal and for snack. Foods that contain carbohydrates include:  Bread, cereal, rice, pasta, and crackers.  Potatoes and corn.  Peas, beans, and lentils.  Milk and yogurt.  Fruit and juice.  Desserts, such as cakes, cookies, ice cream, and candy.  How does alcohol affect me? Alcohol can cause a sudden decrease in blood glucose (hypoglycemia), especially if you use insulin or take certain oral diabetes medicines. Hypoglycemia can be a life-threatening condition. Symptoms of hypoglycemia (sleepiness, dizziness, and confusion) are similar to symptoms of having too much alcohol. If your health care provider says that alcohol is safe for you, follow these guidelines:  Limit alcohol intake to no more than 1 drink per day for nonpregnant women and 2 drinks per day for men. One drink equals 12 oz of beer, 5 oz of wine, or 1 oz of hard liquor.  Do not drink on an empty stomach.  Keep yourself hydrated with water, diet soda, or unsweetened iced tea.  Keep in mind that regular soda, juice, and other mixers may contain a lot of sugar and must be counted as carbohydrates.  What are  tips for following this plan? Reading food labels  Start by checking the serving size on the label. The amount of calories, carbohydrates, fats, and other nutrients listed on the label are based on one serving of the food. Many foods contain more than one serving per package.  Check the total grams (g) of carbohydrates in one serving. You can calculate the  number of servings of carbohydrates in one serving by dividing the total carbohydrates by 15. For example, if a food has 30 g of total carbohydrates, it would be equal to 2 servings of carbohydrates.  Check the number of grams (g) of saturated and trans fats in one serving. Choose foods that have low or no amount of these fats.  Check the number of milligrams (mg) of sodium in one serving. Most people should limit total sodium intake to less than 2,300 mg per day.  Always check the nutrition information of foods labeled as "low-fat" or "nonfat". These foods may be higher in added sugar or refined carbohydrates and should be avoided.  Talk to your dietitian to identify your daily goals for nutrients listed on the label. Shopping  Avoid buying canned, premade, or processed foods. These foods tend to be high in fat, sodium, and added sugar.  Shop around the outside edge of the grocery store. This includes fresh fruits and vegetables, bulk grains, fresh meats, and fresh dairy. Cooking  Use low-heat cooking methods, such as baking, instead of high-heat cooking methods like deep frying.  Cook using healthy oils, such as olive, canola, or sunflower oil.  Avoid cooking with butter, cream, or high-fat meats. Meal planning  Eat meals and snacks regularly, preferably at the same times every day. Avoid going long periods of time without eating.  Eat foods high in fiber, such as fresh fruits, vegetables, beans, and whole grains. Talk to your dietitian about how many servings of carbohydrates you can eat at each meal.  Eat 4-6 ounces of lean protein each day, such as lean meat, chicken, fish, eggs, or tofu. 1 ounce is equal to 1 ounce of meat, chicken, or fish, 1 egg, or 1/4 cup of tofu.  Eat some foods each day that contain healthy fats, such as avocado, nuts, seeds, and fish. Lifestyle   Check your blood glucose regularly.  Exercise at least 30 minutes 5 or more days each week, or as told by  your health care provider.  Take medicines as told by your health care provider.  Do not use any products that contain nicotine or tobacco, such as cigarettes and e-cigarettes. If you need help quitting, ask your health care provider.  Work with a Social worker or diabetes educator to identify strategies to manage stress and any emotional and social challenges. What are some questions to ask my health care provider?  Do I need to meet with a diabetes educator?  Do I need to meet with a dietitian?  What number can I call if I have questions?  When are the best times to check my blood glucose? Where to find more information:  American Diabetes Association: diabetes.org/food-and-fitness/food  Academy of Nutrition and Dietetics: PokerClues.dk  [employer] of Diabetes and Digestive and Kidney Diseases (NIH): ContactWire.be Summary  A healthy meal plan will help you control your blood glucose and maintain a healthy lifestyle.  Working with a diet and nutrition specialist (dietitian) can help you make a meal plan that is best for you.  Keep in mind that carbohydrates and alcohol have immediate effects on  your blood glucose levels. It is important to count carbohydrates and to use alcohol carefully. This information is not intended to replace advice given to you by your health care provider. Make sure you discuss any questions you have with your health care provider. Document Released: 03/25/2005 Document Revised: 08/02/2016 Document Reviewed: 08/02/2016 Elsevier Interactive Patient Education  [employer].

## 2018-05-16 NOTE — Assessment & Plan Note (Signed)
Patient is doing 10 mg of Celexa, feels well managed. Will send Rx for Celexa 10 mg daily. Denies SI/HI.

## 2018-05-16 NOTE — Assessment & Plan Note (Signed)
No problems. Doing well on Celexa.

## 2018-05-30 ENCOUNTER — Other Ambulatory Visit: Payer: Self-pay | Admitting: Primary Care

## 2018-05-30 DIAGNOSIS — K219 Gastro-esophageal reflux disease without esophagitis: Secondary | ICD-10-CM

## 2018-05-31 ENCOUNTER — Ambulatory Visit: Payer: BLUE CROSS/BLUE SHIELD | Admitting: Medical

## 2018-05-31 ENCOUNTER — Encounter: Payer: Self-pay | Admitting: Medical

## 2018-05-31 VITALS — BP 127/81 | HR 85 | Temp 98.6°F | Resp 16 | Wt 189.0 lb

## 2018-05-31 DIAGNOSIS — M79645 Pain in left finger(s): Secondary | ICD-10-CM

## 2018-05-31 MED ORDER — IBUPROFEN 800 MG PO TABS: 800.0000 mg | ORAL_TABLET | Freq: Three times a day (TID) | ORAL | 0 refills | Status: DC | PRN

## 2018-05-31 NOTE — Patient Instructions (Signed)
Joint Pain Joint pain can be caused by many things. The joint can be bruised, infected, weak from aging, or sore from exercise. The pain will probably go away if you follow your doctor's instructions for home care. If your joint pain continues, more tests may be needed to help find the cause of your condition. Follow these instructions at home: Watch your condition for any changes. Follow these instructions as told to lessen the pain that you are feeling:  Take medicines only as told by your doctor.  Rest the sore joint for as long as told by your doctor. If your doctor tells you to, raise (elevate) the painful joint above the level of your heart while you are sitting or lying down.  Do not do things that cause pain or make the pain worse.  If told, put ice on the painful area: ? Put ice in a plastic bag. ? Place a towel between your skin and the bag. ? Leave the ice on for 20 minutes, 2-3 times per day.  Wear an elastic bandage, splint, or sling as told by your doctor. Loosen the bandage or splint if your fingers or toes lose feeling (become numb) and tingle, or if they turn cold and blue.  Begin exercising or stretching the joint as told by your doctor. Ask your doctor what types of exercise are safe for you.  Keep all follow-up visits as told by your doctor. This is important.  Contact a doctor if:  Your pain gets worse and medicine does not help it.  Your joint pain does not get better in 3 days.  You have more bruising or swelling.  You have a fever.  You lose 10 pounds (4.5 kg) or more without trying. Get help right away if:  You are not able to move the joint.  Your fingers or toes become numb or they turn cold and blue. This information is not intended to replace advice given to you by your health care provider. Make sure you discuss any questions you have with your health care provider. Document Released: 06/16/2009 Document Revised: 12/04/2015 Document Reviewed:  04/09/2014 Elsevier Interactive Patient Education  2018 Elsevier Inc.  

## 2018-05-31 NOTE — Progress Notes (Signed)
   Subjective:    Patient ID: Michelle Figueroa, female    DOB: 1961-06-21, 57 y.o.   MRN: [id number]  HPI 57 yo female in non acute distress. Left wrist pain "bothering her" x   4-6 weeks  , swelling x  2 days pain level 4/10. Has not tried anything for pain. .  Denies trauma, denies numbness or tingling.  Is right handed.   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   History of starting Glucophage  2 weeks ago, diagnosed  With Diabettes Mellitus and also placed on Lipitor (statin) at the same time, denies joint pain all over..   Review of Systems  Constitutional: Negative for chills, fatigue and fever.  Respiratory: Negative for cough and shortness of breath.   Cardiovascular: Negative for chest pain.  Neurological: Positive for light-headedness (on Friday checked her sugar 73, ate fruit and a hamburger patty.).       Objective:   Physical Exam  Constitutional: She is oriented to person, place, and time. She appears well-developed and well-nourished.  HENT:  Head: Normocephalic and atraumatic.  Eyes: Pupils are equal, round, and reactive to light. Conjunctivae and EOM are normal.  Neck: Normal range of motion.  Pulmonary/Chest: Effort normal.  Musculoskeletal: Normal range of motion. She exhibits edema.       Arms: Neurological: She is alert and oriented to person, place, and time.  Skin: Skin is warm and dry. Capillary refill takes less than 2 seconds.  Psychiatric: She has a normal mood and affect. Her behavior is normal. Judgment and thought content normal.     Mild swelling noted on dorsum of left hand. Swelling in the thenar area , FROM but painful with movement of thumb..  Negative Phalens and Tinels test. 2+ radial pulse.no erythema, no wounds.     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 Thumb splint placed on patient, ice and elevate. To  avoid using the thumb. Return in one week for recheck. Meds ordered this encounter  Medications  . ibuprofen (ADVIL,MOTRIN) 800 MG tablet    Sig: Take 1 tablet (800 mg total) by mouth every 8 (eight) hours as needed. Take with food    Dispense:  30 tablet    Refill:  0  Patient verbalizes understandingand has no questions at discharge.

## 2018-07-10 DIAGNOSIS — E119 Type 2 diabetes mellitus without complications: Secondary | ICD-10-CM

## 2018-07-10 MED ORDER — METFORMIN HCL ER 500 MG PO TB24: 1000.0000 mg | ORAL_TABLET | Freq: Every day | ORAL | 0 refills | Status: DC

## 2018-07-18 DIAGNOSIS — Z1283 Encounter for screening for malignant neoplasm of skin: Secondary | ICD-10-CM | POA: Diagnosis not present

## 2018-07-18 DIAGNOSIS — D2230 Melanocytic nevi of unspecified part of face: Secondary | ICD-10-CM | POA: Diagnosis not present

## 2018-07-18 DIAGNOSIS — L739 Follicular disorder, unspecified: Secondary | ICD-10-CM | POA: Diagnosis not present

## 2018-07-18 DIAGNOSIS — L720 Epidermal cyst: Secondary | ICD-10-CM | POA: Diagnosis not present

## 2018-08-02 DIAGNOSIS — I10 Essential (primary) hypertension: Secondary | ICD-10-CM

## 2018-08-02 DIAGNOSIS — E119 Type 2 diabetes mellitus without complications: Secondary | ICD-10-CM

## 2018-08-02 DIAGNOSIS — E785 Hyperlipidemia, unspecified: Secondary | ICD-10-CM

## 2018-08-08 ENCOUNTER — Other Ambulatory Visit: Payer: BLUE CROSS/BLUE SHIELD

## 2018-08-08 DIAGNOSIS — E781 Pure hyperglyceridemia: Secondary | ICD-10-CM

## 2018-08-09 LAB — HEPATIC FUNCTION PANEL
ALT: 48 IU/L — ABNORMAL HIGH (ref 0–32)
AST: 40 IU/L (ref 0–40)
Albumin: 4.4 g/dL (ref 3.8–4.9)
Alkaline Phosphatase: 94 IU/L (ref 39–117)
Bilirubin Total: 0.6 mg/dL (ref 0.0–1.2)
Bilirubin, Direct: 0.18 mg/dL (ref 0.00–0.40)
Total Protein: 6.8 g/dL (ref 6.0–8.5)

## 2018-08-09 LAB — LIPID PANEL
Chol/HDL Ratio: 2.8 ratio (ref 0.0–4.4)
Cholesterol, Total: 163 mg/dL (ref 100–199)
HDL: 59 mg/dL (ref >39)
LDL Calculated: 76 mg/dL (ref 0–99)
Triglycerides: 139 mg/dL (ref 0–149)
VLDL Cholesterol Cal: 28 mg/dL (ref 5–40)

## 2018-08-16 ENCOUNTER — Ambulatory Visit: Payer: BLUE CROSS/BLUE SHIELD | Admitting: Primary Care

## 2018-08-16 ENCOUNTER — Encounter: Payer: Self-pay | Admitting: Primary Care

## 2018-08-16 VITALS — BP 136/84 | HR 82 | Temp 98.2°F | Ht 65.0 in | Wt 190.5 lb

## 2018-08-16 DIAGNOSIS — E119 Type 2 diabetes mellitus without complications: Secondary | ICD-10-CM | POA: Diagnosis not present

## 2018-08-16 DIAGNOSIS — I10 Essential (primary) hypertension: Secondary | ICD-10-CM | POA: Diagnosis not present

## 2018-08-16 DIAGNOSIS — E785 Hyperlipidemia, unspecified: Secondary | ICD-10-CM | POA: Diagnosis not present

## 2018-08-16 DIAGNOSIS — Z23 Encounter for immunization: Secondary | ICD-10-CM

## 2018-08-16 LAB — POCT GLYCOSYLATED HEMOGLOBIN (HGB A1C): Hemoglobin A1C: 5.6 % (ref 4.0–5.6)

## 2018-08-16 MED ORDER — LISINOPRIL 20 MG PO TABS: 20.0000 mg | ORAL_TABLET | Freq: Every day | ORAL | 0 refills | Status: DC

## 2018-08-16 NOTE — Assessment & Plan Note (Signed)
New diagnosis 3 months ago with A1c of 8.3. Compliant to metformin 1000 mg every morning.  A1c today 5.6, commended her on the success. Encouraged her to continue working on regular exercise and a healthy diet.  Given dizziness, will reduce metformin to 500 mg once daily.  She will update. Continue statin and ACE. Foot exam up-to-date. Pneumonia vaccination provided today.  Repeat A1c in 3 months.  Follow-up in 6 months for reevaluation.

## 2018-08-16 NOTE — Assessment & Plan Note (Signed)
Improved on atorvastatin, continue same. 

## 2018-08-16 NOTE — Assessment & Plan Note (Signed)
Borderline on lisinopril 20 mg. We will have her start monitoring blood pressure and notify us of readings at or above 135/90. Consider increasing lisinopril versus adding treatment.

## 2018-08-16 NOTE — Progress Notes (Signed)
Subjective:    Patient ID: Michelle Figueroa, female    DOB: 1960/09/04, 58 y.o.   MRN: [id number]  HPI  Michelle Figueroa is a 58 year old female who presents today for follow up of type 2 diabetes.  Current medications include: Metformin ER 1000 mg daily  She is checking her blood glucose once daily and is getting readings of: AM fasting: 110's-120's  Highest reading: 130 Lowest reading: 70  Last A1C: 8.3 in late October 2019, 5.6 today Last Eye Exam: Completed in October 2019 Last Foot Exam: Completed in November 2019 Pneumonia Vaccination:  ACE/ARB: ACE Statin: atorvastatin   Diet currently consists of:  Breakfast: Protein bar, protein shake Lunch: Fast food, salad Dinner: Grilled protein, starch, vegetables Snacks: None Desserts: None Beverages: Little liquids. Coffee, un-sweet tea, wine  Exercise: She is walking on her treadmill most days of the week starting 2 weeks ago. She is going for 20 minutes.    BP Readings from Last 3 Encounters:  08/16/18 136/84  05/31/18 127/81  05/16/18 118/72      Review of Systems  Respiratory: Negative for shortness of breath.   Cardiovascular: Negative for chest pain.  Gastrointestinal: Negative for nausea.  Neurological: Positive for dizziness. Negative for headaches.       Has begun to feel intermittent dizziness since taking metformin 1000 mg daily.       Past Medical History:  Diagnosis Date  . Diverticulosis    noted on colonoscopy  . Essential hypertension   . GERD (gastroesophageal reflux disease)   . Hemorrhoids   . Insomnia      Social History   Socioeconomic History  . Marital status: Married    Spouse name: Not on file  . Number of children: Not on file  . Years of education: Not on file  . Highest education level: Not on file  Occupational History  . Not on file  Social Needs  . Financial resource strain: Not on file  . Food insecurity:    Worry: Not on file    Inability: Not on file  .  Transportation needs:    Medical: Not on file    Non-medical: Not on file  Tobacco Use  . Smoking status: Never Smoker  . Smokeless tobacco: Never Used  Substance and Sexual Activity  . Alcohol use: Yes    Alcohol/week: 3.0 standard drinks    Types: 3 Standard drinks or equivalent per week  . Drug use: No  . Sexual activity: Yes    Birth control/protection: Post-menopausal  Lifestyle  . Physical activity:    Days per week: Not on file    Minutes per session: Not on file  . Stress: Not on file  Relationships  . Social connections:    Talks on phone: Not on file    Gets together: Not on file    Attends religious service: Not on file    Active member of club or organization: Not on file    Attends meetings of clubs or organizations: Not on file    Relationship status: Not on file  . Intimate partner violence:    Fear of current or ex partner: Not on file    Emotionally abused: Not on file    Physically abused: Not on file    Forced sexual activity: Not on file  Other Topics Concern  . Not on file  Social History Narrative   Married.   2 children.   Works in [employer] at  [place].    Enjoys traveling, spending time with family.     Past Surgical History:  Procedure Laterality Date  . APPENDECTOMY  2009  . CERVICAL FUSION  2010  . CESAREAN SECTION    . CHOLECYSTECTOMY  2000  . ENDOMETRIAL ABLATION    . GYNECOLOGIC CRYOSURGERY    . RETROPUBIC SLING  10/2009   Dr. Davis Gourd at [facility]  . REVISION URINARY SLING  11/19/2009   with cysto due to urinary retension    Family History  Problem Relation Age of Onset  . Arthritis Mother   . Diabetes Mother   . Colon cancer Mother 56  . Lung cancer Father   . Hypertension Father   . Heart attack Father   . Breast cancer Neg Hx     Allergies  Allergen Reactions  . Erythromycin Ethylsuccinate Rash  . Oxycodone-Acetaminophen Nausea Only    Current Outpatient Medications on File Prior to Visit    Medication Sig Dispense Refill  . atorvastatin (LIPITOR) 10 MG tablet Take 1 tablet (10 mg total) by mouth daily. For cholesterol. 90 tablet 3  . citalopram (CELEXA) 10 MG tablet Take 1 tablet (10 mg total) by mouth daily. For anxiety. 90 tablet 1  . estradiol (ESTRACE VAGINAL) 0.1 MG/GM vaginal cream Place 1 Applicatorful vaginally 3 (three) times a week. 42.5 g 0  . metFORMIN (GLUCOPHAGE-XR) 500 MG 24 hr tablet Take 2 tablets (1,000 mg total) by mouth daily with breakfast. For diabetes. 180 tablet 0  . omeprazole (PRILOSEC) 20 MG capsule TAKE 1 CAPSULE BY MOUTH EVERY DAY 90 capsule 1   No current facility-administered medications on file prior to visit.     BP 136/84   Pulse 82   Temp 98.2 F (36.8 C) (Oral)   Ht 5\' 5"  (1.651 m)   Wt 190 lb 8 oz (86.4 kg)   SpO2 98%   BMI 31.70 kg/m    Objective:   Physical Exam  Constitutional: She appears well-nourished.  Neck: Neck supple.  Cardiovascular: Normal rate and regular rhythm.  Respiratory: Effort normal and breath sounds normal.  Skin: Skin is warm and dry.           Assessment & Plan:

## 2018-08-16 NOTE — Patient Instructions (Signed)
Start monitoring your blood pressure daily, around the same time of day, for the next 2 weeks.  Ensure that you have rested for 30 minutes prior to checking your blood pressure. Record your readings and send them to me via my chart.   Reduce your Metformin to one tablet daily as discussed. Update me regarding your dizziness.  Ensure you are consuming 64 ounces of water daily.  Continue to work on [employer]. Continue exercising. You should be getting 150 minutes of moderate intensity exercise weekly.  Schedule a lab only appointment for 3 months to repeat your A1C.  It was a pleasure to see you today!

## 2018-09-11 ENCOUNTER — Other Ambulatory Visit: Payer: Self-pay

## 2018-09-11 DIAGNOSIS — I10 Essential (primary) hypertension: Secondary | ICD-10-CM

## 2018-09-11 MED ORDER — LISINOPRIL 20 MG PO TABS: 20.0000 mg | ORAL_TABLET | Freq: Every day | ORAL | 0 refills | Status: DC

## 2018-09-11 NOTE — Telephone Encounter (Signed)
Pt last seen 08/16/18 and f/u in 6 mths. Refilled per protocol to total care/

## 2018-09-27 ENCOUNTER — Ambulatory Visit: Payer: BLUE CROSS/BLUE SHIELD | Admitting: Medical

## 2018-09-27 ENCOUNTER — Encounter: Payer: Self-pay | Admitting: Medical

## 2018-09-27 ENCOUNTER — Other Ambulatory Visit: Payer: Self-pay

## 2018-09-27 VITALS — BP 141/83 | HR 83 | Temp 98.4°F | Resp 16 | Wt 194.4 lb

## 2018-09-27 DIAGNOSIS — R600 Localized edema: Secondary | ICD-10-CM

## 2018-09-27 DIAGNOSIS — M2550 Pain in unspecified joint: Secondary | ICD-10-CM

## 2018-09-27 NOTE — Progress Notes (Signed)
Subjective:    Patient ID: Michelle Figueroa, female    DOB: 19-Jul-1960, 58 y.o.   MRN: [id number]  HPI 58 yo female in non acute distress. Swelling in ankles x 1 week, then Saturday increassed swelling in lower legs and feet.  Friday was on feet 5-6 hours, doesn't usually stay on her feet all day.  Legs and ankles feels painful 4/10. Last week she felt her face puffy and bra feels tighter.  Today weighed 194 lbs.( weight 195 lbs  08/24/17). No abdominal swelling. Patient anxious about swelling.Denies chest pain or shortness of breath or trouble swallowing.   Last nights dinner.  Grilled pork chop  Green peas and a baked potatoes , salts foods.  Has increased water intake.   Denies any new foods or medications. Blood pressure (!) 141/83, pulse 83, temperature 98.4 F (36.9 C), temperature source Tympanic, resp. rate 16, weight 194 lb 6.4 oz (88.2 kg), SpO2 100 %.   Allergies  Allergen Reactions  . Erythromycin Ethylsuccinate Rash  . Oxycodone-Acetaminophen Nausea Only    Current Outpatient Medications:  .  atorvastatin (LIPITOR) 10 MG tablet, Take 1 tablet (10 mg total) by mouth daily. For cholesterol., Disp: 90 tablet, Rfl: 3 .  citalopram (CELEXA) 10 MG tablet, Take 1 tablet (10 mg total) by mouth daily. For anxiety., Disp: 90 tablet, Rfl: 1 .  estradiol (ESTRACE VAGINAL) 0.1 MG/GM vaginal cream, Place 1 Applicatorful vaginally 3 (three) times a week., Disp: 42.5 g, Rfl: 0 .  lisinopril (PRINIVIL,ZESTRIL) 20 MG tablet, Take 1 tablet (20 mg total) by mouth daily. For blood pressure., Disp: 90 tablet, Rfl: 0 .  metFORMIN (GLUCOPHAGE-XR) 500 MG 24 hr tablet, Take 2 tablets (1,000 mg total) by mouth daily with breakfast. For diabetes. (Patient taking differently: Take 500 mg by mouth daily with breakfast. For diabetes.), Disp: 180 tablet, Rfl: 0 .  omeprazole (PRILOSEC) 20 MG capsule, TAKE 1 CAPSULE BY MOUTH EVERY DAY, Disp: 90 capsule, Rfl: 1    Past Medical History:  Diagnosis  Date  . Diverticulosis    noted on colonoscopy  . Essential hypertension   . GERD (gastroesophageal reflux disease)   . Hemorrhoids   . Insomnia   patinet also has a history of  Hypertension and Diabetes on Metformin. Review of Systems  Constitutional: Positive for fatigue. Negative for chills and fever.  HENT: Negative for congestion, ear pain and sore throat.   Respiratory: Negative for cough, shortness of breath and wheezing.   Cardiovascular: Positive for leg swelling. Negative for chest pain.  Gastrointestinal: Positive for abdominal pain (stomach pain epigastric / with nausesa but now resolved. ).       Objective:   Physical Exam Vitals signs and nursing note reviewed.  Constitutional:      Appearance: Normal appearance. She is obese.  HENT:     Head: Normocephalic and atraumatic.  Eyes:     Extraocular Movements: Extraocular movements intact.     Conjunctiva/sclera: Conjunctivae normal.     Pupils: Pupils are equal, round, and reactive to light.  Neck:     Musculoskeletal: Normal range of motion.  Cardiovascular:     Rate and Rhythm: Normal rate and regular rhythm.     Heart sounds: No murmur. No friction rub. No gallop.   Pulmonary:     Effort: Pulmonary effort is normal. No respiratory distress.     Breath sounds: Normal breath sounds. No stridor. No wheezing, rhonchi or rales.  Abdominal:     General: Abdomen  is flat. Bowel sounds are normal. There is no distension.     Palpations: Abdomen is soft. There is no mass.     Tenderness: There is no abdominal tenderness. There is no guarding or rebound.     Hernia: No hernia is present.  Musculoskeletal:        General: Swelling (lower legs and ankle area, hands look slightly swollen.) present.  Lymphadenopathy:     Cervical: No cervical adenopathy.  Skin:    General: Skin is warm and dry.     Capillary Refill: Capillary refill takes less than 2 seconds.  Neurological:     General: No focal deficit present.      Mental Status: She is alert and oriented to person, place, and time.  Psychiatric:        Mood and Affect: Mood normal.        Behavior: Behavior normal.        Thought Content: Thought content normal.        Judgment: Judgment normal.     Non pitting edema bilaterally 2+PT and DP bilaterally, skin wnl , no varicosities or lesions.  I cannot tell if the patients face is swollen. Her hads do look swollen     Assessment & Plan:  Lower leg swelling bilateral , Joint pain, swelling in hands. Questionable if patient is having a low grade allergic reaction to her meds or something else or possible an immune reaction, or sitting more and edema in legs started.   Reassured patient that it is not her heart or a blood clot, she is anxious about these aspects.  OTC Zyrtec or Claritin during the day,  And OTC Benadryl 25 mg at night. OTC Motrin  400mg  everty 6 hours with food x 3 days.  Elevate legs. Lukewarm showers not hot. Follow up with your PCP in 3-5 days. Chest pain or shortness of breath go to the nearest  Emergency Department. Follow up with your doctor in  3-5 days.  I will call patient if labs are abnormal.    Patient verbalizes understanding and has no questions at discharge.

## 2018-09-27 NOTE — Patient Instructions (Addendum)
OTC Zyrtec or Claritin,  And OTC Benadryl 25 mg at night. OTC Motrin  400mg  everty 6 hours with food x 3 days.  Elevate legs. Lukewarm showers not hot. Follow up with your PCP in 3-5 days. Chest pain or shortness of breath go to the closest  Emergency Department.     Joint Pain  Joint pain can be caused by many things. It is likely to go away if you follow instructions from your doctor for taking care of yourself at home. Sometimes, you may need more treatment. Follow these instructions at home: Managing pain, stiffness, and swelling   If told, put ice on the painful area. ? Put ice in a plastic bag. ? Place a towel between your skin and the bag. ? Leave the ice on for 20 minutes, 2-3 times a day.  If told, put heat on the painful area. Do this as often as told by your doctor. Use the heat source that your doctor recommends, such as a moist heat pack or a heating pad. ? Place a towel between your skin and the heat source. ? Leave the heat on for 20-30 minutes. ? Take off the heat if your skin gets bright red. This is especially important if you are unable to feel pain, heat, or cold. You may have a greater risk of getting burned.  Move your fingers or toes below the painful joint often. This helps with stiffness and swelling.  If possible, raise (elevate) the painful joint above the level of your heart while you are sitting or lying down. To do this, try putting a few pillows under the painful joint. Activity  Rest the painful joint for as long as told. Do not do things that cause pain or make your pain worse.  Begin exercising or stretching the affected area, as told by your doctor. Ask your doctor what types of exercise are safe for you. If you have an elastic bandage, sling, or splint:  Wear the device as told by your doctor. Take it off only as told by your doctor.  Loosen the device if your fingers or toes below the joint: ? Tingle. ? Lose feeling (get numb). ? Get cold  and blue.  Keep the device clean.  Ask your doctor if you should take off the device before bathing. You may need to cover it with a watertight covering when you take a bath or a shower. General instructions  Take over-the-counter and prescription medicines only as told by your doctor.  Do not use any products that contain nicotine or tobacco. These include cigarettes and e-cigarettes. If you need help quitting, ask your doctor.  Keep all follow-up visits as told by your doctor. This is important. Contact a doctor if:  You have pain that gets worse and does not get better with medicine.  Your joint pain does not get better in 3 days.  You have more bruising or swelling.  You have a fever.  You lose 10 lb (4.5 kg) or more without trying. Get help right away if:  You cannot move the joint.  Your fingers or toes tingle, lose feeling, or get cold and blue.  You have a fever along with a joint that is red, warm, and swollen. Summary  Joint pain can be caused by many things. It often goes away if you follow instructions from your doctor for taking care of yourself at home.  Rest the painful joint for as long as told. Do not do things  that cause pain or make your pain worse.  Take over-the-counter and prescription medicines only as told by your doctor. This information is not intended to replace advice given to you by your health care provider. Make sure you discuss any questions you have with your health care provider. Document Released: 06/16/2009 Document Revised: 04/13/2017 Document Reviewed: 04/13/2017 Elsevier Interactive Patient Education  2019 [place] DASH stands for "Dietary Approaches to Stop Hypertension." The DASH eating plan is a healthy eating plan that has been shown to reduce high blood pressure (hypertension). It may also reduce your risk for type 2 diabetes, heart disease, and stroke. The DASH eating plan may also help with weight  loss. What are tips for following this plan?  General guidelines  Avoid eating more than 2,300 mg (milligrams) of salt (sodium) a day. If you have hypertension, you may need to reduce your sodium intake to 1,500 mg a day.  Limit alcohol intake to no more than 1 drink a day for nonpregnant women and 2 drinks a day for men. One drink equals 12 oz of beer, 5 oz of wine, or 1 oz of hard liquor.  Work with your health care provider to maintain a healthy body weight or to lose weight. Ask what an ideal weight is for you.  Get at least 30 minutes of exercise that causes your heart to beat faster (aerobic exercise) most days of the week. Activities may include walking, swimming, or biking.  Work with your health care provider or diet and nutrition specialist (dietitian) to adjust your eating plan to your individual calorie needs. Reading food labels   Check food labels for the amount of sodium per serving. Choose foods with less than 5 percent of the Daily Value of sodium. Generally, foods with less than 300 mg of sodium per serving fit into this eating plan.  To find whole grains, look for the word "whole" as the first word in the ingredient list. Shopping  Buy products labeled as "low-sodium" or "no salt added."  Buy fresh foods. Avoid canned foods and premade or frozen meals. Cooking  Avoid adding salt when cooking. Use salt-free seasonings or herbs instead of table salt or sea salt. Check with your health care provider or pharmacist before using salt substitutes.  Do not fry foods. Cook foods using healthy methods such as baking, boiling, grilling, and broiling instead.  Cook with heart-healthy oils, such as olive, canola, soybean, or sunflower oil. Meal planning  Eat a balanced diet that includes: ? 5 or more servings of fruits and vegetables each day. At each meal, try to fill half of your plate with fruits and vegetables. ? Up to 6-8 servings of whole grains each day. ? Less than  6 oz of lean meat, poultry, or fish each day. A 3-oz serving of meat is about the same size as a deck of cards. One egg equals 1 oz. ? 2 servings of low-fat dairy each day. ? A serving of nuts, seeds, or beans 5 times each week. ? Heart-healthy fats. Healthy fats called Omega-3 fatty acids are found in foods such as flaxseeds and coldwater fish, like sardines, salmon, and mackerel.  Limit how much you eat of the following: ? Canned or prepackaged foods. ? Food that is high in trans fat, such as fried foods. ? Food that is high in saturated fat, such as fatty meat. ? Sweets, desserts, sugary drinks, and other foods with added sugar. ?  Full-fat dairy products.  Do not salt foods before eating.  Try to eat at least 2 vegetarian meals each week.  Eat more home-cooked food and less restaurant, buffet, and fast food.  When eating at a restaurant, ask that your food be prepared with less salt or no salt, if possible. What foods are recommended? The items listed may not be a complete list. Talk with your dietitian about what dietary choices are best for you. Grains Whole-grain or whole-wheat bread. Whole-grain or whole-wheat pasta. Brown rice. [place]. Bulgur. Whole-grain and low-sodium cereals. Pita bread. Low-fat, low-sodium crackers. Whole-wheat flour tortillas. Vegetables Fresh or frozen vegetables (raw, steamed, roasted, or grilled). Low-sodium or reduced-sodium tomato and vegetable juice. Low-sodium or reduced-sodium tomato sauce and tomato paste. Low-sodium or reduced-sodium canned vegetables. Fruits All fresh, dried, or frozen fruit. Canned fruit in natural juice (without added sugar). Meat and other protein foods Skinless chicken or [place]. Ground chicken or [place]. Pork with fat trimmed off. Fish and seafood. Egg whites. Dried beans, peas, or lentils. Unsalted nuts, nut butters, and seeds. Unsalted canned beans. Lean cuts of beef with fat trimmed off. Low-sodium, lean deli  meat. Dairy Low-fat (1%) or fat-free (skim) milk. Fat-free, low-fat, or reduced-fat cheeses. Nonfat, low-sodium ricotta or cottage cheese. Low-fat or nonfat yogurt. Low-fat, low-sodium cheese. Fats and oils Soft margarine without trans fats. Vegetable oil. Low-fat, reduced-fat, or light mayonnaise and salad dressings (reduced-sodium). Canola, safflower, olive, soybean, and sunflower oils. Avocado. Seasoning and other foods Herbs. Spices. Seasoning mixes without salt. Unsalted popcorn and pretzels. Fat-free sweets. What foods are not recommended? The items listed may not be a complete list. Talk with your dietitian about what dietary choices are best for you. Grains Baked goods made with fat, such as croissants, muffins, or some breads. Dry pasta or rice meal packs. Vegetables Creamed or fried vegetables. Vegetables in a cheese sauce. Regular canned vegetables (not low-sodium or reduced-sodium). Regular canned tomato sauce and paste (not low-sodium or reduced-sodium). Regular tomato and vegetable juice (not low-sodium or reduced-sodium). Angie Fava. Olives. Fruits Canned fruit in a light or heavy syrup. Fried fruit. Fruit in cream or butter sauce. Meat and other protein foods Fatty cuts of meat. Ribs. Fried meat. Berniece Salines. Sausage. Bologna and other processed lunch meats. Salami. Fatback. Hotdogs. Bratwurst. Salted nuts and seeds. Canned beans with added salt. Canned or smoked fish. Whole eggs or egg yolks. Chicken or [place] with skin. Dairy Whole or 2% milk, cream, and half-and-half. Whole or full-fat cream cheese. Whole-fat or sweetened yogurt. Full-fat cheese. Nondairy creamers. Whipped toppings. Processed cheese and cheese spreads. Fats and oils Butter. Stick margarine. Lard. Shortening. Ghee. Bacon fat. Tropical oils, such as coconut, palm kernel, or palm oil. Seasoning and other foods Salted popcorn and pretzels. Onion salt, garlic salt, seasoned salt, table salt, and sea salt. Worcestershire  sauce. Tartar sauce. Barbecue sauce. Teriyaki sauce. Soy sauce, including reduced-sodium. Steak sauce. Canned and packaged gravies. Fish sauce. Oyster sauce. Cocktail sauce. Horseradish that you find on the shelf. Ketchup. Mustard. Meat flavorings and tenderizers. Bouillon cubes. Hot sauce and Tabasco sauce. Premade or packaged marinades. Premade or packaged taco seasonings. Relishes. Regular salad dressings. Where to find more information:  National Heart, Lung, and [place]: https://wilson-eaton.com/  American Heart Association: www.heart.org Summary  The DASH eating plan is a healthy eating plan that has been shown to reduce high blood pressure (hypertension). It may also reduce your risk for type 2 diabetes, heart disease, and stroke.  With the DASH eating plan, you  should limit salt (sodium) intake to 2,300 mg a day. If you have hypertension, you may need to reduce your sodium intake to 1,500 mg a day.  When on the DASH eating plan, aim to eat more fresh fruits and vegetables, whole grains, lean proteins, low-fat dairy, and heart-healthy fats.  Work with your health care provider or diet and nutrition specialist (dietitian) to adjust your eating plan to your individual calorie needs. This information is not intended to replace advice given to you by your health care provider. Make sure you discuss any questions you have with your health care provider. Document Released: 06/17/2011 Document Revised: 06/21/2016 Document Reviewed: 06/21/2016 Elsevier Interactive Patient Education  2019 [employer].

## 2018-09-28 ENCOUNTER — Encounter: Payer: Self-pay | Admitting: Medical

## 2018-09-28 LAB — COMPREHENSIVE METABOLIC PANEL
A/G RATIO: 1.7 (ref 1.2–2.2)
ALK PHOS: 108 IU/L (ref 39–117)
ALT: 58 IU/L — ABNORMAL HIGH (ref 0–32)
AST: 59 IU/L — ABNORMAL HIGH (ref 0–40)
Albumin: 4.3 g/dL (ref 3.8–4.9)
BILIRUBIN TOTAL: 0.4 mg/dL (ref 0.0–1.2)
BUN/Creatinine Ratio: 20 (ref 9–23)
BUN: 15 mg/dL (ref 6–24)
CHLORIDE: 100 mmol/L (ref 96–106)
CO2: 23 mmol/L (ref 20–29)
Calcium: 9.7 mg/dL (ref 8.7–10.2)
Creatinine, Ser: 0.76 mg/dL (ref 0.57–1.00)
GFR calc Af Amer: 101 mL/min/{1.73_m2} (ref >59)
GFR calc non Af Amer: 88 mL/min/{1.73_m2} (ref >59)
Globulin, Total: 2.6 g/dL (ref 1.5–4.5)
Glucose: 101 mg/dL — ABNORMAL HIGH (ref 65–99)
POTASSIUM: 4.3 mmol/L (ref 3.5–5.2)
Sodium: 140 mmol/L (ref 134–144)
Total Protein: 6.9 g/dL (ref 6.0–8.5)

## 2018-09-28 LAB — CBC WITH DIFFERENTIAL/PLATELET
Basophils Absolute: 0.1 10*3/uL (ref 0.0–0.2)
Basos: 1 %
EOS (ABSOLUTE): 0.1 10*3/uL (ref 0.0–0.4)
Eos: 2 %
Hematocrit: 41.5 % (ref 34.0–46.6)
Hemoglobin: 14.2 g/dL (ref 11.1–15.9)
Immature Grans (Abs): 0 10*3/uL (ref 0.0–0.1)
Immature Granulocytes: 0 %
Lymphocytes Absolute: 2.2 10*3/uL (ref 0.7–3.1)
Lymphs: 41 %
MCH: 30.1 pg (ref 26.6–33.0)
MCHC: 34.2 g/dL (ref 31.5–35.7)
MCV: 88 fL (ref 79–97)
Monocytes Absolute: 0.5 10*3/uL (ref 0.1–0.9)
Monocytes: 10 %
NEUTROS ABS: 2.5 10*3/uL (ref 1.4–7.0)
Neutrophils: 46 %
Platelets: 183 10*3/uL (ref 150–450)
RBC: 4.72 x10E6/uL (ref 3.77–5.28)
RDW: 12.6 % (ref 11.7–15.4)
WBC: 5.4 10*3/uL (ref 3.4–10.8)

## 2018-09-28 NOTE — Progress Notes (Signed)
FYI , patient seen in clinic for swelling in lower legs and feet and swelling in hands. Also complained of joint pain. If you desire further blood tests [facility] has her blood x 6 days.

## 2018-09-28 NOTE — Progress Notes (Signed)
Will do . Sincerely, Teodor Prater Ratclifffe PA-C

## 2018-10-02 ENCOUNTER — Ambulatory Visit: Payer: BLUE CROSS/BLUE SHIELD | Admitting: Primary Care

## 2018-11-10 ENCOUNTER — Ambulatory Visit: Payer: BLUE CROSS/BLUE SHIELD | Admitting: Obstetrics & Gynecology

## 2018-11-14 ENCOUNTER — Other Ambulatory Visit (INDEPENDENT_AMBULATORY_CARE_PROVIDER_SITE_OTHER): Payer: BLUE CROSS/BLUE SHIELD

## 2018-11-14 DIAGNOSIS — E119 Type 2 diabetes mellitus without complications: Secondary | ICD-10-CM

## 2018-11-14 LAB — POCT GLYCOSYLATED HEMOGLOBIN (HGB A1C): Hemoglobin A1C: 6.4 % — AB (ref 4.0–5.6)

## 2018-12-07 DIAGNOSIS — M189 Osteoarthritis of first carpometacarpal joint, unspecified: Secondary | ICD-10-CM | POA: Diagnosis not present

## 2018-12-18 ENCOUNTER — Other Ambulatory Visit: Payer: Self-pay

## 2018-12-18 ENCOUNTER — Other Ambulatory Visit: Payer: Self-pay | Admitting: Primary Care

## 2018-12-18 DIAGNOSIS — F411 Generalized anxiety disorder: Secondary | ICD-10-CM

## 2018-12-18 DIAGNOSIS — Z1231 Encounter for screening mammogram for malignant neoplasm of breast: Secondary | ICD-10-CM

## 2018-12-18 DIAGNOSIS — E781 Pure hyperglyceridemia: Secondary | ICD-10-CM

## 2018-12-18 DIAGNOSIS — E119 Type 2 diabetes mellitus without complications: Secondary | ICD-10-CM

## 2018-12-18 MED ORDER — ATORVASTATIN CALCIUM 10 MG PO TABS: 10.0000 mg | ORAL_TABLET | Freq: Every day | ORAL | 1 refills | Status: DC

## 2018-12-18 MED ORDER — CITALOPRAM HYDROBROMIDE 10 MG PO TABS: 10.0000 mg | ORAL_TABLET | Freq: Every day | ORAL | 1 refills | Status: DC

## 2018-12-18 MED ORDER — METFORMIN HCL ER 500 MG PO TB24: 1000.0000 mg | ORAL_TABLET | Freq: Every day | ORAL | 1 refills | Status: DC

## 2018-12-21 DIAGNOSIS — Z86010 Personal history of colonic polyps: Secondary | ICD-10-CM | POA: Diagnosis not present

## 2018-12-21 DIAGNOSIS — R7989 Other specified abnormal findings of blood chemistry: Secondary | ICD-10-CM | POA: Diagnosis not present

## 2018-12-28 ENCOUNTER — Telehealth: Payer: Self-pay | Admitting: *Deleted

## 2018-12-28 ENCOUNTER — Other Ambulatory Visit: Payer: Self-pay

## 2018-12-28 DIAGNOSIS — Z20822 Contact with and (suspected) exposure to covid-19: Secondary | ICD-10-CM

## 2018-12-28 NOTE — Addendum Note (Signed)
Addended by: Torrie Mayers on: 12/28/2018 08:52 AM   Modules accepted: Orders

## 2018-12-28 NOTE — Telephone Encounter (Signed)
LM on VM for patient to call to schedule covid testing. [phone] M-F 7a-7p. Order placed

## 2018-12-28 NOTE — Telephone Encounter (Signed)
-----   Message from Pleas Koch, NP sent at 12/28/2018  7:59 AM EDT ----- Regarding: Covid Testing Good morning!  This patient has been exposed to a likely positive Covid case. She has no symptoms. Please evaluate.   Thanks! Allie Bossier, NP-C

## 2018-12-28 NOTE — Telephone Encounter (Signed)
Pt scheduled for covid-19 testing today at 9:45 @ [facility]. Instructions given and order placed

## 2018-12-30 LAB — NOVEL CORONAVIRUS, NAA: SARS-CoV-2, NAA: NOT DETECTED

## 2019-01-02 ENCOUNTER — Telehealth: Payer: Self-pay | Admitting: *Deleted

## 2019-01-02 ENCOUNTER — Other Ambulatory Visit: Payer: BC Managed Care – PPO

## 2019-01-02 DIAGNOSIS — Z20822 Contact with and (suspected) exposure to covid-19: Secondary | ICD-10-CM

## 2019-01-02 DIAGNOSIS — R6889 Other general symptoms and signs: Secondary | ICD-10-CM | POA: Diagnosis not present

## 2019-01-02 NOTE — Telephone Encounter (Signed)
Noted, appreciate it.

## 2019-01-02 NOTE — Telephone Encounter (Addendum)
Contacted pt to schedule testing; pt accepted appointment at [facility] 01/02/2019 at [place]; pt given address, location, and instructions that she and all occupants of her vehicle should wear masks; she verbalized understanding; orders placed per protocol.  ----- Message from Pleas Koch, NP sent at 01/01/2019  7:59 PM EDT ----- Regarding: Covid Testing Good evening!  This patient was exposed to Covid on June 15th, tested negative on June 18th. Since then she's developed a cough with body aches, she was asymptomatic during initial testing. Health department is recommending retesting.   Please consider. Thanks! Allie Bossier, NP-C

## 2019-01-05 LAB — NOVEL CORONAVIRUS, NAA: SARS-CoV-2, NAA: NOT DETECTED

## 2019-01-08 ENCOUNTER — Other Ambulatory Visit: Payer: Self-pay | Admitting: Primary Care

## 2019-01-08 DIAGNOSIS — K219 Gastro-esophageal reflux disease without esophagitis: Secondary | ICD-10-CM

## 2019-01-16 ENCOUNTER — Other Ambulatory Visit: Payer: Self-pay

## 2019-01-17 NOTE — Progress Notes (Signed)
58 y.o. G3P2 Married White or Caucasian female here for annual exam.  Having a big family vacation in two weeks with husband's family.  They are all being careful.    Denies vaginal bleeding.    Having stressors and her mother is having significant memory issues.  She is moving into an appt with an independent living facility.  They have an appt with mother's PCP next week.  Will likely have a neurologist appt.  Has been diagnosed with type 2 diabetes.  Knows weight gain is contributing.    PCP:  Alma Friendly, NP  No LMP recorded. Patient is postmenopausal.          Sexually active: Yes.    The current method of family planning is post menopausal status.    Exercising: No.  The patient does not participate in regular exercise at present. Smoker:  no  Health Maintenance: Pap:  07-26-16 negative, HR HPV negative History of abnormal Pap:  yes MMG:  11-04-17 density B/BIRADS 1 negative, scheduled on 02-07-2019 Colonoscopy:  02-15-14 diverticulosis, f/u 5 years, scheduled 03-09-2019 BMD:   none TDaP:  02-10-16 Pneumonia vaccine(s):  08-16-2018 Shingrix:   no Hep C testing: 02-05-16 negative Screening Labs: PCP   reports that she has never smoked. She has never used smokeless tobacco. She reports current alcohol use of about 3.0 standard drinks of alcohol per week. She reports that she does not use drugs.  Past Medical History:  Diagnosis Date  . Diabetes mellitus without complication ([place])    followed by PCP  . Diverticulosis    noted on colonoscopy  . Essential hypertension   . GERD (gastroesophageal reflux disease)   . Hemorrhoids   . Insomnia     Past Surgical History:  Procedure Laterality Date  . APPENDECTOMY  2009  . CERVICAL FUSION  2010  . CESAREAN SECTION    . CHOLECYSTECTOMY  2000  . ENDOMETRIAL ABLATION    . GYNECOLOGIC CRYOSURGERY    . RETROPUBIC SLING  10/2009   Dr. Davis Gourd at [facility]  . REVISION URINARY SLING  11/19/2009   with cysto due to urinary  retension    Current Outpatient Medications  Medication Sig Dispense Refill  . atorvastatin (LIPITOR) 10 MG tablet Take 1 tablet (10 mg total) by mouth daily. For cholesterol. 90 tablet 1  . citalopram (CELEXA) 10 MG tablet Take 1 tablet (10 mg total) by mouth daily. For anxiety. 90 tablet 1  . estradiol (ESTRACE VAGINAL) 0.1 MG/GM vaginal cream Place 1 Applicatorful vaginally 3 (three) times a week. 42.5 g 0  . lisinopril (PRINIVIL,ZESTRIL) 20 MG tablet Take 1 tablet (20 mg total) by mouth daily. For blood pressure. 90 tablet 0  . metFORMIN (GLUCOPHAGE-XR) 500 MG 24 hr tablet Take 2 tablets (1,000 mg total) by mouth daily with breakfast. For diabetes. 180 tablet 1  . omeprazole (PRILOSEC) 20 MG capsule TAKE 1 CAPSULE EVERY DAY 90 capsule 1   No current facility-administered medications for this visit.     Family History  Problem Relation Age of Onset  . Arthritis Mother   . Diabetes Mother   . Colon cancer Mother 1  . Lung cancer Father   . Hypertension Father   . Heart attack Father   . Breast cancer Neg Hx     Review of Systems  All other systems reviewed and are negative.   Exam:   BP (!) 152/86 (BP Location: Left Arm, Patient Position: Sitting, Cuff Size: Normal)   Pulse 92  Temp (!) 97.5 F (36.4 C) (Temporal)   Resp 14   Ht 5' 5.25" (1.657 m)   Wt 195 lb 6.4 oz (88.6 kg)   BMI 32.27 kg/m    Height: 5' 5.25" (165.7 cm)  Ht Readings from Last 3 Encounters:  01/18/19 5' 5.25" (1.657 m)  08/16/18 5\' 5"  (1.651 m)  05/16/18 5\' 5"  (1.651 m)    General appearance: alert, cooperative and appears stated age Head: Normocephalic, without obvious abnormality, atraumatic Neck: no adenopathy, supple, symmetrical, trachea midline and thyroid normal to inspection and palpation Lungs: clear to auscultation bilaterally Breasts: normal appearance, no masses or tenderness Heart: regular rate and rhythm Abdomen: soft, non-tender; bowel sounds normal; no masses,  no  organomegaly Extremities: extremities normal, atraumatic, no cyanosis or edema Skin: Skin color, texture, turgor normal. No rashes or lesions Lymph nodes: Cervical, supraclavicular, and axillary nodes normal. No abnormal inguinal nodes palpated Neurologic: Grossly normal   Pelvic: External genitalia:  no lesions, new pink lesion on left labia majora (pt desires removal)              Urethra:  normal appearing urethra with no masses, tenderness or lesions              Bartholins and Skenes: normal                 Vagina: normal appearing vagina with normal color and discharge, no lesions              Cervix: no lesions              Pap taken: Yes.   Bimanual Exam:  Uterus:  normal size, contour, position, consistency, mobility, non-tender              Adnexa: normal adnexa and no mass, fullness, tenderness               Rectovaginal: Confirms               Anus:  normal sphincter tone, no lesions  Procedure:  Area cleansed with Betadine.  Sterile technique used throughout procedure.  Skin anesthestized with Lidocaine 1% plain; 76mL.  Lot:  [id number].  Exp:  01/10/2020.  Lesions removed with sterile scissors and pick-ups.  Adequate hemostasis obtained with silver nitrate sticks.  Dressing was not applied.    Chaperone was present for exam.  A:  Well Woman with normal exam Family hx of CVD (brother with MI at age 49) DM Elevated lipids Hypertension Mildly elevated liver enzymes (with full evaluation including liver biopsy) Vulvar lesions removed today  P:   Mammogram guidelines reviewed pap smear obtained today Vit D obtained today.  Other blood work has been completed earlier this year Referral to cardiology due to family hx, DM, hypertension and hyperlipidemia  Has colonoscopy scheduled Plan BMD around age 11 Shinrix vaccination discussed Vulvar pathology pending.  Results will be called to pt Information about Weight Management program reviewed return annually or prn

## 2019-01-18 ENCOUNTER — Other Ambulatory Visit (HOSPITAL_COMMUNITY)
Admission: RE | Admit: 2019-01-18 | Discharge: 2019-01-18 | Disposition: A | Discharge status: Home / Self Care | Payer: BC Managed Care – PPO | Source: Ambulatory Visit | Attending: Obstetrics & Gynecology | Admitting: Obstetrics & Gynecology

## 2019-01-18 ENCOUNTER — Encounter: Payer: Self-pay | Admitting: Obstetrics & Gynecology

## 2019-01-18 ENCOUNTER — Other Ambulatory Visit: Payer: Self-pay

## 2019-01-18 ENCOUNTER — Ambulatory Visit: Payer: BC Managed Care – PPO | Admitting: Obstetrics & Gynecology

## 2019-01-18 VITALS — BP 152/86 | HR 92 | Temp 97.5°F | Resp 14 | Ht 65.25 in | Wt 195.4 lb

## 2019-01-18 DIAGNOSIS — Z8249 Family history of ischemic heart disease and other diseases of the circulatory system: Secondary | ICD-10-CM | POA: Diagnosis not present

## 2019-01-18 DIAGNOSIS — Z124 Encounter for screening for malignant neoplasm of cervix: Secondary | ICD-10-CM

## 2019-01-18 DIAGNOSIS — E559 Vitamin D deficiency, unspecified: Secondary | ICD-10-CM

## 2019-01-18 DIAGNOSIS — N9089 Other specified noninflammatory disorders of vulva and perineum: Secondary | ICD-10-CM

## 2019-01-18 DIAGNOSIS — Z01419 Encounter for gynecological examination (general) (routine) without abnormal findings: Secondary | ICD-10-CM

## 2019-01-19 LAB — CYTOLOGY - PAP
Adequacy: ABSENT
Diagnosis: NEGATIVE

## 2019-01-19 LAB — VITAMIN D 25 HYDROXY (VIT D DEFICIENCY, FRACTURES): Vit D, 25-Hydroxy: 22.5 ng/mL — ABNORMAL LOW (ref 30.0–100.0)

## 2019-01-22 ENCOUNTER — Other Ambulatory Visit: Payer: Self-pay | Admitting: *Deleted

## 2019-01-22 DIAGNOSIS — E559 Vitamin D deficiency, unspecified: Secondary | ICD-10-CM

## 2019-01-22 MED ORDER — VITAMIN D (ERGOCALCIFEROL) 1.25 MG (50000 UNIT) PO CAPS: 50000.0000 [IU] | ORAL_CAPSULE | ORAL | 0 refills | Status: DC

## 2019-01-22 NOTE — Addendum Note (Signed)
Addended by: Polly Cobia on: 01/22/2019 05:14 PM   Modules accepted: Orders

## 2019-01-22 NOTE — Addendum Note (Signed)
Addended by: Megan Salon on: 01/22/2019 12:47 PM   Modules accepted: Orders

## 2019-02-03 ENCOUNTER — Encounter: Payer: Self-pay | Admitting: Obstetrics & Gynecology

## 2019-02-05 ENCOUNTER — Telehealth: Payer: Self-pay | Admitting: Obstetrics & Gynecology

## 2019-02-05 NOTE — Telephone Encounter (Signed)
Patient sent the following correspondence through [place]. Routing to triage to assist patient with request.  Thank you. Also, I have not received a call about scheduling the Cardio Calcium test. Were they supposed to contact me?    [place]

## 2019-02-06 NOTE — Telephone Encounter (Signed)
Routing to [place] to f/u on referral to cardiology placed on 01/18/19.

## 2019-02-12 ENCOUNTER — Ambulatory Visit: Payer: BC Managed Care – PPO | Admitting: Cardiovascular Disease

## 2019-02-21 DIAGNOSIS — Z01812 Encounter for preprocedural laboratory examination: Secondary | ICD-10-CM | POA: Diagnosis not present

## 2019-02-28 DIAGNOSIS — K635 Polyp of colon: Secondary | ICD-10-CM | POA: Diagnosis not present

## 2019-02-28 DIAGNOSIS — K5730 Diverticulosis of large intestine without perforation or abscess without bleeding: Secondary | ICD-10-CM | POA: Diagnosis not present

## 2019-02-28 DIAGNOSIS — D126 Benign neoplasm of colon, unspecified: Secondary | ICD-10-CM | POA: Diagnosis not present

## 2019-02-28 DIAGNOSIS — Z86010 Personal history of colonic polyps: Secondary | ICD-10-CM | POA: Diagnosis not present

## 2019-02-28 DIAGNOSIS — D122 Benign neoplasm of ascending colon: Secondary | ICD-10-CM | POA: Diagnosis not present

## 2019-02-28 DIAGNOSIS — Z800 Family history of malignant neoplasm of digestive organs: Secondary | ICD-10-CM | POA: Diagnosis not present

## 2019-02-28 DIAGNOSIS — D120 Benign neoplasm of cecum: Secondary | ICD-10-CM | POA: Diagnosis not present

## 2019-02-28 DIAGNOSIS — Z1211 Encounter for screening for malignant neoplasm of colon: Secondary | ICD-10-CM | POA: Diagnosis not present

## 2019-02-28 DIAGNOSIS — D124 Benign neoplasm of descending colon: Secondary | ICD-10-CM | POA: Diagnosis not present

## 2019-02-28 LAB — HM COLONOSCOPY

## 2019-03-06 ENCOUNTER — Encounter: Payer: Self-pay | Admitting: Medical

## 2019-03-06 ENCOUNTER — Other Ambulatory Visit: Payer: Self-pay

## 2019-03-06 ENCOUNTER — Ambulatory Visit: Payer: BC Managed Care – PPO | Admitting: Medical

## 2019-03-06 ENCOUNTER — Ambulatory Visit: Payer: BC Managed Care – PPO | Admitting: Primary Care

## 2019-03-06 VITALS — BP 141/90 | HR 90 | Temp 97.7°F | Resp 16 | Wt 195.6 lb

## 2019-03-06 DIAGNOSIS — N3001 Acute cystitis with hematuria: Secondary | ICD-10-CM

## 2019-03-06 DIAGNOSIS — R300 Dysuria: Secondary | ICD-10-CM

## 2019-03-06 LAB — POCT URINALYSIS DIPSTICK
Bilirubin, UA: NEGATIVE
Glucose, UA: NEGATIVE
Ketones, UA: NEGATIVE
Nitrite, UA: NEGATIVE
Protein, UA: POSITIVE — AB
Spec Grav, UA: 1.025 (ref 1.010–1.025)
Urobilinogen, UA: 0.2 E.U./dL
pH, UA: 6 (ref 5.0–8.0)

## 2019-03-06 MED ORDER — CIPROFLOXACIN HCL 500 MG PO TABS: 500.0000 mg | ORAL_TABLET | Freq: Two times a day (BID) | ORAL | 0 refills | Status: DC

## 2019-03-06 NOTE — Progress Notes (Signed)
Subjective:    Patient ID: Michelle Figueroa, female    DOB: 03/09/1961, 58 y.o.   MRN: [id number]  HPI 58 yo female in non acute distress.  Called and says she had burning and itching and discharge, and pain. Recommended patient to see her primary or her ob/gyn since this clinic does not do female GU exams.  Patient called back  30 min later and says it is is more like a UTI and would like to come into the clinic to be checked.  On visit patient states symptoms started yesterday. She complains of burning , frequency, and pressure and pain.   Blood pressure (!) 141/90, pulse 90, temperature 97.7 F (36.5 C), temperature source Tympanic, resp. rate 16, weight 195 lb 9.6 oz (88.7 kg), SpO2 100 %.   Review of Systems  Constitutional: Negative for chills and fever.  Gastrointestinal: Negative for abdominal distention.  Genitourinary: Positive for decreased urine volume (at times), difficulty urinating, dysuria, frequency and urgency. Negative for hematuria.       Pressure supra pubuic per patient  Musculoskeletal: Negative for back pain.       Objective:   Physical Exam Vitals signs and nursing note reviewed.  Constitutional:      Appearance: Normal appearance. She is obese.  HENT:     Head: Normocephalic and atraumatic.  Neck:     Musculoskeletal: Normal range of motion.  Abdominal:     General: Abdomen is flat. Bowel sounds are normal. There is no distension.     Palpations: Abdomen is soft. There is no mass.     Tenderness: There is no abdominal tenderness. There is no right CVA tenderness, left CVA tenderness, guarding or rebound.     Hernia: No hernia is present.  Musculoskeletal: Normal range of motion.  Skin:    General: Skin is warm and dry.  Neurological:     General: No focal deficit present.     Mental Status: She is alert and oriented to person, place, and time.  Psychiatric:        Mood and Affect: Mood normal.        Behavior: Behavior normal.        Thought  Content: Thought content normal.        Judgment: Judgment normal.    Recent Results (from the past 2160 hour(s))  Novel Coronavirus, NAA ([facility])     Status: None   Collection Time: 12/28/18  9:02 AM  Result Value Ref Range   SARS-CoV-2, NAA Not Detected Not Detected    Comment: Testing was performed using the cobas(R) SARS-CoV-2 test. This test was developed and its performance characteristics determined by [employer]. This test has not been FDA cleared or approved. This test has been authorized by FDA under an Emergency Use Authorization (EUA). This test is only authorized for the duration of time the declaration that circumstances exist justifying the authorization of the emergency use of in vitro diagnostic tests for detection of SARS-CoV-2 virus and/or diagnosis of COVID-19 infection under section 564(b)(1) of the Act, 21 U.S.C. [id number]), unless the authorization is terminated or revoked sooner. When diagnostic testing is negative, the possibility of a false negative result should be considered in the context of a patient's recent exposures and the presence of clinical signs and symptoms consistent with COVID-19. An individual without symptoms of COVID-19 and who is not shedding SARS-CoV-2 virus would expect to have a negati ve (not detected) result in this assay.   Novel Coronavirus, NAA (  [facility])     Status: None   Collection Time: 01/02/19  9:03 AM  Result Value Ref Range   SARS-CoV-2, NAA Not Detected Not Detected    Comment: Testing was performed using the cobas(R) SARS-CoV-2 test. This test was developed and its performance characteristics determined by [employer]. This test has not been FDA cleared or approved. This test has been authorized by FDA under an Emergency Use Authorization (EUA). This test is only authorized for the duration of time the declaration that circumstances exist justifying the authorization of the emergency use of in  vitro diagnostic tests for detection of SARS-CoV-2 virus and/or diagnosis of COVID-19 infection under section 564(b)(1) of the Act, 21 U.S.C. [id number]), unless the authorization is terminated or revoked sooner. When diagnostic testing is negative, the possibility of a false negative result should be considered in the context of a patient's recent exposures and the presence of clinical signs and symptoms consistent with COVID-19. An individual without symptoms of COVID-19 and who is not shedding SARS-CoV-2 virus would expect to have a negati ve (not detected) result in this assay.   Cytology - PAP( [place])     Status: None   Collection Time: 01/18/19 12:00 AM  Result Value Ref Range   Adequacy      Satisfactory for evaluation  endocervical/transformation zone component ABSENT.   Diagnosis      NEGATIVE FOR INTRAEPITHELIAL LESIONS OR MALIGNANCY.   Material Submitted CervicoVaginal Pap [ThinPrep Imaged]    CYTOLOGY - PAP PAP RESULT   VITAMIN D 25 Hydroxy (Vit-D Deficiency, Fractures)     Status: Abnormal   Collection Time: 01/18/19 10:25 AM  Result Value Ref Range   Vit D, 25-Hydroxy 22.5 (L) 30.0 - 100.0 ng/mL    Comment: Vitamin D deficiency has been defined by the [place] practice guideline as a level of serum 25-OH vitamin D less than 20 ng/mL (1,2). The Endocrine Society went on to further define vitamin D insufficiency as a level between 21 and 29 ng/mL (2). 1. IOM (Institute of Medicine). 2010. Dietary reference    intakes for calcium and D. [place]: The    [employer]. 2. Holick MF, Binkley [place], Bischoff-Ferrari HA, et al.    Evaluation, treatment, and prevention of vitamin D    deficiency: an Endocrine Society clinical practice    guideline. JCEM. 2011 Jul; 96(7):1911-30.   POCT urinalysis dipstick     Status: Abnormal   Collection Time: 03/06/19 11:10 AM  Result Value Ref Range   Color, UA amber     Clarity, UA     Glucose, UA Negative Negative   Bilirubin, UA neg    Ketones, UA neg    Spec Grav, UA 1.025 1.010 - 1.025   Blood, UA trace    pH, UA 6.0 5.0 - 8.0   Protein, UA Positive (A) Negative    Comment: trace   Urobilinogen, UA 0.2 0.2 or 1.0 E.U./dL   Nitrite, UA neg    Leukocytes, UA Moderate (2+) (A) Negative   Appearance     Odor         Assessment & Plan:  Urinary tract infection  Dysuria Reviewed hygiene with patient. Meds ordered this encounter  Medications  . ciprofloxacin (CIPRO) 500 MG tablet    Sig: Take 1 tablet (500 mg total) by mouth 2 (two) times daily.    Dispense:  14 tablet    Refill:  0  Given AZO 4  tablerts to take 2 every 8 hours x  2 d only. Drink plenty of water. If no improvement in 2-3 days call the clinic. Patient verbalizes understanding and has no questions at discharge.

## 2019-03-09 ENCOUNTER — Telehealth: Payer: BC Managed Care – PPO | Admitting: Registered Nurse

## 2019-03-09 ENCOUNTER — Other Ambulatory Visit: Payer: Self-pay

## 2019-03-09 ENCOUNTER — Encounter: Payer: Self-pay | Admitting: Nurse Practitioner

## 2019-03-09 VITALS — HR 60 | Temp 100.0°F | Resp 16 | Ht 66.0 in | Wt 189.0 lb

## 2019-03-09 DIAGNOSIS — N3000 Acute cystitis without hematuria: Secondary | ICD-10-CM

## 2019-03-09 LAB — POCT URINALYSIS DIPSTICK
Appearance: NORMAL
Bilirubin, UA: NEGATIVE
Blood, UA: NEGATIVE
Glucose, UA: NEGATIVE
Ketones, UA: NEGATIVE
Nitrite, UA: NEGATIVE
Protein, UA: NEGATIVE
Spec Grav, UA: 1.025 (ref 1.010–1.025)
Urobilinogen, UA: 1 E.U./dL
pH, UA: 7 (ref 5.0–8.0)

## 2019-03-09 MED ORDER — NITROFURANTOIN MONOHYD MACRO 100 MG PO CAPS: 100.0000 mg | ORAL_CAPSULE | Freq: Two times a day (BID) | ORAL | 0 refills | Status: DC

## 2019-03-09 NOTE — Patient Instructions (Signed)
Push water to keep urine pale clear yellow May take motrin 800mg  by mouth every 8 hours with food for fever/pain May alternate with tylenol 1000mg  by mouth every 6 hours Seek same day re-evaluation if unable to pee every 8 hours, cola/tea colored urine, repeated vomiting, swelling in hands or feet, worsening back/abdomen/genital pain Symptoms should improve with 48 hours of macrobid; urine culture takes 48-72 hours typically to get results Please give urine sample at [facility] clinic today--walk in before 2pm  Acute Back Pain, Adult Acute back pain is sudden and usually short-lived. It is often caused by an injury to the muscles and tissues in the back. The injury may result from:  A muscle or ligament getting overstretched or torn (strained). Ligaments are tissues that connect bones to each other. Lifting something improperly can cause a back strain.  Wear and tear (degeneration) of the spinal disks. Spinal disks are circular tissue that provides cushioning between the bones of the spine (vertebrae).  Twisting motions, such as while playing sports or doing yard work.  A hit to the back.  Arthritis. You may have a physical exam, lab tests, and imaging tests to find the cause of your pain. Acute back pain usually goes away with rest and home care. Follow these instructions at home: Managing pain, stiffness, and swelling  Take over-the-counter and prescription medicines only as told by your health care provider.  Your health care provider may recommend applying ice during the first 24-48 hours after your pain starts. To do this: ? Put ice in a plastic bag. ? Place a towel between your skin and the bag. ? Leave the ice on for 20 minutes, 2-3 times a day.  If directed, apply heat to the affected area as often as told by your health care provider. Use the heat source that your health care provider recommends, such as a moist heat pack or a heating pad. ? Place a towel between your skin and the  heat source. ? Leave the heat on for 20-30 minutes. ? Remove the heat if your skin turns bright red. This is especially important if you are unable to feel pain, heat, or cold. You have a greater risk of getting burned. Activity   Do not stay in bed. Staying in bed for more than 1-2 days can delay your recovery.  Sit up and stand up straight. Avoid leaning forward when you sit, or hunching over when you stand. ? If you work at a desk, sit close to it so you do not need to lean over. Keep your chin tucked in. Keep your neck drawn back, and keep your elbows bent at a right angle. Your arms should look like the letter "L." ? Sit high and close to the steering wheel when you drive. Add lower back (lumbar) support to your car seat, if needed.  Take short walks on even surfaces as soon as you are able. Try to increase the length of time you walk each day.  Do not sit, drive, or stand in one place for more than 30 minutes at a time. Sitting or standing for long periods of time can put stress on your back.  Do not drive or use heavy machinery while taking prescription pain medicine.  Use proper lifting techniques. When you bend and lift, use positions that put less stress on your back: ? [place] your knees. ? Keep the load close to your body. ? Avoid twisting.  Exercise regularly as told by your health care  provider. Exercising helps your back heal faster and helps prevent back injuries by keeping muscles strong and flexible.  Work with a physical therapist to make a safe exercise program, as recommended by your health care provider. Do any exercises as told by your physical therapist. Lifestyle  Maintain a healthy weight. Extra weight puts stress on your back and makes it difficult to have good posture.  Avoid activities or situations that make you feel anxious or stressed. Stress and anxiety increase muscle tension and can make back pain worse. Learn ways to manage anxiety and stress, such as  through exercise. General instructions  Sleep on a firm mattress in a comfortable position. Try lying on your side with your knees slightly bent. If you lie on your back, put a pillow under your knees.  Follow your treatment plan as told by your health care provider. This may include: ? Cognitive or behavioral therapy. ? Acupuncture or massage therapy. ? Meditation or yoga. Contact a health care provider if:  You have pain that is not relieved with rest or medicine.  You have increasing pain going down into your legs or buttocks.  Your pain does not improve after 2 weeks.  You have pain at night.  You lose weight without trying.  You have a fever or chills. Get help right away if:  You develop new bowel or bladder control problems.  You have unusual weakness or numbness in your arms or legs.  You develop nausea or vomiting.  You develop abdominal pain.  You feel faint. Summary  Acute back pain is sudden and usually short-lived.  Use proper lifting techniques. When you bend and lift, use positions that put less stress on your back.  Take over-the-counter and prescription medicines and apply heat or ice as directed by your health care provider. This information is not intended to replace advice given to you by your health care provider. Make sure you discuss any questions you have with your health care provider. Document Released: 06/28/2005 Document Revised: 10/17/2018 Document Reviewed: 02/09/2017 Elsevier Patient Education  2020 [place]. Kidney Stones  Kidney stones (urolithiasis) are solid, rock-like deposits that form inside of the organs that make urine (kidneys). A kidney stone may form in a kidney and move into the bladder, where it can cause intense pain and block the flow of urine. Kidney stones are created when high levels of certain minerals are found in the urine. They are usually passed through urination, but in some cases, medical treatment may be  needed to remove them. What are the causes? Kidney stones may be caused by:  A condition in which certain glands produce too much parathyroid hormone (primary hyperparathyroidism), which causes too much calcium buildup in the blood.  Buildup of uric acid crystals in the bladder (hyperuricosuria). Uric acid is a chemical that the body produces when you eat certain foods. It usually exits the body in the urine.  Narrowing (stricture) of one or both of the tubes that drain urine from the kidneys to the bladder (ureters).  A kidney blockage that is present at birth (congenital obstruction).  Past surgery on the kidney or the ureters, such as gastric bypass surgery. What increases the risk? The following factors make you more likely to develop kidney stones:  Having had a kidney stone in the past.  Having a family history of kidney stones.  Not drinking enough water.  Eating a diet that is high in protein, salt (sodium), or sugar.  Being overweight or  obese. What are the signs or symptoms? Symptoms of a kidney stone may include:  Nausea.  Vomiting.  Blood in the urine (hematuria).  Pain in the side of the abdomen, right below the ribs (flank pain). Pain usually spreads (radiates) to the groin.  Needing to urinate frequently or urgently. How is this diagnosed? This condition may be diagnosed based on:  Your medical history.  A physical exam.  Blood tests.  Urine tests.  CT scan.  Abdominal X-ray.  A procedure to examine the inside of the bladder (cystoscopy). How is this treated? Treatment for kidney stones depends on the size, location, and makeup of the stones. Treatment may involve:  Analyzing your urine before and after you pass the stone through urination.  Being monitored at the hospital until you pass the stone through urination.  Increasing your fluid intake and decreasing the amount of calcium and protein in your diet.  A procedure to break up kidney  stones in the bladder using: ? A focused beam of light (laser therapy). ? Shock waves (extracorporeal shock wave lithotripsy).  Surgery to remove kidney stones. This may be needed if you have severe pain or have stones that block your urinary tract. Follow these instructions at home: Eating and drinking  Drink enough fluid to keep your urine clear or pale yellow. This will help you to pass the kidney stone.  If directed, change your diet. This may include: ? Limiting how much sodium you eat. ? Eating more fruits and vegetables. ? Limiting how much meat, poultry, fish, and eggs you eat.  Follow instructions from your health care provider about eating or drinking restrictions. General instructions  Collect urine samples as told by your health care provider. You may need to collect a urine sample: ? 24 hours after you pass the stone. ? 8-12 weeks after passing the kidney stone, and every 6-12 months after that.  Strain your urine every time you urinate, for as long as directed. Use the strainer that your health care provider recommends.  Do not throw out the kidney stone after passing it. Keep the stone so it can be tested by your health care provider. Testing the makeup of your kidney stone may help prevent you from getting kidney stones in the future.  Take over-the-counter and prescription medicines only as told by your health care provider.  Keep all follow-up visits as told by your health care provider. This is important. You may need follow-up X-rays or ultrasounds to make sure that your stone has passed. How is this prevented? To prevent another kidney stone:  Drink enough fluid to keep your urine clear or pale yellow. This is the best way to prevent kidney stones.  Eat a healthy diet and follow recommendations from your health care provider about foods to avoid. You may be instructed to eat a low-protein diet. Recommendations vary depending on the type of kidney stone that you  have.  Maintain a healthy weight. Contact a health care provider if:  You have pain that gets worse or does not get better with medicine. Get help right away if:  You have a fever or chills.  You develop severe pain.  You develop new abdominal pain.  You faint.  You are unable to urinate. This information is not intended to replace advice given to you by your health care provider. Make sure you discuss any questions you have with your health care provider. Document Released: 06/28/2005 Document Revised: 02/07/2018 Document Reviewed: 12/12/2015 Elsevier Patient  Education  [employer]. Urinary Tract Infection, Adult  A urinary tract infection (UTI) is an infection of any part of the urinary tract. The urinary tract includes the kidneys, ureters, bladder, and urethra. These organs make, store, and get rid of urine in the body. Your health care provider may use other names to describe the infection. An upper UTI affects the ureters and kidneys (pyelonephritis). A lower UTI affects the bladder (cystitis) and urethra (urethritis). What are the causes? Most urinary tract infections are caused by bacteria in your genital area, around the entrance to your urinary tract (urethra). These bacteria grow and cause inflammation of your urinary tract. What increases the risk? You are more likely to develop this condition if:  You have a urinary catheter that stays in place (indwelling).  You are not able to control when you urinate or have a bowel movement (you have incontinence).  You are female and you: ? Use a spermicide or diaphragm for birth control. ? Have low estrogen levels. ? Are pregnant.  You have certain genes that increase your risk (genetics).  You are sexually active.  You take antibiotic medicines.  You have a condition that causes your flow of urine to slow down, such as: ? An enlarged prostate, if you are female. ? Blockage in your urethra (stricture). ? A kidney  stone. ? A nerve condition that affects your bladder control (neurogenic bladder). ? Not getting enough to drink, or not urinating often.  You have certain medical conditions, such as: ? Diabetes. ? A weak disease-fighting system (immunesystem). ? Sickle cell disease. ? Gout. ? Spinal cord injury. What are the signs or symptoms? Symptoms of this condition include:  Needing to urinate right away (urgently).  Frequent urination or passing small amounts of urine frequently.  Pain or burning with urination.  Blood in the urine.  Urine that smells bad or unusual.  Trouble urinating.  Cloudy urine.  Vaginal discharge, if you are female.  Pain in the abdomen or the lower back. You may also have:  Vomiting or a decreased appetite.  Confusion.  Irritability or tiredness.  A fever.  Diarrhea. The first symptom in older adults may be confusion. In some cases, they may not have any symptoms until the infection has worsened. How is this diagnosed? This condition is diagnosed based on your medical history and a physical exam. You may also have other tests, including:  Urine tests.  Blood tests.  Tests for sexually transmitted infections (STIs). If you have had more than one UTI, a cystoscopy or imaging studies may be done to determine the cause of the infections. How is this treated? Treatment for this condition includes:  Antibiotic medicine.  Over-the-counter medicines to treat discomfort.  Drinking enough water to stay hydrated. If you have frequent infections or have other conditions such as a kidney stone, you may need to see a health care provider who specializes in the urinary tract (urologist). In rare cases, urinary tract infections can cause sepsis. Sepsis is a life-threatening condition that occurs when the body responds to an infection. Sepsis is treated in the hospital with IV antibiotics, fluids, and other medicines. Follow these instructions at  home:  Medicines  Take over-the-counter and prescription medicines only as told by your health care provider.  If you were prescribed an antibiotic medicine, take it as told by your health care provider. Do not stop using the antibiotic even if you start to feel better. General instructions  Make sure you: ?  Empty your bladder often and completely. Do not hold urine for long periods of time. ? Empty your bladder after sex. ? Wipe from front to back after a bowel movement if you are female. Use each tissue one time when you wipe.  Drink enough fluid to keep your urine pale yellow.  Keep all follow-up visits as told by your health care provider. This is important. Contact a health care provider if:  Your symptoms do not get better after 1-2 days.  Your symptoms go away and then return. Get help right away if you have:  Severe pain in your back or your lower abdomen.  A fever.  Nausea or vomiting. Summary  A urinary tract infection (UTI) is an infection of any part of the urinary tract, which includes the kidneys, ureters, bladder, and urethra.  Most urinary tract infections are caused by bacteria in your genital area, around the entrance to your urinary tract (urethra).  Treatment for this condition often includes antibiotic medicines.  If you were prescribed an antibiotic medicine, take it as told by your health care provider. Do not stop using the antibiotic even if you start to feel better.  Keep all follow-up visits as told by your health care provider. This is important. This information is not intended to replace advice given to you by your health care provider. Make sure you discuss any questions you have with your health care provider. Document Released: 04/07/2005 Document Revised: 06/15/2018 Document Reviewed: 01/05/2018 Elsevier Patient Education  2020 [employer].

## 2019-03-09 NOTE — Progress Notes (Signed)
Subjective:    Patient ID: Michelle Figueroa, female    DOB: 12/07/1960, 58 y.o.   MRN: [id number]  57y/o caucasian female works at [employer] established patient saw [place] 03/06/2019 diagnosed with UTI with hematuria started on cipro.  Rx for azo didn't start.  Having fever 100,  low back pain, headache, nausea, darker colored urine (gold) "but I know I didn't drink enough water yesterday". + body aches but unsure if that is from fever.  Used to get UTIs all the time when she worked at [employer] and saw Dr Tyrone Apple unsure what antibiotic she was on at that time/some didn't work for her.  Took motrin last night for fever and back pain.  Denied vomiting/diarrhea/abdomen pain/anuria/hand/feet swelling/rash/chest pain/dyspnea.  Patient reported last UTI greater than 1 year ago.  Had colonoscopy performed at [facility] last week/diarrhea with prep.  Covid test negative preprocedure.  Denied loss of bowel/bladder control, saddle paresthesias or arm/leg weakness.  Patient agreed to telephone visit with provider today.     Review of Systems  Constitutional: Positive for fever. Negative for activity change, appetite change, chills, diaphoresis and fatigue.  HENT: Negative for trouble swallowing and voice change.   Eyes: Negative for photophobia and visual disturbance.  Respiratory: Negative for cough, shortness of breath, wheezing and stridor.   Cardiovascular: Negative for chest pain and leg swelling.  Gastrointestinal: Positive for diarrhea and nausea. Negative for abdominal distention, abdominal pain, blood in stool, constipation and vomiting.  Genitourinary: Positive for dysuria. Negative for decreased urine volume, difficulty urinating, dyspareunia, enuresis, flank pain, genital sores, hematuria, menstrual problem, pelvic pain, urgency, vaginal bleeding, vaginal discharge and vaginal pain.  Musculoskeletal: Positive for back pain and myalgias. Negative for gait problem, joint  swelling, neck pain and neck stiffness.  Skin: Negative for color change, pallor, rash and wound.  Allergic/Immunologic: Positive for immunocompromised state. Negative for environmental allergies and food allergies.  Neurological: Positive for headaches. Negative for dizziness, tremors, seizures, syncope, facial asymmetry, speech difficulty, weakness, light-headedness and numbness.  Hematological: Negative for adenopathy. Does not bruise/bleed easily.  Psychiatric/Behavioral: Negative for agitation, confusion and sleep disturbance.       Objective:   Physical Exam Vitals signs reviewed.  Constitutional:      General: She is awake. She is not in acute distress. HENT:     Right Ear: Hearing normal.     Left Ear: Hearing normal.  Cardiovascular:     Rate and Rhythm: Normal rate.     Pulses:          Radial pulses are 2+ on the right side and 2+ on the left side.  Pulmonary:     Effort: Pulmonary effort is normal.     Breath sounds: Normal breath sounds and air entry. No stridor. No wheezing.     Comments: No cough observed; spoke full sentences without difficulty Abdominal:     Palpations: Abdomen is soft.     Tenderness: There is no abdominal tenderness.  Musculoskeletal:     Right shoulder: Normal.     Left shoulder: Normal.     Right elbow: Normal.    Left elbow: Normal.     Right hip: Normal.     Left hip: Normal.     Right knee: Normal.     Left knee: Normal.     Right ankle: Normal.     Left ankle: Normal.     Lumbar back: She exhibits pain. She exhibits normal range of motion, no  tenderness, no bony tenderness, no swelling, no edema, no deformity and no laceration.       Back:     Right hand: Normal.     Left hand: Normal.  Neurological:     Mental Status: She is alert and oriented to person, place, and time.  Psychiatric:        Mood and Affect: Mood normal.        Behavior: Behavior normal. Behavior is cooperative.        Thought Content: Thought content normal.         Judgment: Judgment normal.      Addendum 03/10/2019 nonfasting bmet results elevated glucose and creatinine and decreased GFR.  See lab note avoid nsaids/dehydration.  Tylenol preferred for pain/fever.  Avoid otc medications without first consulting with pharmacist/provider and urine culture still pending I expect results within the next 48 hours.  Drink water to keep urine pale clear yellow and peeing every 4 hours. PCM/UC/ER for re-eval if repetitive vomiting/tea/cola colored urine, worsening back/abdomen pain or unable to pee every 8 hours.     Assessment & Plan:  A-acute cystitis with hematuria  P-failed ciprofloxacin worsening UTI symptoms now nausea, headache, back pain, fever, body aches and darkening urine color. Dipstick urinalysis and urine culture to be sent today.  Patient to walk into [facility] clinic to give sample discussed to sip fluids to ensure can urinate 100-235ml.  I don't want her to drink a liter of water as could dilute sample too much to get good/accurate results.  Discussed with patient I would and did call [facility] to discuss her medical history with RN Raenette Rover as patient unable to remember what antibiotic she used to be prescribed with Dr Tyrone Apple.  RN Raenette Rover confirmed was Rx macrobid.  Electronic Rx sent to her pharmacy of choice for macrobid 100mg  po BID x 7 days #14 RF0 Patient has azo at home she may use po TID prn x 48 hours for dysuria.  Discussed it would make her urine gold/orange colored.   Medications as directed. Patient is also to push noncaffeinated fluids so she urinates every 4-6 hours while awake.  Hydrate, avoid dehydration. Avoid holding urine void on frequent basis every 4 to 6 hours. If unable to void every 8 hours, tea or cola colored urine, repetitive vomiting, worsening back with abdomen pain, fever not responding to new antibiotic/motrin/tylenol follow up for same day re-evaluation with PCM, urgent care or ER. Call or return to  clinic as needed if these symptoms worsen or fail to improve as anticipated.  Discussed signs and symptoms of kidney stones with patient e.g. flank pain radiating to suprapubic/lower quads/inguinal area, dysuria, fever, back pain, and/or n/v. Exitcare handout on cystitis, kidney stones to mychart.  Patient verbalized agreement and understanding of treatment plan and had no further questions at this time.  P2: Hydrate and cranberry juice

## 2019-03-10 ENCOUNTER — Telehealth: Payer: Self-pay | Admitting: Registered Nurse

## 2019-03-10 ENCOUNTER — Encounter: Payer: Self-pay | Admitting: Registered Nurse

## 2019-03-10 DIAGNOSIS — E860 Dehydration: Secondary | ICD-10-CM

## 2019-03-10 LAB — SPECIMEN STATUS

## 2019-03-10 NOTE — Telephone Encounter (Signed)
Telephone and my chart messages left for patient regarding BMET results "Your blood sugar, creatinine were elevated and GFR decreased. Creatinine and GFR are indicators of kidney function and due to increased blood sugar, dehydration, medications and UTI I expected these to be out of normal range but wanted to ensure not higher or lower than expected which can sometimes happen.  Glucose typically elevated due to infection and dehydration.  Lab test was nonfasting.  I recommend avoiding dehydration and NSAIDS e.g. advil/aleve/naprosyn/motrin/ibuprofen/naproxen and other OTC medications without first consulting with a provider or pharmacist. Tylenol 1000mg  by mouth every 6 hours is preferred for fever and muscle aches at this time. Drink water to keep urine pale clear and yellow and urinating every 4 hours. If urine becoming tea/cola colored, unable to pee every 8 hours, repetitive vomiting and/or worsening fever/abdomen pain/back pain please see re-evaluation same day with PCM/UC/ER provider."

## 2019-03-11 LAB — URINE CULTURE: Organism ID, Bacteria: NO GROWTH

## 2019-03-12 LAB — BASIC METABOLIC PANEL
BUN/Creatinine Ratio: 16 (ref 9–23)
BUN: 18 mg/dL (ref 6–24)
CO2: 20 mmol/L (ref 20–29)
Calcium: 10.2 mg/dL (ref 8.7–10.2)
Chloride: 102 mmol/L (ref 96–106)
Creatinine, Ser: 1.12 mg/dL — ABNORMAL HIGH (ref 0.57–1.00)
GFR calc Af Amer: 63 mL/min/{1.73_m2} (ref >59)
GFR calc non Af Amer: 55 mL/min/{1.73_m2} — ABNORMAL LOW (ref >59)
Glucose: 200 mg/dL — ABNORMAL HIGH (ref 65–99)
Potassium: 4.3 mmol/L (ref 3.5–5.2)
Sodium: 137 mmol/L (ref 134–144)

## 2019-03-12 NOTE — Telephone Encounter (Signed)
Here is another message that came to my in basket. I submitted a ticket on my end Friday. I can not figure out what is causing this.  Patient is Michelle Figueroa  Aug 10, 1960

## 2019-03-13 DIAGNOSIS — L570 Actinic keratosis: Secondary | ICD-10-CM | POA: Diagnosis not present

## 2019-03-13 DIAGNOSIS — D485 Neoplasm of uncertain behavior of skin: Secondary | ICD-10-CM | POA: Diagnosis not present

## 2019-03-13 DIAGNOSIS — L820 Inflamed seborrheic keratosis: Secondary | ICD-10-CM | POA: Diagnosis not present

## 2019-03-13 DIAGNOSIS — L578 Other skin changes due to chronic exposure to nonionizing radiation: Secondary | ICD-10-CM | POA: Diagnosis not present

## 2019-03-13 NOTE — Telephone Encounter (Signed)
Noted  

## 2019-03-15 ENCOUNTER — Encounter: Payer: Self-pay | Admitting: Obstetrics & Gynecology

## 2019-03-15 ENCOUNTER — Telehealth: Payer: Self-pay | Admitting: Obstetrics & Gynecology

## 2019-03-15 DIAGNOSIS — Z6832 Body mass index (BMI) 32.0-32.9, adult: Secondary | ICD-10-CM

## 2019-03-15 DIAGNOSIS — E119 Type 2 diabetes mellitus without complications: Secondary | ICD-10-CM

## 2019-03-15 NOTE — Telephone Encounter (Signed)
Patient seen for AEX 01/18/19 Weight loss management discussed.   Routing to Dr. Sabra Heck to advise on referral.

## 2019-03-15 NOTE — Telephone Encounter (Signed)
Patient sent the following message through [facility]. Routing to triage to assist patient with request.  [name], [name]  Phone Number: [phone]        [facility] Dr. Sabra Heck!   I hope you are well and having a good week. I was wondering if you could refer me to Dr. Leafy Ro for weight loss? I have a friend who has lost 26 lbs with her and she is so happy with her results. I thought I could possibly get in faster if I got a referral from you.   Thanks!  Michelle Figueroa

## 2019-03-16 ENCOUNTER — Ambulatory Visit
Admission: RE | Admit: 2019-03-16 | Discharge: 2019-03-16 | Disposition: A | Discharge status: Home / Self Care | Payer: BC Managed Care – PPO | Source: Ambulatory Visit | Attending: Primary Care | Admitting: Primary Care

## 2019-03-16 DIAGNOSIS — Z1231 Encounter for screening mammogram for malignant neoplasm of breast: Secondary | ICD-10-CM | POA: Diagnosis not present

## 2019-03-16 NOTE — Telephone Encounter (Signed)
Referral place.  Please let her know she should get a call from their office.  Thanks.

## 2019-03-16 NOTE — Telephone Encounter (Signed)
Patient notified. Encounter closed

## 2019-03-20 ENCOUNTER — Other Ambulatory Visit: Payer: Self-pay

## 2019-03-20 ENCOUNTER — Ambulatory Visit (INDEPENDENT_AMBULATORY_CARE_PROVIDER_SITE_OTHER): Payer: BC Managed Care – PPO | Admitting: Cardiology

## 2019-03-20 ENCOUNTER — Encounter: Payer: Self-pay | Admitting: Cardiology

## 2019-03-20 VITALS — BP 133/86 | HR 87 | Ht 66.0 in | Wt 195.2 lb

## 2019-03-20 DIAGNOSIS — Z7189 Other specified counseling: Secondary | ICD-10-CM | POA: Diagnosis not present

## 2019-03-20 DIAGNOSIS — Z7182 Exercise counseling: Secondary | ICD-10-CM | POA: Diagnosis not present

## 2019-03-20 DIAGNOSIS — E6609 Other obesity due to excess calories: Secondary | ICD-10-CM

## 2019-03-20 DIAGNOSIS — Z6831 Body mass index (BMI) 31.0-31.9, adult: Secondary | ICD-10-CM

## 2019-03-20 DIAGNOSIS — Z8249 Family history of ischemic heart disease and other diseases of the circulatory system: Secondary | ICD-10-CM | POA: Diagnosis not present

## 2019-03-20 DIAGNOSIS — E7800 Pure hypercholesterolemia, unspecified: Secondary | ICD-10-CM

## 2019-03-20 DIAGNOSIS — Z713 Dietary counseling and surveillance: Secondary | ICD-10-CM

## 2019-03-20 DIAGNOSIS — I10 Essential (primary) hypertension: Secondary | ICD-10-CM

## 2019-03-20 NOTE — Progress Notes (Signed)
Cardiology Office Note:    Date:  03/20/2019   ID:  Michelle Figueroa, DOB 26-Dec-1960, MRN [id number]  PCP:  Pleas Koch, NP  Cardiologist:  Buford Dresser, MD PhD  Referring MD: Megan Salon, MD   CC: new patient evaluation for cardiovascular risk  History of Present Illness:    Michelle Figueroa is a 58 y.o. female with a hx of HTN, HLD, obesity, type II diabetes who is seen as a new consult at the request of Megan Salon, MD for the evaluation and management of CV risk/family history of CV disease.  HPI: She is very concerned about her CV risk. Her brother and father both had Mis in their 61s. She would like to discuss options to help assess her risk, as well as how this risk might be managed. Detailed assessment of risk below.   Cardiovascular risk factors: Prior clinical ASCVD: none Comorbid conditions: She has hypertension, hyperlipidemia, diabetes. NO chronic kidney disease:  Metabolic syndrome/Obesity: Current at highest weight ever, BMI 31. Referred to weight loss specialist but has not yet seen them. Motivated to lose weight. Chronic inflammatory conditions: none Tobacco use history: never Family history: brother had MI in 73s, father had MI in 95s as well. Both were smokers, father died of lung cancer at age 62. Father had triglyceride problem, brother had cholesterol problem. Possibly grandmother had a stroke, unsure. Both grandmothers lived to old age, one age 36 and one age 58. Prior cardiac testing and/or incidental findings on other testing (ie coronary calcium): has talked about calcium score but has not had this testing yet. Exercise level: sedentary. Able to go to work, works at [employer]. Not limited, can climb stairs, etc but no intentional activity. Current diet: since having 10 polyps on colonoscopy 3 weeks ago, cutting back on red meat. Grills a lot of food. Is meat centric, also eats salads, potatoes, etc. [place] sandwich for lunch on typical  day, minimal snacking.  Denies chest pain, shortness of breath at rest or with normal exertion. No PND, orthopnea, LE edema or unexpected weight gain. No syncope or palpitations.  Past Medical History:  Diagnosis Date  . Diabetes mellitus without complication ([place])    followed by PCP  . Diverticulosis    noted on colonoscopy  . Essential hypertension   . GERD (gastroesophageal reflux disease)   . Hemorrhoids   . Insomnia     Past Surgical History:  Procedure Laterality Date  . APPENDECTOMY  2009  . CERVICAL FUSION  2010  . CESAREAN SECTION    . CHOLECYSTECTOMY  2000  . ENDOMETRIAL ABLATION    . GYNECOLOGIC CRYOSURGERY    . RETROPUBIC SLING  10/2009   Dr. Davis Gourd at [facility]  . REVISION URINARY SLING  11/19/2009   with cysto due to urinary retension    Current Medications: Current Outpatient Medications on File Prior to Visit  Medication Sig  . atorvastatin (LIPITOR) 10 MG tablet Take 1 tablet (10 mg total) by mouth daily. For cholesterol.  . citalopram (CELEXA) 10 MG tablet Take 1 tablet (10 mg total) by mouth daily. For anxiety.  Marland Kitchen lisinopril (PRINIVIL,ZESTRIL) 20 MG tablet Take 1 tablet (20 mg total) by mouth daily. For blood pressure.  Marland Kitchen omeprazole (PRILOSEC) 20 MG capsule TAKE 1 CAPSULE EVERY DAY  . Vitamin D, Ergocalciferol, (DRISDOL) 1.25 MG (50000 UT) CAPS capsule Take 1 capsule (50,000 Units total) by mouth every 7 (seven) days.  . metFORMIN (GLUCOPHAGE-XR) 500 MG 24 hr  tablet Take 2 tablets (1,000 mg total) by mouth daily with breakfast. For diabetes. (Patient taking differently: Take 500 mg by mouth daily with breakfast. For diabetes.)   No current facility-administered medications on file prior to visit.      Allergies:   Erythromycin ethylsuccinate and Oxycodone-acetaminophen   Social History   Socioeconomic History  . Marital status: Married    Spouse name: Not on file  . Number of children: Not on file  . Years of education: Not on file  .  Highest education level: Not on file  Occupational History  . Not on file  Social Needs  . Financial resource strain: Not on file  . Food insecurity    Worry: Not on file    Inability: Not on file  . Transportation needs    Medical: Not on file    Non-medical: Not on file  Tobacco Use  . Smoking status: Never Smoker  . Smokeless tobacco: Never Used  Substance and Sexual Activity  . Alcohol use: Yes    Alcohol/week: 3.0 standard drinks    Types: 3 Standard drinks or equivalent per week  . Drug use: No  . Sexual activity: Yes    Birth control/protection: Post-menopausal  Lifestyle  . Physical activity    Days per week: Not on file    Minutes per session: Not on file  . Stress: Not on file  Relationships  . Social Herbalist on phone: Not on file    Gets together: Not on file    Attends religious service: Not on file    Active member of club or organization: Not on file    Attends meetings of clubs or organizations: Not on file    Relationship status: Not on file  Other Topics Concern  . Not on file  Social History Narrative   Married.   2 children.   Works in [employer] at [employer].    Enjoys traveling, spending time with family.      Family History: The patient's family history includes Arthritis in her mother; Colon cancer (age of onset: 40) in her mother; Diabetes in her mother; Heart attack in her father; Heart attack (age of onset: 83) in her brother; Hypertension in her father; Lung cancer in her father. There is no history of Breast cancer.  ROS:   Please see the history of present illness.  Additional pertinent ROS: Constitutional: Negative for chills, fever, night sweats, unintentional weight loss  HENT: Negative for ear pain and hearing loss.   Eyes: Negative for loss of vision and eye pain.  Respiratory: Negative for cough, sputum, wheezing.   Cardiovascular: See HPI. Gastrointestinal: Negative for abdominal pain, melena, and  hematochezia.  Genitourinary: Negative for dysuria and hematuria.  Musculoskeletal: Negative for falls and myalgias.  Skin: Negative for itching and rash.  Neurological: Negative for focal weakness, focal sensory changes and loss of consciousness.  Endo/Heme/Allergies: Does not bruise/bleed easily.     EKGs/Labs/Other Studies Reviewed:    The following studies were reviewed today: Prior PCP notes. No prior cardiac studies.   EKG:  EKG is personally reviewed.  The ekg ordered today demonstrates NSR  Recent Labs: 09/27/2018: ALT 58 03/09/2019: BUN 18; Creatinine, Ser 1.12; Hemoglobin WILL FOLLOW; Platelets WILL FOLLOW; Potassium 4.3; Sodium 137  Recent Lipid Panel    Component Value Date/Time   CHOL 163 08/08/2018 0811   TRIG 139 08/08/2018 0811   HDL 59 08/08/2018 0811   CHOLHDL 2.8 08/08/2018  [id number]   LDLCALC 76 08/08/2018 0811    Physical Exam:    VS:  BP 133/86   Pulse 87   Ht 5\' 6"  (1.676 m)   Wt 195 lb 3.2 oz (88.5 kg)   BMI 31.51 kg/m     Wt Readings from Last 3 Encounters:  03/20/19 195 lb 3.2 oz (88.5 kg)  03/09/19 189 lb (85.7 kg)  03/06/19 195 lb 9.6 oz (88.7 kg)    GEN: Well nourished, well developed in no acute distress HEENT: Normal, moist mucous membranes NECK: No JVD CARDIAC: regular rhythm, normal S1 and S2, no murmurs, rubs, gallops.  Chest: area of inflamed skin at site of recent skin biopsy VASCULAR: Radial and DP pulses 2+ bilaterally. No carotid bruits RESPIRATORY:  Clear to auscultation without rales, wheezing or rhonchi  ABDOMEN: Soft, non-tender, non-distended MUSCULOSKELETAL:  Ambulates independently SKIN: Warm and dry, no edema NEUROLOGIC:  Alert and oriented x 3. No focal neuro deficits noted. PSYCHIATRIC:  Normal affect    ASSESSMENT:    1. Family history of cardiovascular disease   2. Cardiac risk counseling   3. Counseling on health promotion and disease prevention   4. Nutritional counseling   5. Exercise counseling   6. Class 1  obesity due to excess calories without serious comorbidity with body mass index (BMI) of 31.0 to 31.9 in adult   7. Family history of early CAD   59. Essential hypertension   9. Pure hypercholesterolemia    PLAN:    Family history of premature CAD: Both brother and father had Mis in their 92s. While they did both have additional risk factors (tobacco use, dyslipidemia) she is concerned their may be a genetic component -we discussed the ASCVD risk score (below) at length, both what it includes as well as limitations (ie no family history included) -we discussed additional evaluation methods. She is already on statin, so hsCRP and lipoprotein A may be less accurate than prior to statin -discussed calcium score at length. -discussed pathology of cholesterol, how plaques form, that MI/CVA result commonly from acute plaque rupture and not gradual stenosis. Discussed mechanism of statin to both decrease plaque accumulation and stabilize plaque that is already present. Discussed that calcium is a marker for plaque, with decades of validated data regarding average amounts of calcium for age/gender/ethnicity, as well as value of calcium score for risk stratification -also discussed that calcium score does not show soft/fatty plaque--this is limited to coronary CT scan, which is currently not approved for asymptomatic individuals -she is willing to proceed with calcium score, understands it is out of pocket. -continue atorvastatin given her family history, tolerating well  Cardiac risk counseling and prevention recommendations: -recommend heart healthy/Mediterranean diet, with whole grains, fruits, vegetable, fish, lean meats, nuts, and olive oil. Limit salt. -recommend moderate walking, 3-5 times/week for 30-50 minutes each session. Aim for at least 150 minutes.week. Goal should be pace of 3 miles/hours, or walking 1.5 miles in 30 minutes -recommend avoidance of tobacco products. Avoid excess alcohol.  -Additional risk factor control:  -Diabetes: A1c is 6.4, on metformin. Recommended she discuss either SGLT2i or GLP1-RA as diabetes + weight loss measures with her specialist to see if appropriate for her  -Lipids: On atorvastatin, LDL 76, TG 139, HDL 59  Hypertension: within goal range, on lisinopril  -Weight: BMI 31, class 1 obesity. Pending appt with obesity medicine specialist -ASCVD risk score: The 10-year ASCVD risk score Mikey Bussing DC Jr., et al., 2013) is: 4.7%   Values  used to calculate the score:     Age: 31 years     Sex: Female     Is Non-Hispanic African American: No     Diabetic: Yes     Tobacco smoker: No     Systolic Blood Pressure: [id number] mmHg     Is BP treated: Yes     HDL Cholesterol: 59 mg/dL     Total Cholesterol: 163 mg/dL    Plan for follow up: 1 year or sooner PRN  Medication Adjustments/Labs and Tests Ordered: Current medicines are reviewed at length with the patient today.  Concerns regarding medicines are outlined above.  Orders Placed This Encounter  Procedures  . CT CARDIAC SCORING  . EKG 12-Lead   No orders of the defined types were placed in this encounter.   Patient Instructions  Medication Instructions:  Your Physician recommend you continue on your current medication as directed.    If you need a refill on your cardiac medications before your next appointment, please call your pharmacy.   Lab work: None  Testing/Procedures: Calcium Score [address] 300  Follow-Up: At [facility], you and your health needs are our priority.  As part of our continuing mission to provide you with exceptional heart care, we have created designated Provider Care Teams.  These Care Teams include your primary Cardiologist (physician) and Advanced Practice Providers (APPs -  Physician Assistants and Nurse Practitioners) who all work together to provide you with the care you need, when you need it. You will need a follow up appointment in 1 years.   Please call our office 2 months in advance to schedule this appointment.  You may see Dr. Harrell Gave or one of the following Advanced Practice Providers on your designated Care Team:   Rosaria Ferries, PA-C . Jory Sims, DNP, ANP      Signed, Buford Dresser, MD PhD 03/20/2019 6:57 PM    [place]

## 2019-03-20 NOTE — Patient Instructions (Signed)
Medication Instructions:  Your Physician recommend you continue on your current medication as directed.    If you need a refill on your cardiac medications before your next appointment, please call your pharmacy.   Lab work: None  Testing/Procedures: Calcium Score [address]  Follow-Up: At [facility], you and your health needs are our priority.  As part of our continuing mission to provide you with exceptional heart care, we have created designated Provider Care Teams.  These Care Teams include your primary Cardiologist (physician) and Advanced Practice Providers (APPs -  Physician Assistants and Nurse Practitioners) who all work together to provide you with the care you need, when you need it. You will need a follow up appointment in 1 years.  Please call our office 2 months in advance to schedule this appointment.  You may see Dr. Harrell Gave or one of the following Advanced Practice Providers on your designated Care Team:   Rosaria Ferries, PA-C . Jory Sims, DNP, ANP

## 2019-03-24 ENCOUNTER — Encounter: Payer: Self-pay | Admitting: Primary Care

## 2019-03-26 LAB — HM DIABETES EYE EXAM

## 2019-03-27 ENCOUNTER — Encounter: Payer: Self-pay | Admitting: Primary Care

## 2019-03-28 ENCOUNTER — Other Ambulatory Visit: Payer: Self-pay | Admitting: Primary Care

## 2019-03-28 DIAGNOSIS — F411 Generalized anxiety disorder: Secondary | ICD-10-CM

## 2019-03-29 DIAGNOSIS — M1811 Unilateral primary osteoarthritis of first carpometacarpal joint, right hand: Secondary | ICD-10-CM | POA: Diagnosis not present

## 2019-03-29 DIAGNOSIS — M180 Bilateral primary osteoarthritis of first carpometacarpal joints: Secondary | ICD-10-CM | POA: Diagnosis not present

## 2019-03-29 DIAGNOSIS — M65311 Trigger thumb, right thumb: Secondary | ICD-10-CM | POA: Diagnosis not present

## 2019-04-20 ENCOUNTER — Ambulatory Visit (INDEPENDENT_AMBULATORY_CARE_PROVIDER_SITE_OTHER)
Admission: RE | Admit: 2019-04-20 | Discharge: 2019-04-20 | Disposition: A | Discharge status: Home / Self Care | Payer: Self-pay | Source: Ambulatory Visit | Attending: Cardiology | Admitting: Cardiology

## 2019-04-20 ENCOUNTER — Other Ambulatory Visit: Payer: Self-pay

## 2019-04-20 DIAGNOSIS — Z8249 Family history of ischemic heart disease and other diseases of the circulatory system: Secondary | ICD-10-CM

## 2019-04-29 DIAGNOSIS — E119 Type 2 diabetes mellitus without complications: Secondary | ICD-10-CM

## 2019-04-30 MED ORDER — METFORMIN HCL ER 500 MG PO TB24: 1000.0000 mg | ORAL_TABLET | Freq: Every day | ORAL | 0 refills | Status: DC

## 2019-05-02 ENCOUNTER — Ambulatory Visit: Payer: BC Managed Care – PPO

## 2019-05-02 ENCOUNTER — Other Ambulatory Visit: Payer: Self-pay

## 2019-05-02 DIAGNOSIS — Z23 Encounter for immunization: Secondary | ICD-10-CM

## 2019-05-31 ENCOUNTER — Other Ambulatory Visit: Payer: Self-pay

## 2019-05-31 ENCOUNTER — Encounter: Payer: Self-pay | Admitting: Primary Care

## 2019-05-31 ENCOUNTER — Ambulatory Visit: Payer: BC Managed Care – PPO | Admitting: Primary Care

## 2019-05-31 VITALS — BP 140/82 | HR 86 | Temp 97.9°F | Ht 66.0 in | Wt 197.5 lb

## 2019-05-31 DIAGNOSIS — E119 Type 2 diabetes mellitus without complications: Secondary | ICD-10-CM

## 2019-05-31 DIAGNOSIS — I10 Essential (primary) hypertension: Secondary | ICD-10-CM

## 2019-05-31 DIAGNOSIS — E559 Vitamin D deficiency, unspecified: Secondary | ICD-10-CM | POA: Diagnosis not present

## 2019-05-31 DIAGNOSIS — E7800 Pure hypercholesterolemia, unspecified: Secondary | ICD-10-CM | POA: Diagnosis not present

## 2019-05-31 LAB — LIPID PANEL
Cholesterol: 151 mg/dL (ref 0–200)
HDL: 50 mg/dL (ref >39.00)
NonHDL: 101.01
Total CHOL/HDL Ratio: 3
Triglycerides: 207 mg/dL — ABNORMAL HIGH (ref 0.0–149.0)
VLDL: 41.4 mg/dL — ABNORMAL HIGH (ref 0.0–40.0)

## 2019-05-31 LAB — TSH: TSH: 3.25 u[IU]/mL (ref 0.35–4.50)

## 2019-05-31 LAB — VITAMIN D 25 HYDROXY (VIT D DEFICIENCY, FRACTURES): VITD: 40.49 ng/mL (ref 30.00–100.00)

## 2019-05-31 LAB — COMPREHENSIVE METABOLIC PANEL
ALT: 68 U/L — ABNORMAL HIGH (ref 0–35)
AST: 79 U/L — ABNORMAL HIGH (ref 0–37)
Albumin: 4.2 g/dL (ref 3.5–5.2)
Alkaline Phosphatase: 111 U/L (ref 39–117)
BUN: 15 mg/dL (ref 6–23)
CO2: 26 mEq/L (ref 19–32)
Calcium: 9.8 mg/dL (ref 8.4–10.5)
Chloride: 103 mEq/L (ref 96–112)
Creatinine, Ser: 0.76 mg/dL (ref 0.40–1.20)
GFR: 78.43 mL/min (ref >60.00)
Glucose, Bld: 113 mg/dL — ABNORMAL HIGH (ref 70–99)
Potassium: 4.1 mEq/L (ref 3.5–5.1)
Sodium: 139 mEq/L (ref 135–145)
Total Bilirubin: 0.6 mg/dL (ref 0.2–1.2)
Total Protein: 7 g/dL (ref 6.0–8.3)

## 2019-05-31 LAB — LDL CHOLESTEROL, DIRECT: Direct LDL: 71 mg/dL

## 2019-05-31 LAB — POCT GLYCOSYLATED HEMOGLOBIN (HGB A1C): Hemoglobin A1C: 7.4 % — AB (ref 4.0–5.6)

## 2019-05-31 MED ORDER — LISINOPRIL 20 MG PO TABS: 20.0000 mg | ORAL_TABLET | Freq: Every day | ORAL | 3 refills | Status: DC

## 2019-05-31 NOTE — Patient Instructions (Addendum)
Stop by the lab prior to leaving today. I will notify you of your results once received.   Continue exercising. You should be getting 150 minutes of moderate intensity exercise weekly.  It's important to improve your diet by reducing consumption of fast food, fried food, processed snack foods, sugary drinks. Increase consumption of fresh vegetables and fruits, whole grains, water.  Ensure you are drinking 64 ounces of water daily.  Please schedule a physical with me for 3 months. You may also schedule a lab only appointment 3-4 days prior. We will discuss your lab results in detail during your physical.  It was a pleasure to see you today!

## 2019-05-31 NOTE — Assessment & Plan Note (Signed)
Repeat lipids pending.  Discussed the importance of a healthy diet and regular exercise in order for weight loss, and to reduce the risk of any potential medical problems.  

## 2019-05-31 NOTE — Assessment & Plan Note (Addendum)
A1C today of 7.4 which is an increase from last check in May 2020.  Recent renal panel reviewed, consider reducing Metformin to 500 mg once daily.   Consider adding Trulicity once weekly for better glucose control and weight loss effects. Check TSH. Await labs.

## 2019-05-31 NOTE — Progress Notes (Signed)
Subjective:    Patient ID: Michelle Figueroa, female    DOB: 13-Mar-1961, 58 y.o.   MRN: [id number]  HPI  Michelle Figueroa is a 58 year old female who presents today with a chief complaint of obesity and hypertension.   She is currently managed on lisinopril 20 mg but she is taking 10 mg. She's had her BP checked at [facility] with the PT group and readings have been 140's-150's/90's. She has noticed headaches and some dizziness.   She recently started doing some exercise once weekly through the PT program through [facility]. She's doing sit ups, push ups, walking. Prior to this she was "walking" on occasion. She was referred to [facility] [place] and is waiting to get in for an appointment.   She endorses a tough time losing weight over the last one year. She is compliant to Metformin for diabetes but is concerned that her glucose levels are elevated. She is not taking OTC vitamin D. She is checking her glucose levels once daily and is getting readings 120's before breakfast and occasionally in the afternoons when coming home from work which runs in the 70's.   BP Readings from Last 3 Encounters:  05/31/19 140/82  03/20/19 133/86  03/06/19 (!) 141/90   Wt Readings from Last 3 Encounters:  05/31/19 197 lb 8 oz (89.6 kg)  03/20/19 195 lb 3.2 oz (88.5 kg)  03/09/19 189 lb (85.7 kg)     Review of Systems  Eyes: Negative for visual disturbance.  Respiratory: Negative for shortness of breath.   Cardiovascular: Negative for chest pain.  Neurological: Positive for dizziness and headaches.       Past Medical History:  Diagnosis Date  . Diabetes mellitus without complication ([place])    followed by PCP  . Diverticulosis    noted on colonoscopy  . Essential hypertension   . GERD (gastroesophageal reflux disease)   . Hemorrhoids   . Insomnia      Social History   Socioeconomic History  . Marital status: Married    Spouse name: Not on file  . Number of children: Not on  file  . Years of education: Not on file  . Highest education level: Not on file  Occupational History  . Not on file  Social Needs  . Financial resource strain: Not on file  . Food insecurity    Worry: Not on file    Inability: Not on file  . Transportation needs    Medical: Not on file    Non-medical: Not on file  Tobacco Use  . Smoking status: Never Smoker  . Smokeless tobacco: Never Used  Substance and Sexual Activity  . Alcohol use: Yes    Alcohol/week: 3.0 standard drinks    Types: 3 Standard drinks or equivalent per week  . Drug use: No  . Sexual activity: Yes    Birth control/protection: Post-menopausal  Lifestyle  . Physical activity    Days per week: Not on file    Minutes per session: Not on file  . Stress: Not on file  Relationships  . Social Herbalist on phone: Not on file    Gets together: Not on file    Attends religious service: Not on file    Active member of club or organization: Not on file    Attends meetings of clubs or organizations: Not on file    Relationship status: Not on file  . Intimate partner violence  Fear of current or ex partner: Not on file    Emotionally abused: Not on file    Physically abused: Not on file    Forced sexual activity: Not on file  Other Topics Concern  . Not on file  Social History Narrative   Married.   2 children.   Works in [employer] at [employer].    Enjoys traveling, spending time with family.     Past Surgical History:  Procedure Laterality Date  . APPENDECTOMY  2009  . CERVICAL FUSION  2010  . CESAREAN SECTION    . CHOLECYSTECTOMY  2000  . ENDOMETRIAL ABLATION    . GYNECOLOGIC CRYOSURGERY    . RETROPUBIC SLING  10/2009   Dr[facility] Davis [facility]d at [facility]  . REVISION URINARY SLING  11/19/2009   with cysto due to urinary retension    Family History  Problem Relation Age of Onset  . Arthritis Mother   . Diabetes Mother   . Colon cancer Mother 107  . Lung cancer Father   .  Hypertension Father   . Heart attack Father   . Heart attack Brother 14  . Breast cancer Neg Hx     Allergies  Allergen Reactions  . Erythromycin Ethylsuccinate Rash  . Oxycodone-Acetaminophen Nausea Only    Current Outpatient Medications on File Prior to Visit  Medication Sig Dispense Refill  . atorvastatin (LIPITOR) 10 MG tablet Take 1 tablet (10 mg total) by mouth daily. For cholesterol. 90 tablet 1  . citalopram (CELEXA) 10 MG tablet TAKE ONE TABLET BY MOUTH EVERY DAY FOR ANXIETY 90 tablet 0  . metFORMIN (GLUCOPHAGE-XR) 500 MG 24 hr tablet Take 2 tablets (1,000 mg total) by mouth daily with breakfast. For diabetes. 180 tablet 0  . omeprazole (PRILOSEC) 20 MG capsule TAKE 1 CAPSULE EVERY DAY 90 capsule 1  . Vitamin D, Ergocalciferol, (DRISDOL) 1.25 MG (50000 UT) CAPS capsule Take 1 capsule (50,000 Units total) by mouth every 7 (seven) days. 12 capsule 0   No current facility-administered medications on file prior to visit.     BP 140/82   Pulse 86   Temp 97.9 F (36.6 C) (Temporal)   Ht 5\' 6"  (1.676 m)   Wt 197 lb 8 oz (89.6 kg)   SpO2 98%   BMI 31.88 kg/m    Objective:   Physical Exam  Constitutional: She appears well-nourished.  Neck: Neck supple.  Cardiovascular: Normal rate and regular rhythm.  Respiratory: Effort normal and breath sounds normal.  Skin: Skin is warm and dry.  Psychiatric: She has a normal mood and affect.           Assessment & Plan:

## 2019-05-31 NOTE — Assessment & Plan Note (Signed)
Completed course of 50,000 units weekly, not taking OTC treatment.  Repeat vitamin D labs pending.

## 2019-05-31 NOTE — Assessment & Plan Note (Signed)
Above goal in the office today, only taking lisinopril 10 mg. Discussed to increase back up to 20 mg and monitor BP.  CMP pending.

## 2019-06-01 ENCOUNTER — Other Ambulatory Visit: Payer: Self-pay | Admitting: Primary Care

## 2019-06-01 DIAGNOSIS — E119 Type 2 diabetes mellitus without complications: Secondary | ICD-10-CM

## 2019-06-01 MED ORDER — TRULICITY 0.75 MG/0.5ML ~~LOC~~ SOAJ: 0.7500 mg | SUBCUTANEOUS | 2 refills | Status: DC

## 2019-06-01 MED ORDER — LIRAGLUTIDE 18 MG/3ML ~~LOC~~ SOPN: 0.6000 mg | PEN_INJECTOR | Freq: Every day | SUBCUTANEOUS | 2 refills | Status: DC

## 2019-06-01 NOTE — Telephone Encounter (Signed)
Patient called about this and was trying to catch kate before she sends Trulicity and send Victoza instead please review.

## 2019-06-11 ENCOUNTER — Ambulatory Visit (INDEPENDENT_AMBULATORY_CARE_PROVIDER_SITE_OTHER)
Admission: RE | Admit: 2019-06-11 | Discharge: 2019-06-11 | Disposition: A | Discharge status: Home / Self Care | Payer: BC Managed Care – PPO | Source: Ambulatory Visit

## 2019-06-11 DIAGNOSIS — M545 Low back pain, unspecified: Secondary | ICD-10-CM

## 2019-06-11 MED ORDER — CYCLOBENZAPRINE HCL 10 MG PO TABS: 10.0000 mg | ORAL_TABLET | Freq: Two times a day (BID) | ORAL | 0 refills | Status: DC | PRN

## 2019-06-11 MED ORDER — IBUPROFEN 800 MG PO TABS: 800.0000 mg | ORAL_TABLET | Freq: Three times a day (TID) | ORAL | 0 refills | Status: DC | PRN

## 2019-06-11 NOTE — Discharge Instructions (Addendum)
Take the prescribed ibuprofen as needed for your pain.  Take the muscle relaxer Flexeril as needed for muscle spasm; do not drive, operate machinery, or drink alcohol with this medication as it may make you drowsy.    Follow up with an orthopedist if your pain is not improving.  Go to the emergency department if you have worsening pain or develop new symptoms such as difficulty with urination, weakness, numbness, loss of control of your bladder or bowels, fever, or chills.

## 2019-06-11 NOTE — ED Provider Notes (Signed)
Virtual Visit via Video Note:  Michelle Figueroa  initiated request for Telemedicine visit with [facility]. I connected with Michelle Figueroa  on 06/11/2019 at 10:38 AM  for a synchronized telemedicine visit using a video enabled HIPPA compliant telemedicine application. I verified that I am speaking with Michelle Figueroa  using two identifiers. Michelle Balloon, NP  was physically located in a [facility] site and Michelle Figueroa was located at a different location.   The limitations of evaluation and management by telemedicine as well as the availability of in-person appointments were discussed. Patient was informed that she  may incur a bill ( including co-pay) for this virtual visit encounter. Michelle Figueroa  expressed understanding and gave verbal consent to proceed with virtual visit.     History of Present Illness:Michelle Figueroa  is a 58 y.o. female presents for evaluation of 3 day history of lower back pain.  The pain started after bending to pick up something,  is located in her lower back, "dull ache" when sitting, "sharp" with movement, 8/10.  No history of back pain.   Feels like muscle spasm.  Denies numbness, weakness, tingling in LE.  Denies bowel/bladder incontinence.     Allergies  Allergen Reactions  . Erythromycin Ethylsuccinate Rash  . Oxycodone-Acetaminophen Nausea Only     Past Medical History:  Diagnosis Date  . Diabetes mellitus without complication ([place])    followed by PCP  . Diverticulosis    noted on colonoscopy  . Essential hypertension   . GERD (gastroesophageal reflux disease)   . Hemorrhoids   . Insomnia      Social History   Tobacco Use  . Smoking status: Never Smoker  . Smokeless tobacco: Never Used  Substance Use Topics  . Alcohol use: Yes    Alcohol/week: 3.0 standard drinks    Types: 3 Standard drinks or equivalent per week  . Drug use: No        Observations/Objective: Physical Exam  VITALS: Patient  denies fever. GENERAL: Alert, appears well and in no acute distress. HEENT: Atraumatic. NECK: Normal movements of the head and neck. CARDIOPULMONARY: No increased WOB. Speaking in clear sentences. I:E ratio WNL.  MS: Moves all visible extremities without noticeable abnormality. PSYCH: Pleasant and cooperative, well-groomed. Speech normal rate and rhythm. Affect is appropriate. Insight and judgement are appropriate. Attention is focused, linear, and appropriate.  NEURO: CN grossly intact. Oriented as arrived to appointment on time with no prompting. Moves both UE equally.     Assessment and Plan:    ICD-10-CM   1. Acute bilateral low back pain without sciatica  M54.5        Follow Up Instructions: Treating with ibuprofen and Flexeril.  Precautions for drowsiness with Flexeril discussed with patient.  Instructed her to follow-up with an orthopedist if her pain is not improving.  Instructed her to go to the emergency department if she has increased pain or new symptoms such as numbness, weakness, paresthesias, bowel or bladder incontinence, fever, chills, dysuria, or other concerns.  Patient agrees to this plan of care.    I discussed the assessment and treatment plan with the patient. The patient was provided an opportunity to ask questions and all were answered. The patient agreed with the plan and demonstrated an understanding of the instructions.   The patient was advised to call back or seek an in-person evaluation if the symptoms worsen or if the condition fails to  improve as anticipated.      Michelle Balloon, NP  06/11/2019 10:38 AM         Michelle Balloon, NP 06/11/19 1039

## 2019-06-28 DIAGNOSIS — M180 Bilateral primary osteoarthritis of first carpometacarpal joints: Secondary | ICD-10-CM | POA: Diagnosis not present

## 2019-06-28 DIAGNOSIS — M65311 Trigger thumb, right thumb: Secondary | ICD-10-CM | POA: Diagnosis not present

## 2019-08-06 ENCOUNTER — Ambulatory Visit (INDEPENDENT_AMBULATORY_CARE_PROVIDER_SITE_OTHER): Payer: BC Managed Care – PPO | Admitting: Family Medicine

## 2019-08-06 ENCOUNTER — Encounter (INDEPENDENT_AMBULATORY_CARE_PROVIDER_SITE_OTHER): Payer: Self-pay | Admitting: Family Medicine

## 2019-08-12 DIAGNOSIS — E119 Type 2 diabetes mellitus without complications: Secondary | ICD-10-CM

## 2019-08-13 MED ORDER — LIRAGLUTIDE 18 MG/3ML ~~LOC~~ SOPN: 0.6000 mg | PEN_INJECTOR | Freq: Every day | SUBCUTANEOUS | 2 refills | Status: DC

## 2019-08-20 ENCOUNTER — Other Ambulatory Visit: Payer: Self-pay | Admitting: Primary Care

## 2019-08-20 ENCOUNTER — Ambulatory Visit (INDEPENDENT_AMBULATORY_CARE_PROVIDER_SITE_OTHER): Payer: BC Managed Care – PPO | Admitting: Family Medicine

## 2019-08-20 DIAGNOSIS — E559 Vitamin D deficiency, unspecified: Secondary | ICD-10-CM

## 2019-08-20 DIAGNOSIS — E119 Type 2 diabetes mellitus without complications: Secondary | ICD-10-CM

## 2019-08-20 DIAGNOSIS — I10 Essential (primary) hypertension: Secondary | ICD-10-CM

## 2019-08-20 DIAGNOSIS — E7800 Pure hypercholesterolemia, unspecified: Secondary | ICD-10-CM

## 2019-08-28 NOTE — Telephone Encounter (Signed)
Please advise TD was done on 02/10/2016

## 2019-08-30 ENCOUNTER — Other Ambulatory Visit: Payer: BC Managed Care – PPO

## 2019-09-04 ENCOUNTER — Ambulatory Visit (INDEPENDENT_AMBULATORY_CARE_PROVIDER_SITE_OTHER): Payer: BC Managed Care – PPO | Admitting: Primary Care

## 2019-09-04 ENCOUNTER — Other Ambulatory Visit: Payer: Self-pay

## 2019-09-04 ENCOUNTER — Encounter: Payer: Self-pay | Admitting: Primary Care

## 2019-09-04 VITALS — BP 136/86 | HR 85 | Temp 97.3°F | Ht 66.0 in | Wt 190.8 lb

## 2019-09-04 DIAGNOSIS — I10 Essential (primary) hypertension: Secondary | ICD-10-CM

## 2019-09-04 DIAGNOSIS — Z0000 Encounter for general adult medical examination without abnormal findings: Secondary | ICD-10-CM

## 2019-09-04 DIAGNOSIS — Z8249 Family history of ischemic heart disease and other diseases of the circulatory system: Secondary | ICD-10-CM

## 2019-09-04 DIAGNOSIS — E119 Type 2 diabetes mellitus without complications: Secondary | ICD-10-CM | POA: Diagnosis not present

## 2019-09-04 DIAGNOSIS — E7800 Pure hypercholesterolemia, unspecified: Secondary | ICD-10-CM

## 2019-09-04 DIAGNOSIS — K219 Gastro-esophageal reflux disease without esophagitis: Secondary | ICD-10-CM

## 2019-09-04 DIAGNOSIS — E559 Vitamin D deficiency, unspecified: Secondary | ICD-10-CM | POA: Diagnosis not present

## 2019-09-04 DIAGNOSIS — F411 Generalized anxiety disorder: Secondary | ICD-10-CM

## 2019-09-04 LAB — COMPREHENSIVE METABOLIC PANEL
ALT: 62 U/L — ABNORMAL HIGH (ref 0–35)
AST: 59 U/L — ABNORMAL HIGH (ref 0–37)
Albumin: 4.4 g/dL (ref 3.5–5.2)
Alkaline Phosphatase: 103 U/L (ref 39–117)
BUN: 16 mg/dL (ref 6–23)
CO2: 30 mEq/L (ref 19–32)
Calcium: 10.1 mg/dL (ref 8.4–10.5)
Chloride: 103 mEq/L (ref 96–112)
Creatinine, Ser: 0.78 mg/dL (ref 0.40–1.20)
GFR: 76.04 mL/min (ref >60.00)
Glucose, Bld: 78 mg/dL (ref 70–99)
Potassium: 4 mEq/L (ref 3.5–5.1)
Sodium: 140 mEq/L (ref 135–145)
Total Bilirubin: 0.6 mg/dL (ref 0.2–1.2)
Total Protein: 7.1 g/dL (ref 6.0–8.3)

## 2019-09-04 LAB — VITAMIN D 25 HYDROXY (VIT D DEFICIENCY, FRACTURES): VITD: 36.62 ng/mL (ref 30.00–100.00)

## 2019-09-04 LAB — LIPID PANEL
Cholesterol: 166 mg/dL (ref 0–200)
HDL: 50.6 mg/dL (ref >39.00)
NonHDL: 115.55
Total CHOL/HDL Ratio: 3
Triglycerides: 254 mg/dL — ABNORMAL HIGH (ref 0.0–149.0)
VLDL: 50.8 mg/dL — ABNORMAL HIGH (ref 0.0–40.0)

## 2019-09-04 LAB — CBC
HCT: 42.9 % (ref 36.0–46.0)
Hemoglobin: 14.5 g/dL (ref 12.0–15.0)
MCHC: 33.7 g/dL (ref 30.0–36.0)
MCV: 90.2 fl (ref 78.0–100.0)
Platelets: 164 10*3/uL (ref 150.0–400.0)
RBC: 4.76 Mil/uL (ref 3.87–5.11)
RDW: 14 % (ref 11.5–15.5)
WBC: 6.8 10*3/uL (ref 4.0–10.5)

## 2019-09-04 LAB — LDL CHOLESTEROL, DIRECT: Direct LDL: 75 mg/dL

## 2019-09-04 MED ORDER — LISINOPRIL 30 MG PO TABS: 30.0000 mg | ORAL_TABLET | Freq: Every day | ORAL | 3 refills | Status: DC

## 2019-09-04 NOTE — Patient Instructions (Addendum)
We've increased the dose of your lisinopril to 30 mg. Monitor your blood pressure and notify me if you see readings at or above 130/90 on a consistent basis.  Start exercising. You should be getting 150 minutes of moderate intensity exercise weekly.  Continue to work on a healthy diet. Ensure you are consuming 64 ounces of water daily.  Schedule nurse visits for the Shingles vaccinations. The second dose is 2-6 months after the first.  Please schedule a follow up appointment in 6 months for diabetes check.   It was a pleasure to see you today!   Preventive Care 84-75 Years Old, Female Preventive care refers to visits with your health care provider and lifestyle choices that can promote health and wellness. This includes:  A yearly physical exam. This may also be called an annual well check.  Regular dental visits and eye exams.  Immunizations.  Screening for certain conditions.  Healthy lifestyle choices, such as eating a healthy diet, getting regular exercise, not using drugs or products that contain nicotine and tobacco, and limiting alcohol use. What can I expect for my preventive care visit? Physical exam Your health care provider will check your:  Height and weight. This may be used to calculate body mass index (BMI), which tells if you are at a healthy weight.  Heart rate and blood pressure.  Skin for abnormal spots. Counseling Your health care provider may ask you questions about your:  Alcohol, tobacco, and drug use.  Emotional well-being.  Home and relationship well-being.  Sexual activity.  Eating habits.  Work and work Statistician.  Method of birth control.  Menstrual cycle.  Pregnancy history. What immunizations do I need?  Influenza (flu) vaccine  This is recommended every year. Tetanus, diphtheria, and pertussis (Tdap) vaccine  You may need a Td booster every 10 years. Varicella (chickenpox) vaccine  You may need this if you have not been  vaccinated. Zoster (shingles) vaccine  You may need this after age 58. Measles, mumps, and rubella (MMR) vaccine  You may need at least one dose of MMR if you were born in 1957 or later. You may also need a second dose. Pneumococcal conjugate (PCV13) vaccine  You may need this if you have certain conditions and were not previously vaccinated. Pneumococcal polysaccharide (PPSV23) vaccine  You may need one or two doses if you smoke cigarettes or if you have certain conditions. Meningococcal conjugate (MenACWY) vaccine  You may need this if you have certain conditions. Hepatitis A vaccine  You may need this if you have certain conditions or if you travel or work in places where you may be exposed to hepatitis A. Hepatitis B vaccine  You may need this if you have certain conditions or if you travel or work in places where you may be exposed to hepatitis B. Haemophilus influenzae type b (Hib) vaccine  You may need this if you have certain conditions. Human papillomavirus (HPV) vaccine  If recommended by your health care provider, you may need three doses over 6 months. You may receive vaccines as individual doses or as more than one vaccine together in one shot (combination vaccines). Talk with your health care provider about the risks and benefits of combination vaccines. What tests do I need? Blood tests  Lipid and cholesterol levels. These may be checked every 5 years, or more frequently if you are over 29 years old.  Hepatitis C test.  Hepatitis B test. Screening  Lung cancer screening. You may have this screening  every year starting at age 58. if you have a 30-pack-year history of smoking and currently smoke or have quit within the past 15 years.  Colorectal cancer screening. All adults should have this screening starting at age 58. and continuing until age 72. Your health care provider may recommend screening at age 58 if you are at increased risk. You will have tests every  1-10 years, depending on your results and the type of screening test.  Diabetes screening. This is done by checking your blood sugar (glucose) after you have not eaten for a while (fasting). You may have this done every 1-3 years.  Mammogram. This may be done every 1-2 years. Talk with your health care provider about when you should start having regular mammograms. This may depend on whether you have a family history of breast cancer.  BRCA-related cancer screening. This may be done if you have a family history of breast, ovarian, tubal, or peritoneal cancers.  Pelvic exam and Pap test. This may be done every 3 years starting at age 33. Starting at age 58, this may be done every 5 years if you have a Pap test in combination with an HPV test. Other tests  Sexually transmitted disease (STD) testing.  Bone density scan. This is done to screen for osteoporosis. You may have this scan if you are at high risk for osteoporosis. Follow these instructions at home: Eating and drinking  Eat a diet that includes fresh fruits and vegetables, whole grains, lean protein, and low-fat dairy.  Take vitamin and mineral supplements as recommended by your health care provider.  Do not drink alcohol if: ? Your health care provider tells you not to drink. ? You are pregnant, may be pregnant, or are planning to become pregnant.  If you drink alcohol: ? Limit how much you have to 0-1 drink a day. ? Be aware of how much alcohol is in your drink. In the [place] one drink equals one 12 oz bottle of beer (355 mL), one 5 oz glass of wine (148 mL), or one 1 oz glass of hard liquor (44 mL). Lifestyle  Take daily care of your teeth and gums.  Stay active. Exercise for at least 30 minutes on 5 or more days each week.  Do not use any products that contain nicotine or tobacco, such as cigarettes, e-cigarettes, and chewing tobacco. If you need help quitting, ask your health care provider.  If you are sexually active,  practice safe sex. Use a condom or other form of birth control (contraception) in order to prevent pregnancy and STIs (sexually transmitted infections).  If told by your health care provider, take low-dose aspirin daily starting at age 64. What's next?  Visit your health care provider once a year for a well check visit.  Ask your health care provider how often you should have your eyes and teeth checked.  Stay up to date on all vaccines. This information is not intended to replace advice given to you by your health care provider. Make sure you discuss any questions you have with your health care provider. Document Revised: 03/09/2018 Document Reviewed: 03/09/2018 Elsevier Patient Education  2020 [employer].

## 2019-09-04 NOTE — Assessment & Plan Note (Signed)
Compliant to statin therapy, repeat lipids pending. °

## 2019-09-04 NOTE — Assessment & Plan Note (Signed)
Doing well on citalopram 10 mg, continue same.

## 2019-09-04 NOTE — Progress Notes (Signed)
Subjective:    Patient ID: Michelle Figueroa, female    DOB: Jun 12, 1961, 59 y.o.   MRN: [id number]  HPI  This visit occurred during the SARS-CoV-2 public health emergency.  Safety protocols were in place, including screening questions prior to the visit, additional usage of staff PPE, and extensive cleaning of exam room while observing appropriate contact time as indicated for disinfecting solutions.   Michelle Figueroa is a 59 year old female who presents today for complete physical.  Immunizations: -Tetanus: Completed in 2017 -Influenza: Completed this season  -Shingles: Never completed.  -Pneumonia: Completed in 2020 -Covid-19 vaccine: Completed first dose on 08/23/19, due again 09/13/19.  Diet: She endorses a healthy diet.  Exercise: She is walking some.   Eye exam: Completes annually  Dental exam: Follows semi-annually   Pap Smear: Completed in 2020, due in 2023 Mammogram: Completed in September 2020 Colonoscopy: Completed in 2020, due in 2023 Hep C Screen: Negative  BP Readings from Last 3 Encounters:  09/04/19 136/86  05/31/19 140/82  03/20/19 133/86   Wt Readings from Last 3 Encounters:  09/04/19 190 lb 12 oz (86.5 kg)  05/31/19 197 lb 8 oz (89.6 kg)  03/20/19 195 lb 3.2 oz (88.5 kg)      Review of Systems  Constitutional: Negative for unexpected weight change.  HENT: Negative for rhinorrhea.   Respiratory: Negative for cough and shortness of breath.   Cardiovascular: Negative for chest pain.  Gastrointestinal: Negative for constipation and diarrhea.  Genitourinary: Negative for difficulty urinating.  Musculoskeletal: Negative for arthralgias.  Skin: Negative for rash.  Allergic/Immunologic: Negative for environmental allergies.  Neurological: Negative for dizziness, numbness and headaches.  Psychiatric/Behavioral: The patient is not nervous/anxious.        Doing well on citalopram       Past Medical History:  Diagnosis Date  . Diabetes mellitus without  complication ([place])    followed by PCP  . Diverticulosis    noted on colonoscopy  . Essential hypertension   . GERD (gastroesophageal reflux disease)   . Hemorrhoids   . Insomnia      Social History   Socioeconomic History  . Marital status: Married    Spouse name: Not on file  . Number of children: Not on file  . Years of education: Not on file  . Highest education level: Not on file  Occupational History  . Not on file  Tobacco Use  . Smoking status: Never Smoker  . Smokeless tobacco: Never Used  Substance and Sexual Activity  . Alcohol use: Yes    Alcohol/week: 3.0 standard drinks    Types: 3 Standard drinks or equivalent per week  . Drug use: No  . Sexual activity: Yes    Birth control/protection: Post-menopausal  Other Topics Concern  . Not on file  Social History Narrative   Married.   2 children.   Works in [employer] at [employer].    Enjoys traveling, spending time with family.    Social Determinants of Health   Financial Resource Strain:   . Difficulty of Paying Living Expenses: Not on file  Food Insecurity:   . Worried About Charity fundraiser in the Last Year: Not on file  . Ran Out of Food in the Last Year: Not on file  Transportation Needs:   . Lack of Transportation (Medical): Not on file  . Lack of Transportation (Non-Medical): Not on file  Physical Activity:   . Days of Exercise per Week: Not on  file  . Minutes of Exercise per Session: Not on file  Stress:   . Feeling of Stress : Not on file  Social Connections:   . Frequency of Communication with Friends and Family: Not on file  . Frequency of Social Gatherings with Friends and Family: Not on file  . Attends Religious Services: Not on file  . Active Member of Clubs or Organizations: Not on file  . Attends Archivist Meetings: Not on file  . Marital Status: Not on file  Intimate Partner Violence:   . Fear of Current or Ex-Partner: Not on file  . Emotionally Abused: Not  on file  . Physically Abused: Not on file  . Sexually Abused: Not on file    Past Surgical History:  Procedure Laterality Date  . APPENDECTOMY  2009  . CERVICAL FUSION  2010  . CESAREAN SECTION    . CHOLECYSTECTOMY  2000  . ENDOMETRIAL ABLATION    . GYNECOLOGIC CRYOSURGERY    . RETROPUBIC SLING  10/2009   Dr[facility] Davis Gourd at [facility]  . REVISION URINARY SLING  11/19/2009   with cysto due to urinary retension    Family History  Problem Relation Age of Onset  . Arthritis Mother   . Diabetes Mother   . Colon cancer Mother 70  . Lung cancer Father   . Hypertension Father   . Heart attack Father   . Heart attack Brother 53  . Breast cancer Neg Hx     Allergies  Allergen Reactions  . Erythromycin Ethylsuccinate Rash  . Oxycodone-Acetaminophen Nausea Only    Current Outpatient Medications on File Prior to Visit  Medication Sig Dispense Refill  . atorvastatin (LIPITOR) 10 MG tablet Take 1 tablet (10 mg total) by mouth daily. For cholesterol. 90 tablet 1  . citalopram (CELEXA) 10 MG tablet TAKE ONE TABLET BY MOUTH EVERY DAY FOR ANXIETY 90 tablet 0  . liraglutide (VICTOZA) 18 MG/3ML SOPN Inject 0.1 mLs (0.6 mg total) into the skin daily. For diabetes. 3 mL 2  . metFORMIN (GLUCOPHAGE-XR) 500 MG 24 hr tablet Take 2 tablets (1,000 mg total) by mouth daily with breakfast. For diabetes. 180 tablet 0   No current facility-administered medications on file prior to visit.    BP 136/86   Pulse 85   Temp (!) 97.3 F (36.3 C) (Temporal)   Ht 5\' 6"  (1.676 m)   Wt 190 lb 12 oz (86.5 kg)   SpO2 98%   BMI 30.79 kg/m    Objective:   Physical Exam  Constitutional: She is oriented to person, place, and time. She appears well-nourished.  HENT:  Right Ear: Tympanic membrane and ear canal normal.  Left Ear: Tympanic membrane and ear canal normal.  Mouth/Throat: Oropharynx is clear and moist.  Eyes: Pupils are equal, round, and reactive to light. EOM are normal.    Cardiovascular: Normal rate and regular rhythm.  Respiratory: Effort normal and breath sounds normal.  GI: Soft. Bowel sounds are normal. There is no abdominal tenderness.  Musculoskeletal:        General: Normal range of motion.     Cervical back: Neck supple.  Neurological: She is alert and oriented to person, place, and time. No cranial nerve deficit.  Reflex Scores:      Patellar reflexes are 2+ on the right side and 2+ on the left side. Skin: Skin is warm and dry.  Psychiatric: She has a normal mood and affect.  Assessment & Plan:

## 2019-09-04 NOTE — Assessment & Plan Note (Signed)
Statin therapy in place. Working to lower BP and manage diabetes. Labs pending.

## 2019-09-04 NOTE — Assessment & Plan Note (Signed)
Chronic PPI use for 10+ years, well controlled. Will have her try to wean down and eventually off.  Discussed every other day use for 4-6 weeks, then 2-3 times weekly, then stop. Use Tums in between if needed. She will update.

## 2019-09-04 NOTE — Assessment & Plan Note (Signed)
Borderline in the office today, also with home readings. Increase lisinopril to 30 mg.  She will notify if she continues to see readings at or above 130/90.

## 2019-09-04 NOTE — Assessment & Plan Note (Signed)
Repeat vitamin D pending. 

## 2019-09-04 NOTE — Assessment & Plan Note (Signed)
Immunizations UTD. She will get the Shingrix series once finished with Covid-19 series. Pap smear UTD. Mammogram UTD. Colonoscopy UTD, due in 2023.  Commended her on weight loss, encouraged to continue. Exam today unremarkable. Labs pending.

## 2019-09-04 NOTE — Assessment & Plan Note (Signed)
Repeat A1C pending. Compliant to Victoza and Metformin.  Foot exam today. Eye exam UTD.  Managed on ACE-I and statin. Pneumonia vaccination UTD.  Follow up in 6 months.

## 2019-09-06 LAB — HEMOGLOBIN A1C: Hgb A1c MFr Bld: 5.7 % (ref 4.6–6.5)

## 2019-09-24 ENCOUNTER — Other Ambulatory Visit: Payer: Self-pay | Admitting: Primary Care

## 2019-09-24 DIAGNOSIS — F411 Generalized anxiety disorder: Secondary | ICD-10-CM

## 2019-09-24 DIAGNOSIS — E781 Pure hyperglyceridemia: Secondary | ICD-10-CM

## 2019-10-23 ENCOUNTER — Other Ambulatory Visit: Payer: Self-pay | Admitting: Primary Care

## 2019-10-23 DIAGNOSIS — E119 Type 2 diabetes mellitus without complications: Secondary | ICD-10-CM

## 2019-10-23 DIAGNOSIS — K219 Gastro-esophageal reflux disease without esophagitis: Secondary | ICD-10-CM

## 2019-10-30 DIAGNOSIS — M65311 Trigger thumb, right thumb: Secondary | ICD-10-CM | POA: Diagnosis not present

## 2019-11-05 DIAGNOSIS — K219 Gastro-esophageal reflux disease without esophagitis: Secondary | ICD-10-CM

## 2019-11-05 MED ORDER — OMEPRAZOLE 20 MG PO CPDR: 20.0000 mg | DELAYED_RELEASE_CAPSULE | Freq: Every day | ORAL | 1 refills | Status: DC

## 2019-11-07 DIAGNOSIS — Z20822 Contact with and (suspected) exposure to covid-19: Secondary | ICD-10-CM | POA: Diagnosis not present

## 2019-11-07 DIAGNOSIS — Z01812 Encounter for preprocedural laboratory examination: Secondary | ICD-10-CM | POA: Diagnosis not present

## 2019-11-09 DIAGNOSIS — M65311 Trigger thumb, right thumb: Secondary | ICD-10-CM | POA: Diagnosis not present

## 2019-11-09 DIAGNOSIS — Z7984 Long term (current) use of oral hypoglycemic drugs: Secondary | ICD-10-CM | POA: Diagnosis not present

## 2019-11-09 DIAGNOSIS — E785 Hyperlipidemia, unspecified: Secondary | ICD-10-CM | POA: Diagnosis not present

## 2019-11-09 DIAGNOSIS — M65312 Trigger thumb, left thumb: Secondary | ICD-10-CM | POA: Diagnosis not present

## 2019-11-09 DIAGNOSIS — I10 Essential (primary) hypertension: Secondary | ICD-10-CM | POA: Diagnosis not present

## 2019-11-09 DIAGNOSIS — E119 Type 2 diabetes mellitus without complications: Secondary | ICD-10-CM | POA: Diagnosis not present

## 2019-11-09 DIAGNOSIS — E7800 Pure hypercholesterolemia, unspecified: Secondary | ICD-10-CM | POA: Diagnosis not present

## 2019-11-23 DIAGNOSIS — M189 Osteoarthritis of first carpometacarpal joint, unspecified: Secondary | ICD-10-CM | POA: Diagnosis not present

## 2020-01-30 ENCOUNTER — Other Ambulatory Visit: Payer: Self-pay

## 2020-01-30 ENCOUNTER — Ambulatory Visit: Payer: BC Managed Care – PPO | Admitting: Primary Care

## 2020-01-30 ENCOUNTER — Encounter: Payer: Self-pay | Admitting: Primary Care

## 2020-01-30 VITALS — BP 140/84 | HR 85 | Temp 95.8°F | Ht 66.0 in | Wt 188.5 lb

## 2020-01-30 DIAGNOSIS — R21 Rash and other nonspecific skin eruption: Secondary | ICD-10-CM | POA: Diagnosis not present

## 2020-01-30 MED ORDER — NYSTATIN 100000 UNIT/GM EX CREA: 1.0000 "application " | TOPICAL_CREAM | Freq: Two times a day (BID) | CUTANEOUS | 0 refills | Status: DC

## 2020-01-30 NOTE — Assessment & Plan Note (Signed)
Acute for the last several months, no resolve. Rash today appears yeast/fungal in appearance. This is likely due to warmer weather and natural skin folds holding moisture.   Trial of Nystatin cream sent to pharmacy. She will update if no improvement in one week.

## 2020-01-30 NOTE — Progress Notes (Signed)
Subjective:    Patient ID: Michelle Figueroa, female    DOB: [birth year], 59 y.o.   MRN: [id number]  HPI  This visit occurred during the SARS-CoV-2 public health emergency.  Safety protocols were in place, including screening questions prior to the visit, additional usage of staff PPE, and extensive cleaning of exam room while observing appropriate contact time as indicated for disinfecting solutions.   Michelle Figueroa is a 59 year old female with a history of hypertension, type 2 diabetes, GAD, hyperlipidemia who presents today with a chief complaint of skin irritation.  She's noticed skin irritation under her left breast and bilateral groin which began several months ago. Gradually her irritation has increased and is now quite bothersome. She's been to the beach several times. She's applied topical antibiotic cream to her left breast with some improvement.   She describes the sites of her skin as burning and sore, like an open wound. She denies changes in soaps, detergents, food. She is checking her blood pressure at home which is running 120's-130's/80's.   BP Readings from Last 3 Encounters:  01/30/20 140/84  09/04/19 136/86  05/31/19 140/82   Wt Readings from Last 3 Encounters:  01/30/20 188 lb 8 oz (85.5 kg)  09/04/19 190 lb 12 oz (86.5 kg)  05/31/19 197 lb 8 oz (89.6 kg)     Review of Systems  Constitutional: Negative for fever.  Skin:       Skin irritation        Past Medical History:  Diagnosis Date  . Diabetes mellitus without complication ([place])    followed by PCP  . Diverticulosis    noted on colonoscopy  . Essential hypertension   . GERD (gastroesophageal reflux disease)   . Hemorrhoids   . Insomnia      Social History   Socioeconomic History  . Marital status: Married    Spouse name: Not on file  . Number of children: Not on file  . Years of education: Not on file  . Highest education level: Not on file  Occupational History  . Not on file  Tobacco Use    . Smoking status: Never Smoker  . Smokeless tobacco: Never Used  Vaping Use  . Vaping Use: Never used  Substance and Sexual Activity  . Alcohol use: Yes    Alcohol/week: 3.0 standard drinks    Types: 3 Standard drinks or equivalent per week  . Drug use: No  . Sexual activity: Yes    Birth control/protection: Post-menopausal  Other Topics Concern  . Not on file  Social History Narrative   Married.   2 children.   Works in [employer] at [employer].    Enjoys traveling, spending time with family.    Social Determinants of Health   Financial Resource Strain:   . Difficulty of Paying Living Expenses:   Food Insecurity:   . Worried About Charity fundraiser in the Last Year:   . Arboriculturist in the Last Year:   Transportation Needs:   . Film/video editor (Medical):   Marland Kitchen Lack of Transportation (Non-Medical):   Physical Activity:   . Days of Exercise per Week:   . Minutes of Exercise per Session:   Stress:   . Feeling of Stress :   Social Connections:   . Frequency of Communication with Friends and Family:   . Frequency of Social Gatherings with Friends and Family:   . Attends Religious Services:   . Active  Member of Clubs or Organizations:   . Attends Archivist Meetings:   Marland Kitchen Marital Status:   Intimate Partner Violence:   . Fear of Current or Ex-Partner:   . Emotionally Abused:   Marland Kitchen Physically Abused:   . Sexually Abused:     Past Surgical History:  Procedure Laterality Date  . APPENDECTOMY  2009  . CERVICAL FUSION  2010  . CESAREAN SECTION    . CHOLECYSTECTOMY  2000  . ENDOMETRIAL ABLATION    . GYNECOLOGIC CRYOSURGERY    . RETROPUBIC SLING  10/2009   Dr. Davis Gourd at [facility]  . REVISION URINARY SLING  11/19/2009   with cysto due to urinary retension    Family History  Problem Relation Age of Onset  . Arthritis Mother   . Diabetes Mother   . Colon cancer Mother 77  . Lung cancer Father   . Hypertension Father   . Heart  attack Father   . Heart attack Brother 108  . Breast cancer Neg Hx     Allergies  Allergen Reactions  . Erythromycin Ethylsuccinate Rash  . Oxycodone-Acetaminophen Nausea Only    Current Outpatient Medications on File Prior to Visit  Medication Sig Dispense Refill  . atorvastatin (LIPITOR) 10 MG tablet TAKE 1 TABLET BY MOUTH DAILY FOR CHOLESTEROL 90 tablet 1  . citalopram (CELEXA) 10 MG tablet TAKE ONE TABLET EVERY DAY FOR ANXIETY 90 tablet 1  . liraglutide (VICTOZA) 18 MG/3ML SOPN Inject 0.1 mLs (0.6 mg total) into the skin daily. For diabetes. 3 mL 2  . lisinopril (ZESTRIL) 30 MG tablet Take 1 tablet (30 mg total) by mouth daily. For blood pressure. 90 tablet 3  . metFORMIN (GLUCOPHAGE-XR) 500 MG 24 hr tablet TAKE TWO TABLETS BY MOUTH WITH BREAKFAST 180 tablet 1  . Multiple Vitamin (MULTIVITAMIN) tablet Take 1 tablet by mouth daily.    Marland Kitchen omeprazole (PRILOSEC) 20 MG capsule Take 1 capsule (20 mg total) by mouth daily. For heartburn. 90 capsule 1  . VITAMIN D PO Take by mouth.     No current facility-administered medications on file prior to visit.    BP 140/84   Pulse 85   Temp (!) 95.8 F (35.4 C) (Temporal)   Ht 5\' 6"  (1.676 m)   Wt 188 lb 8 oz (85.5 kg)   SpO2 96%   BMI 30.42 kg/m    Objective:   Physical Exam Skin:    General: Skin is warm and dry.     Comments: Mild erythema to bilateral groin and under left breast. Evidence of yeast like rash present.   Neurological:     Mental Status: She is alert.            Assessment & Plan:

## 2020-01-30 NOTE — Patient Instructions (Signed)
Start nystatin cream and apply twice daily for 1-2 weeks, then as needed.  Try to keep those areas dry when able.   Please update me if no improvement after one week.  It was a pleasure to see you today!

## 2020-02-06 ENCOUNTER — Other Ambulatory Visit: Payer: Self-pay | Admitting: Primary Care

## 2020-02-06 DIAGNOSIS — Z1231 Encounter for screening mammogram for malignant neoplasm of breast: Secondary | ICD-10-CM

## 2020-02-12 ENCOUNTER — Telehealth: Payer: Self-pay | Admitting: *Deleted

## 2020-02-12 NOTE — Telephone Encounter (Signed)
Michelle Figueroa will call back to schedule her follow up visit.

## 2020-02-14 ENCOUNTER — Other Ambulatory Visit: Payer: Self-pay

## 2020-02-14 ENCOUNTER — Ambulatory Visit: Payer: Self-pay | Admitting: Medical

## 2020-02-14 ENCOUNTER — Telehealth: Payer: Self-pay | Admitting: Medical

## 2020-02-14 DIAGNOSIS — R0989 Other specified symptoms and signs involving the circulatory and respiratory systems: Secondary | ICD-10-CM

## 2020-02-14 DIAGNOSIS — R059 Cough, unspecified: Secondary | ICD-10-CM

## 2020-02-14 LAB — POC COVID19 BINAXNOW: SARS Coronavirus 2 Ag: NEGATIVE

## 2020-02-14 NOTE — Progress Notes (Signed)
   Subjective:    Patient ID: Michelle Figueroa, female    DOB: 01-10-1961, 59 y.o.   MRN: [id number]  HPI  59 yo female in non acute distress gives consents to telemedicine appointment. Symptoms started. Called no answer.  Second calll 11:38 still got voice mail. Called pateint at  3/20pm she states she was in meetings earlier.  St, congested in the nose, cough, clearing throat, just not feeling well. Took Tylenol today. 57 month old Granddaughter sick  With a cold.   Covid-19 vaccine Pfizer last does March 8th  2021.  Review of Systems  Constitutional: Negative for chills, fatigue and fever.  HENT: Positive for sneezing and sore throat.   Respiratory: Positive for cough. Negative for chest tightness, shortness of breath and wheezing.   Cardiovascular: Negative for chest pain.  Gastrointestinal: Negative for diarrhea, nausea and vomiting.  Musculoskeletal: Negative for myalgias.  Neurological: Negative.     Her Mother is  in rehab after fracturing hip last Tuesday.    Objective:   Physical Exam AXOX3 No physical was preformed due to telemedicine appointment.     Covid-19 test negaive Assessment & Plan:  Viral illness Rest, increase fluids/gatorade , OTC Tylenol per packaqe instructions. May return to work if symptoms are resolving and no fever x 24 hours without Motrin or Tylenol on board. Return to clinic as needed. Patient verbalizes understanding and has no questions at discharge.

## 2020-02-14 NOTE — Progress Notes (Signed)
   Subjective:    Patient ID: Michelle Figueroa, female    DOB: 02-03-1961, 59 y.o.   MRN: [id number]  HPI    Review of Systems     Objective:   Physical Exam        Assessment & Plan:

## 2020-02-15 LAB — NOVEL CORONAVIRUS, NAA: SARS-CoV-2, NAA: NOT DETECTED

## 2020-02-15 LAB — SARS-COV-2, NAA 2 DAY TAT

## 2020-03-03 ENCOUNTER — Other Ambulatory Visit: Payer: Self-pay

## 2020-03-03 ENCOUNTER — Ambulatory Visit: Payer: BC Managed Care – PPO | Admitting: Primary Care

## 2020-03-03 ENCOUNTER — Encounter: Payer: Self-pay | Admitting: Primary Care

## 2020-03-03 VITALS — BP 132/86 | HR 82 | Temp 96.1°F | Ht 66.0 in | Wt 176.8 lb

## 2020-03-03 DIAGNOSIS — E119 Type 2 diabetes mellitus without complications: Secondary | ICD-10-CM

## 2020-03-03 DIAGNOSIS — E7800 Pure hypercholesterolemia, unspecified: Secondary | ICD-10-CM | POA: Diagnosis not present

## 2020-03-03 DIAGNOSIS — I10 Essential (primary) hypertension: Secondary | ICD-10-CM

## 2020-03-03 DIAGNOSIS — E559 Vitamin D deficiency, unspecified: Secondary | ICD-10-CM

## 2020-03-03 LAB — COMPREHENSIVE METABOLIC PANEL
ALT: 47 U/L — ABNORMAL HIGH (ref 0–35)
AST: 40 U/L — ABNORMAL HIGH (ref 0–37)
Albumin: 4.5 g/dL (ref 3.5–5.2)
Alkaline Phosphatase: 81 U/L (ref 39–117)
BUN: 18 mg/dL (ref 6–23)
CO2: 27 mEq/L (ref 19–32)
Calcium: 9.9 mg/dL (ref 8.4–10.5)
Chloride: 101 mEq/L (ref 96–112)
Creatinine, Ser: 0.75 mg/dL (ref 0.40–1.20)
GFR: 79.42 mL/min (ref >60.00)
Glucose, Bld: 83 mg/dL (ref 70–99)
Potassium: 4.3 mEq/L (ref 3.5–5.1)
Sodium: 136 mEq/L (ref 135–145)
Total Bilirubin: 0.6 mg/dL (ref 0.2–1.2)
Total Protein: 7.4 g/dL (ref 6.0–8.3)

## 2020-03-03 LAB — LIPID PANEL
Cholesterol: 119 mg/dL (ref 0–200)
HDL: 50.1 mg/dL (ref >39.00)
LDL Cholesterol: 42 mg/dL (ref 0–99)
NonHDL: 68.5
Total CHOL/HDL Ratio: 2
Triglycerides: 133 mg/dL (ref 0.0–149.0)
VLDL: 26.6 mg/dL (ref 0.0–40.0)

## 2020-03-03 LAB — POCT GLYCOSYLATED HEMOGLOBIN (HGB A1C): Hemoglobin A1C: 5.2 % (ref 4.0–5.6)

## 2020-03-03 LAB — VITAMIN D 25 HYDROXY (VIT D DEFICIENCY, FRACTURES): VITD: 75.59 ng/mL (ref 30.00–100.00)

## 2020-03-03 NOTE — Assessment & Plan Note (Signed)
A1C today of 5.2 which is excellent!!! Commended her on lifestyle changes and encouraged her to continue.   Stop Victoza. Stop Metformin. Will be off all medications as she is doing very well with diet control.  Managed on statin and ACE-I. Foot and eye exams UTD.  Follow up in 6 months.

## 2020-03-03 NOTE — Progress Notes (Signed)
Subjective:    Patient ID: Michelle Figueroa, female    DOB: [birth year], 59 y.o.   MRN: [id number]  HPI  This visit occurred during the SARS-CoV-2 public health emergency.  Safety protocols were in place, including screening questions prior to the visit, additional usage of staff PPE, and extensive cleaning of exam room while observing appropriate contact time as indicated for disinfecting solutions.   Michelle Figueroa is a 59 year old female with a history of hypertension, GERD, type 2 diabetes, hyperlipidemia who presents today for follow up of diabetes.  Current medications include: Victoza 0.6 mg daily, Metformin XR 500 mg (2 tablets). She's not used her Victoza in months.   She is checking her blood glucose a few times weekly and is getting readings of 90's-110's.   Last A1C: 5.7 in February 2021, 5.2 today Last Eye Exam: UTD Last Foot Exam: UTD Pneumonia Vaccination: UTD ACE/ARB: Lisinopril  Statin: Lipitor   BP Readings from Last 3 Encounters:  03/03/20 132/86  01/30/20 140/84  09/04/19 136/86   Wt Readings from Last 3 Encounters:  03/03/20 176 lb 12 oz (80.2 kg)  01/30/20 188 lb 8 oz (85.5 kg)  09/04/19 190 lb 12 oz (86.5 kg)    She started the [employer] one month ago and is doing very well. She's cut out potatoes, bread, and sugar.      Review of Systems  Eyes: Negative for visual disturbance.  Respiratory: Negative for shortness of breath.   Cardiovascular: Negative for chest pain.  Neurological: Negative for dizziness and headaches.       Past Medical History:  Diagnosis Date  . Diabetes mellitus without complication ([employer])    followed by PCP  . Diverticulosis    noted on colonoscopy  . Essential hypertension   . GERD (gastroesophageal reflux disease)   . Hemorrhoids   . Insomnia      Social History   Socioeconomic History  . Marital status: Married    Spouse name: Not on file  . Number of children: Not on file  . Years of education: Not on  file  . Highest education level: Not on file  Occupational History  . Not on file  Tobacco Use  . Smoking status: Never Smoker  . Smokeless tobacco: Never Used  Vaping Use  . Vaping Use: Never used  Substance and Sexual Activity  . Alcohol use: Yes    Alcohol/week: 3.0 standard drinks    Types: 3 Standard drinks or equivalent per week  . Drug use: No  . Sexual activity: Yes    Birth control/protection: Post-menopausal  Other Topics Concern  . Not on file  Social History Narrative   Married.   2 children.   Works in [employer] at [employer].    Enjoys traveling, spending time with family.    Social Determinants of Health   Financial Resource Strain:   . Difficulty of Paying Living Expenses: Not on file  Food Insecurity:   . Worried About Charity fundraiser in the Last Year: Not on file  . Ran Out of Food in the Last Year: Not on file  Transportation Needs:   . Lack of Transportation (Medical): Not on file  . Lack of Transportation (Non-Medical): Not on file  Physical Activity:   . Days of Exercise per Week: Not on file  . Minutes of Exercise per Session: Not on file  Stress:   . Feeling of Stress : Not on file  Social Connections:   .  Frequency of Communication with Friends and Family: Not on file  . Frequency of Social Gatherings with Friends and Family: Not on file  . Attends Religious Services: Not on file  . Active Member of Clubs or Organizations: Not on file  . Attends Archivist Meetings: Not on file  . Marital Status: Not on file  Intimate Partner Violence:   . Fear of Current or Ex-Partner: Not on file  . Emotionally Abused: Not on file  . Physically Abused: Not on file  . Sexually Abused: Not on file    Past Surgical History:  Procedure Laterality Date  . APPENDECTOMY  2009  . CERVICAL FUSION  2010  . CESAREAN SECTION    . CHOLECYSTECTOMY  2000  . ENDOMETRIAL ABLATION    . GYNECOLOGIC CRYOSURGERY    . RETROPUBIC SLING  10/2009     Dr. Davis Gourd at [facility]  . REVISION URINARY SLING  11/19/2009   with cysto due to urinary retension    Family History  Problem Relation Age of Onset  . Arthritis Mother   . Diabetes Mother   . Colon cancer Mother 53  . Lung cancer Father   . Hypertension Father   . Heart attack Father   . Heart attack Brother 74  . Breast cancer Neg Hx     Allergies  Allergen Reactions  . Erythromycin Ethylsuccinate Rash  . Oxycodone-Acetaminophen Nausea Only    Current Outpatient Medications on File Prior to Visit  Medication Sig Dispense Refill  . atorvastatin (LIPITOR) 10 MG tablet TAKE 1 TABLET BY MOUTH DAILY FOR CHOLESTEROL 90 tablet 1  . citalopram (CELEXA) 10 MG tablet TAKE ONE TABLET EVERY DAY FOR ANXIETY 90 tablet 1  . lisinopril (ZESTRIL) 30 MG tablet Take 1 tablet (30 mg total) by mouth daily. For blood pressure. 90 tablet 3  . metFORMIN (GLUCOPHAGE-XR) 500 MG 24 hr tablet TAKE TWO TABLETS BY MOUTH WITH BREAKFAST 180 tablet 1  . Multiple Vitamin (MULTIVITAMIN) tablet Take 1 tablet by mouth daily.    Marland Kitchen nystatin cream (MYCOSTATIN) Apply 1 application topically 2 (two) times daily. 30 g 0  . omeprazole (PRILOSEC) 20 MG capsule Take 1 capsule (20 mg total) by mouth daily. For heartburn. 90 capsule 1  . VITAMIN D PO Take by mouth.     No current facility-administered medications on file prior to visit.    BP 132/86   Pulse 82   Temp (!) 96.1 F (35.6 C) (Temporal)   Ht 5\' 6"  (1.676 m)   Wt 176 lb 12 oz (80.2 kg)   SpO2 98%   BMI 28.53 kg/m    Objective:   Physical Exam Cardiovascular:     Rate and Rhythm: Normal rate and regular rhythm.  Pulmonary:     Effort: Pulmonary effort is normal.     Breath sounds: Normal breath sounds.  Musculoskeletal:     Cervical back: Neck supple.  Skin:    General: Skin is warm and dry.            Assessment & Plan:

## 2020-03-03 NOTE — Patient Instructions (Signed)
Stop by the lab prior to leaving today. I will notify you of your results once received.   Continue to work on [employer], congratulations on your weight loss!!!  Please schedule a follow up appointment in 6 months for your annual physical.   It was a pleasure to see you today!

## 2020-03-03 NOTE — Assessment & Plan Note (Signed)
Improved with weight loss, commended her on these efforts!! Continue lisinopril 30 mg.

## 2020-03-05 ENCOUNTER — Encounter: Payer: Self-pay | Admitting: Medical

## 2020-03-18 ENCOUNTER — Ambulatory Visit
Admission: RE | Admit: 2020-03-18 | Discharge: 2020-03-18 | Disposition: A | Discharge status: Home / Self Care | Payer: BC Managed Care – PPO | Source: Ambulatory Visit | Attending: Primary Care | Admitting: Primary Care

## 2020-03-18 DIAGNOSIS — Z1231 Encounter for screening mammogram for malignant neoplasm of breast: Secondary | ICD-10-CM | POA: Insufficient documentation

## 2020-03-18 NOTE — Progress Notes (Signed)
59 y.o. G3P2 Married White or Caucasian female here for annual exam.  Has a new granddaughter who is 52 months old.  They go every two weeks to see her.  Mother fell and broke her hip.  She's been in rehab and she will be discharged on Monday.    She's lost about 20 pounds.  Feeling really good.  Last HbA1C was normal.  She's off her metformin.    Denies vaginal bleeding.  Has used vaginal estrogen in the past.  Having increased vaginal dryness and discomfort with intercourse.  Would like to restart.  PCP:  Alma Friendly  No LMP recorded. Patient is postmenopausal.          Sexually active: Yes.    The current method of family planning is post menopausal status.    Exercising: Yes.    walking Smoker:  no  Health Maintenance: Pap:  07-26-16 neg HPV HR neg, 01-18-2019 neg History of abnormal Pap:  yes MMG:  03-18-2020 category b density birads 1:neg Colonoscopy:  02-28-2019 f/u 43yrs.   BMD:   none TDaP:  2017 Pneumonia vaccine(s):  2020 Shingrix:   Would like to have it done this year.   Hep C testing: neg 2017 Screening Labs: done in August   reports that she has never smoked. She has never used smokeless tobacco. She reports current alcohol use of about 3.0 standard drinks of alcohol per week. She reports that she does not use drugs.  Past Medical History:  Diagnosis Date  . Diabetes mellitus without complication ([place])    followed by PCP  . Diverticulosis    noted on colonoscopy  . Essential hypertension   . GERD (gastroesophageal reflux disease)   . Hemorrhoids   . Insomnia     Past Surgical History:  Procedure Laterality Date  . APPENDECTOMY  2009  . CERVICAL FUSION  2010  . CESAREAN SECTION    . CHOLECYSTECTOMY  2000  . ENDOMETRIAL ABLATION    . GYNECOLOGIC CRYOSURGERY    . RETROPUBIC SLING  10/2009   Dr. Davis Gourd at [facility]  . REVISION URINARY SLING  11/19/2009   with cysto due to urinary retension    Current Outpatient Medications  Medication Sig  Dispense Refill  . atorvastatin (LIPITOR) 10 MG tablet TAKE 1 TABLET BY MOUTH DAILY FOR CHOLESTEROL 90 tablet 1  . citalopram (CELEXA) 10 MG tablet TAKE ONE TABLET EVERY DAY FOR ANXIETY 90 tablet 1  . lisinopril (ZESTRIL) 30 MG tablet Take 1 tablet (30 mg total) by mouth daily. For blood pressure. 90 tablet 3  . Multiple Vitamin (MULTIVITAMIN) tablet Take 1 tablet by mouth daily.    Marland Kitchen omeprazole (PRILOSEC) 20 MG capsule Take 1 capsule (20 mg total) by mouth daily. For heartburn. 90 capsule 1  . VITAMIN D PO Take by mouth.     No current facility-administered medications for this visit.    Family History  Problem Relation Age of Onset  . Arthritis Mother   . Diabetes Mother   . Colon cancer Mother 90  . Lung cancer Father   . Hypertension Father   . Heart attack Father   . Heart attack Brother 89  . Breast cancer Neg Hx     Review of Systems  Constitutional: Negative.   HENT: Negative.   Eyes: Negative.   Respiratory: Negative.   Cardiovascular: Negative.   Gastrointestinal: Negative.   Endocrine: Negative.   Genitourinary: Negative.   Musculoskeletal: Negative.   Skin: Negative.  Allergic/Immunologic: Negative.   Neurological: Negative.   Hematological: Negative.   Psychiatric/Behavioral: Negative.     Exam:   BP 110/70   Pulse 68   Resp 16   Ht 5\' 5"  (1.651 m)   Wt 177 lb (80.3 kg)   BMI 29.45 kg/m   Height: 5\' 5"  (165.1 cm)  General appearance: alert, cooperative and appears stated age Head: Normocephalic, without obvious abnormality, atraumatic Neck: no adenopathy, supple, symmetrical, trachea midline and thyroid normal to inspection and palpation Lungs: clear to auscultation bilaterally Breasts: normal appearance, no masses or tenderness Heart: regular rate and rhythm Abdomen: soft, non-tender; bowel sounds normal; no masses,  no organomegaly Extremities: extremities normal, atraumatic, no cyanosis or edema Skin: Skin color, texture, turgor normal. No  rashes or lesions Lymph nodes: Cervical, supraclavicular, and axillary nodes normal. No abnormal inguinal nodes palpated Neurologic: Grossly normal   Pelvic: External genitalia:  no lesions              Urethra:  normal appearing urethra with no masses, tenderness or lesions              Bartholins and Skenes: normal                 Vagina: normal appearing vagina with normal color and discharge, no lesions              Cervix: no lesions              Pap taken: No. Bimanual Exam:  Uterus:  normal size, contour, position, consistency, mobility, non-tender              Adnexa: normal adnexa and no mass, fullness, tenderness               Rectovaginal: Confirms               Anus:  normal sphincter tone, no lesions  Chaperone, Terence Lux, CMA, was present for exam.  A:  Well Woman with normal exam Family hx of CVD (bother with MI at age 2) Diabetes with normal hbA1c with weight loss Hypertension Elevated liver enzymes (h/o negative liver biopsy) that are improved with weight loss  P:   Mammogram guidelines reviewed pap smear neg 2020, neg HR HPV 2018.  Not indicated today. Colonoscopy done 2020.  Has multiple polyps.  Follow up 3 years. Lab work done with Alma Friendly Consider BMD around age 19 Vaccine UTD RF for estrace vaginal cream 1 gram pv twice weekly.  #42.5g/3RF. Return annually or prn

## 2020-03-21 ENCOUNTER — Ambulatory Visit: Payer: BC Managed Care – PPO | Admitting: Obstetrics & Gynecology

## 2020-03-21 ENCOUNTER — Encounter: Payer: Self-pay | Admitting: Obstetrics & Gynecology

## 2020-03-21 ENCOUNTER — Other Ambulatory Visit: Payer: Self-pay

## 2020-03-21 VITALS — BP 110/70 | HR 68 | Resp 16 | Ht 65.0 in | Wt 177.0 lb

## 2020-03-21 DIAGNOSIS — Z01419 Encounter for gynecological examination (general) (routine) without abnormal findings: Secondary | ICD-10-CM

## 2020-03-21 MED ORDER — ESTRADIOL 0.1 MG/GM VA CREA: TOPICAL_CREAM | VAGINAL | 3 refills | Status: DC

## 2020-04-01 ENCOUNTER — Ambulatory Visit: Payer: BC Managed Care – PPO | Admitting: Cardiology

## 2020-04-02 ENCOUNTER — Ambulatory Visit: Payer: BC Managed Care – PPO | Admitting: Dermatology

## 2020-04-02 ENCOUNTER — Other Ambulatory Visit: Payer: Self-pay

## 2020-04-02 DIAGNOSIS — L821 Other seborrheic keratosis: Secondary | ICD-10-CM | POA: Diagnosis not present

## 2020-04-02 DIAGNOSIS — L820 Inflamed seborrheic keratosis: Secondary | ICD-10-CM

## 2020-04-02 DIAGNOSIS — L738 Other specified follicular disorders: Secondary | ICD-10-CM

## 2020-04-02 DIAGNOSIS — L710 Perioral dermatitis: Secondary | ICD-10-CM | POA: Diagnosis not present

## 2020-04-02 MED ORDER — DAPSONE 5 % EX GEL: 1.0000 "application " | CUTANEOUS | 1 refills | Status: DC

## 2020-04-02 NOTE — Progress Notes (Signed)
   Follow-Up Visit   Subjective  Michelle Figueroa is a 59 y.o. female who presents for the following: check growth (R neck x 23m, painful and hx of bleeding) and check growth (L lower eyelid, ~66m). Pt declines general mole check and  skin cancer screening today.  The following portions of the chart were reviewed this encounter and updated as appropriate:  Tobacco  Allergies  Meds  Problems  Med Hx  Surg Hx  Fam Hx     Review of Systems:  No other skin or systemic complaints except as noted in HPI or Assessment and Plan.  Objective  Well appearing patient in no apparent distress; mood and affect are within normal limits.  A focused examination was performed including face, neck. Relevant physical exam findings are noted in the Assessment and Plan.  Objective  L medial cheek x 2 (2): Small yellow papules with a central dell.   Objective  R neck: Erythematous keratotic or waxy stuck-on papule or plaque.   Objective  perioral: Pink paps perioral  Objective  Left Lower Eyelid : Small stuck on waxy pap   Assessment & Plan  Sebaceous hyperplasia (2) L medial cheek x 2  Benign, discussed treatment options observation vs ED  Discussed ED $60 for first lesion and $15 for each additional treated on same day  ED x 2 today  Destruction of lesion - L medial cheek x 2 Complexity: simple   Destruction method comment:  Electrodesiccation Informed consent: discussed and consent obtained   Timeout:  patient name, date of birth, surgical site, and procedure verified Patient was prepped and draped in usual sterile fashion: patient was prepped with isopropyl alcohol. Outcome: patient tolerated procedure well with no complications    Inflamed seborrheic keratosis R neck  Destruction of lesion - R neck Complexity: simple   Destruction method: cryotherapy   Informed consent: discussed and consent obtained   Timeout:  patient name, date of birth, surgical site, and procedure  verified Lesion destroyed using liquid nitrogen: Yes   Region frozen until ice ball extended beyond lesion: Yes   Outcome: patient tolerated procedure well with no complications   Post-procedure details: wound care instructions given    Perioral dermatitis perioral Start Aczone 5% qd/bid  Discussed oral doxycycline 50mg , pt would rather start with topicals  Discussed if Aczone not covered can send in Triple cream to [facility] Dapsone (ACZONE) 5 % topical gel - perioral  Seborrheic keratosis Left Lower Eyelid  Benign, observe Discussed treating with Ln2 if gets larger  Return if symptoms worsen or fail to improve.  I, Othelia Pulling, RMA, am acting as scribe for Sarina Ser, MD .  Documentation: I have reviewed the above documentation for accuracy and completeness, and I agree with the above.  Sarina Ser, MD

## 2020-04-03 ENCOUNTER — Encounter: Payer: Self-pay | Admitting: Dermatology

## 2020-04-03 DIAGNOSIS — M189 Osteoarthritis of first carpometacarpal joint, unspecified: Secondary | ICD-10-CM | POA: Diagnosis not present

## 2020-04-08 ENCOUNTER — Other Ambulatory Visit: Payer: Self-pay | Admitting: Primary Care

## 2020-04-08 DIAGNOSIS — F411 Generalized anxiety disorder: Secondary | ICD-10-CM

## 2020-04-09 LAB — HM DIABETES EYE EXAM

## 2020-04-17 ENCOUNTER — Telehealth: Payer: Self-pay

## 2020-04-17 DIAGNOSIS — L738 Other specified follicular disorders: Secondary | ICD-10-CM

## 2020-04-17 MED ORDER — DOXYCYCLINE HYCLATE 50 MG PO CAPS: ORAL_CAPSULE | ORAL | 4 refills | Status: DC

## 2020-04-17 NOTE — Addendum Note (Signed)
Addended by: Harriett Sine on: 04/17/2020 10:56 AM   Modules accepted: Orders

## 2020-04-17 NOTE — Telephone Encounter (Signed)
Pt feels that the Aczone is not helping. She would like to try the Doxycycline as discussed at visit on 04/02/20. OK to send Doxycycline 50 mg to pharmacy?

## 2020-04-17 NOTE — Telephone Encounter (Signed)
Yes.  Send doxycycline 50 mg 1 po qd in pm with evening meal (with food) disp #30 with 4 rf If she does not have an appt, make one for 2-4 mos

## 2020-04-17 NOTE — Telephone Encounter (Signed)
Rx sent to pharmacy. Pt needs to check schedule and will call back to make appt.

## 2020-04-23 ENCOUNTER — Ambulatory Visit: Payer: BC Managed Care – PPO | Admitting: Cardiology

## 2020-05-14 ENCOUNTER — Other Ambulatory Visit: Payer: Self-pay

## 2020-05-14 ENCOUNTER — Encounter: Payer: Self-pay | Admitting: Cardiology

## 2020-05-14 ENCOUNTER — Ambulatory Visit: Payer: BC Managed Care – PPO | Admitting: Cardiology

## 2020-05-14 VITALS — BP 124/80 | HR 64 | Ht 66.0 in | Wt 178.0 lb

## 2020-05-14 DIAGNOSIS — Z7182 Exercise counseling: Secondary | ICD-10-CM | POA: Diagnosis not present

## 2020-05-14 DIAGNOSIS — I2510 Atherosclerotic heart disease of native coronary artery without angina pectoris: Secondary | ICD-10-CM

## 2020-05-14 DIAGNOSIS — I10 Essential (primary) hypertension: Secondary | ICD-10-CM

## 2020-05-14 DIAGNOSIS — Z713 Dietary counseling and surveillance: Secondary | ICD-10-CM | POA: Diagnosis not present

## 2020-05-14 DIAGNOSIS — Z8249 Family history of ischemic heart disease and other diseases of the circulatory system: Secondary | ICD-10-CM

## 2020-05-14 DIAGNOSIS — Z7189 Other specified counseling: Secondary | ICD-10-CM

## 2020-05-14 NOTE — Patient Instructions (Signed)
Medication Instructions:  Your Physician recommend you continue on your current medication as directed.    *If you need a refill on your cardiac medications before your next appointment, please call your pharmacy*   Lab Work: None   Testing/Procedures: None   Follow-Up: At [facility], you and your health needs are our priority.  As part of our continuing mission to provide you with exceptional heart care, we have created designated Provider Care Teams.  These Care Teams include your primary Cardiologist (physician) and Advanced Practice Providers (APPs -  Physician Assistants and Nurse Practitioners) who all work together to provide you with the care you need, when you need it.  We recommend signing up for the patient portal called "MyChart".  Sign up information is provided on this After Visit Summary.  MyChart is used to connect with patients for Virtual Visits (Telemedicine).  Patients are able to view lab/test results, encounter notes, upcoming appointments, etc.  Non-urgent messages can be sent to your provider as well.   To learn more about what you can do with MyChart, go to [email or web address].    Your next appointment:   2 year(s)  The format for your next appointment:   In Person  Provider:   Bridgette Christopher, MD     

## 2020-05-14 NOTE — Progress Notes (Signed)
Cardiology Office Note:    Date:  05/14/2020   ID:  [name], DOB 1961-01-02, MRN [id number]  PCP:  Pleas Koch, NP  Cardiologist:  Buford Dresser, MD PhD  Referring MD: Pleas Koch, NP   CC: follow up  History of Present Illness:    Michelle Figueroa is a 59 y.o. female with a hx of HTN, HLD, obesity, type II diabetes who is seen for follow up today. I initially met her 03/20/2019 as a new consult at the request of Pleas Koch, NP for the evaluation and management of CV risk/family history of CV disease.  CV history: Her brother and father both had Mis in their 104s. She has hypertension, hyperlipidemia, diabetes. Calcium score 04/2019 was 21 (84th percentile).  Family history: brother had MI in 19s, father had MI in 33s as well. Both were smokers, father died of lung cancer at age 80. Father had triglyceride problem, brother had cholesterol problem. Possibly grandmother had a stroke, unsure. Both grandmothers lived to old age, one age 80 and one age 31.  Today: She is now off diabetes medication, has lost >20 lbs since our last visit. Did [employer] program to get jumpstarted. Cut out processed foods (especially white flour, luncheon meats). Walks with her job, minimal intentional exercise but does park a far distance from her office.   Reviewed labs from 03/03/20, lipids are excellent. Tolerating atorvastatin well, no issues. Discussed that with her calcium on CT, would continue atorvastatin if she is tolerating. Got Covid booster, had more reactions with the third shot (no issues with the first two doses).   Doesn't check BP at home. Has a home cuff but doesn't check routinely. Discussed with weight loss, may be able to cut back on lisinopril. She will monitor for symptoms and contact me.  Doesn't feel puffy any more. Drinks more water, less preservatives.   Denies chest pain, shortness of breath at rest or with normal exertion. No PND, orthopnea, LE  edema or unexpected weight gain. No syncope or palpitations.   Past Medical History:  Diagnosis Date  . Diabetes mellitus without complication ([place])    followed by PCP  . Diverticulosis    noted on colonoscopy  . Essential hypertension   . GERD (gastroesophageal reflux disease)   . Hemorrhoids   . Insomnia     Past Surgical History:  Procedure Laterality Date  . APPENDECTOMY  2009  . CERVICAL FUSION  2010  . CESAREAN SECTION    . CHOLECYSTECTOMY  2000  . ENDOMETRIAL ABLATION    . GYNECOLOGIC CRYOSURGERY    . RETROPUBIC SLING  10/2009   Dr. Davis Gourd [facility]  . REVISION URINARY SLING  11/19/2009   with cysto due to urinary retension    Current Medications: Current Outpatient Medications on File Prior to Visit  Medication Sig  . atorvastatin (LIPITOR) 10 MG tablet TAKE 1 TABLET BY MOUTH DAILY FOR CHOLESTEROL  . citalopram (CELEXA) 10 MG tablet TAKE ONE TABLET EVERY DAY FOR ANXIETY  . Dapsone (ACZONE) 5 % topical gel Apply 1 application topically as directed. Qd to bid aa rash around mouth  . estradiol (ESTRACE VAGINAL) 0.1 MG/GM vaginal cream 1 gram pv twice weekly  . lisinopril (ZESTRIL) 30 MG tablet Take 1 tablet (30 mg total) by mouth daily. For blood pressure.  . Multiple Vitamin (MULTIVITAMIN) tablet Take 1 tablet by mouth daily.  Marland Kitchen nystatin cream (MYCOSTATIN) nystatin 100,000 unit/gram topical cream  . omeprazole (PRILOSEC) 20  MG capsule Take 1 capsule (20 mg total) by mouth daily. For heartburn.  Marland Kitchen VITAMIN D PO Take by mouth.   No current facility-administered medications on file prior to visit.     Allergies:   Erythromycin ethylsuccinate and Oxycodone-acetaminophen   Social History   Tobacco Use  . Smoking status: Never Smoker  . Smokeless tobacco: Never Used  Vaping Use  . Vaping Use: Never used  Substance Use Topics  . Alcohol use: Yes    Alcohol/week: 3.0 standard drinks    Types: 3 Standard drinks or equivalent per week  . Drug use: No      Family History: The patient's family history includes Arthritis in her mother; Colon cancer (age of onset: 65) in her mother; Diabetes in her mother; Heart attack in her father; Heart attack (age of onset: 90) in her brother; Hypertension in her father; Lung cancer in her father. There is no history of Breast cancer.  ROS:   Please see the history of present illness.  Additional pertinent ROS otherwise unremarkable.    EKGs/Labs/Other Studies Reviewed:    The following studies were reviewed today: Prior PCP notes. No prior cardiac studies.   EKG:  EKG is personally reviewed.  The ekg ordered today demonstrates NSR at 64 bpm  Recent Labs: 05/31/2019: TSH 3.25 09/04/2019: Hemoglobin 14.5; Platelets 164.0 03/03/2020: ALT 47; BUN 18; Creatinine, Ser 0.75; Potassium 4.3; Sodium 136  Recent Lipid Panel    Component Value Date/Time   CHOL 119 03/03/2020 1212   CHOL 163 08/08/2018 0811   TRIG 133.0 03/03/2020 1212   HDL 50.10 03/03/2020 1212   HDL 59 08/08/2018 0811   CHOLHDL 2 03/03/2020 1212   VLDL 26.6 03/03/2020 1212   LDLCALC 42 03/03/2020 1212   LDLCALC 76 08/08/2018 0811   LDLDIRECT 75.0 09/04/2019 1154    Physical Exam:    VS:  BP 124/80   Pulse 64   Ht '5\' 6"'  (1.676 m)   Wt 178 lb (80.7 kg)   SpO2 96%   BMI 28.73 kg/m     Wt Readings from Last 3 Encounters:  05/14/20 178 lb (80.7 kg)  03/21/20 177 lb (80.3 kg)  03/03/20 176 lb 12 oz (80.2 kg)    GEN: Well nourished, well developed in no acute distress HEENT: Normal, moist mucous membranes NECK: No JVD CARDIAC: regular rhythm, normal S1 and S2, no rubs or gallops. No murmur. VASCULAR: Radial and DP pulses 2+ bilaterally. No carotid bruits RESPIRATORY:  Clear to auscultation without rales, wheezing or rhonchi  ABDOMEN: Soft, non-tender, non-distended MUSCULOSKELETAL:  Ambulates independently SKIN: Warm and dry, no edema NEUROLOGIC:  Alert and oriented x 3. No focal neuro deficits noted. PSYCHIATRIC:  Normal  affect    ASSESSMENT:    1. Coronary artery calcification seen on CT scan   2. Family history of cardiovascular disease   3. Nutritional counseling   4. Exercise counseling   5. Family history of early CAD   10. Cardiac risk counseling   7. Counseling on health promotion and disease prevention   8. Essential hypertension    PLAN:    Family history of premature CAD Coronary calcium seen on CT scan: -continue atorvastatin given her family history and calcium on CT, tolerating well with excellent LDL  Type II diabetes Prior obesity -now diet controlled with weight loss, last A1c 5.2 -BMI 28, no longer in obese category -if future medications needed, would consider SGLT2i or GLP1RA given coronary calcium  Hypertension: -at goal  of <130/80 today -with continued weight loss, may be able to cut back on lisinopril in the future  Cardiac risk counseling and prevention recommendations: discussed diet and nutrition recommendations today -recommend heart healthy/Mediterranean diet, with whole grains, fruits, vegetable, fish, lean meats, nuts, and olive oil. Limit salt. -recommend moderate walking, 3-5 times/week for 30-50 minutes each session. Aim for at least 150 minutes.week. Goal should be pace of 3 miles/hours, or walking 1.5 miles in 30 minutes -recommend avoidance of tobacco products. Avoid excess alcohol. -ASCVD risk score: The ASCVD Risk score Mikey Bussing DC Jr., et al., 2013) failed to calculate for the following reasons:   The valid total cholesterol range is 130 to 320 mg/dL    Plan for follow up: 2 years or sooner PRN  Medication Adjustments/Labs and Tests Ordered: Current medicines are reviewed at length with the patient today.  Concerns regarding medicines are outlined above.  Orders Placed This Encounter  Procedures  . EKG 12-Lead   No orders of the defined types were placed in this encounter.   Patient Instructions  Medication Instructions:  Your Physician recommend you  continue on your current medication as directed.    *If you need a refill on your cardiac medications before your next appointment, please call your pharmacy*   Lab Work: None   Testing/Procedures: None   Follow-Up: At [facility], you and your health needs are our priority.  As part of our continuing mission to provide you with exceptional heart care, we have created designated Provider Care Teams.  These Care Teams include your primary Cardiologist (physician) and Advanced Practice Providers (APPs -  Physician Assistants and Nurse Practitioners) who all work together to provide you with the care you need, when you need it.  We recommend signing up for the patient portal called "MyChart".  Sign up information is provided on this After Visit Summary.  MyChart is used to connect with patients for Virtual Visits (Telemedicine).  Patients are able to view lab/test results, encounter notes, upcoming appointments, etc.  Non-urgent messages can be sent to your provider as well.   To learn more about what you can do with MyChart, go to [email or web address].    Your next appointment:   2 year(s)  The format for your next appointment:   In Person  Provider:   Buford Dresser, MD      Signed, Buford Dresser, MD PhD 05/14/2020 9:38 AM    [place]

## 2020-06-13 ENCOUNTER — Other Ambulatory Visit: Payer: Self-pay | Admitting: Primary Care

## 2020-06-13 DIAGNOSIS — I10 Essential (primary) hypertension: Secondary | ICD-10-CM

## 2020-06-20 ENCOUNTER — Encounter: Payer: Self-pay | Admitting: Nurse Practitioner

## 2020-06-20 ENCOUNTER — Other Ambulatory Visit: Payer: Self-pay

## 2020-06-20 ENCOUNTER — Ambulatory Visit: Payer: BC Managed Care – PPO | Admitting: Nurse Practitioner

## 2020-06-20 VITALS — BP 152/96 | HR 93 | Temp 98.3°F | Ht 65.0 in | Wt 179.8 lb

## 2020-06-20 DIAGNOSIS — J029 Acute pharyngitis, unspecified: Secondary | ICD-10-CM

## 2020-06-20 LAB — POCT RAPID STREP A (OFFICE): Rapid Strep A Screen: NEGATIVE

## 2020-06-20 NOTE — Patient Instructions (Signed)
Upper Respiratory Infection, Adult An upper respiratory infection (URI) is a common viral infection of the nose, throat, and upper air passages that lead to the lungs. The most common type of URI is the common cold. URIs usually get better on their own, without medical treatment. What are the causes? A URI is caused by a virus. You may catch a virus by:  Breathing in droplets from an infected person's cough or sneeze.  Touching something that has been exposed to the virus (contaminated) and then touching your mouth, nose, or eyes.   What are the signs or symptoms? A URI usually involves some of the following symptoms:  Runny or stuffy (congested) nose.  Sneezing.  Cough.  Sore throat.  Headache.  Fatigue.  Fever.  Loss of appetite.  Pain in your forehead, behind your eyes, and over your cheekbones (sinus pain).  Muscle aches.  Redness or irritation of the eyes.  Pressure in the ears or face. How is this diagnosed? This condition may be diagnosed based on your medical history and symptoms, and a physical exam. Your health care provider may use a cotton swab to take a mucus sample from your nose (nasal swab). This sample can be tested to determine what virus is causing the illness. How is this treated? URIs usually get better on their own within 7-10 days. You can take steps at home to relieve your symptoms. Medicines cannot cure URIs, but your health care provider may recommend certain medicines to help relieve symptoms, such as:  Over-the-counter cold medicines.  Cough suppressants. Coughing is a type of defense against infection that helps to clear the respiratory system, so take these medicines only as recommended by your health care provider.  Fever-reducing medicines. Follow these instructions at home: Activity  Rest as needed.  If you have a fever, stay home from work or school until your fever is gone or until your health care provider says you are no longer  contagious. Your health care provider may have you wear a face mask to prevent your infection from spreading. Relieving symptoms  Gargle with a salt-water mixture 3-4 times a day or as needed. To make a salt-water mixture, completely dissolve -1 tsp of salt in 1 cup of warm water.  Use a cool-mist humidifier to add moisture to the air. This can help you breathe more easily. Eating and drinking   Drink enough fluid to keep your urine pale yellow.  Eat soups and other clear broths. General instructions   Take over-the-counter and prescription medicines only as told by your health care provider. These include cold medicines, fever reducers, and cough suppressants.  Do not use any products that contain nicotine or tobacco, such as cigarettes and e-cigarettes. If you need help quitting, ask your health care provider.  Stay away from secondhand smoke.  Stay up to date on all immunizations, including the yearly (annual) flu vaccine.  Keep all follow-up visits as told by your health care provider. This is important. How to prevent the spread of infection to others   URIs can be passed from person to person (are contagious). To prevent the infection from spreading: ? Wash your hands often with soap and water. If soap and water are not available, use hand sanitizer. ? Avoid touching your mouth, face, eyes, or nose. ? Cough or sneeze into a tissue or your sleeve or elbow instead of into your hand or into the air. Contact a health care provider if:  You are getting worse instead  of better.  You have a fever or chills.  Your mucus is brown or red.  You have yellow or brown discharge coming from your nose.  You have pain in your face, especially when you bend forward.  You have swollen neck glands.  You have pain while swallowing.  You have white areas in the back of your throat. Get help right away if:  You have shortness of breath that gets worse.  You have severe or  persistent: ? Headache. ? Ear pain. ? Sinus pain. ? Chest pain.  You have chronic lung disease along with any of the following: ? Wheezing. ? Prolonged cough. ? Coughing up blood. ? A change in your usual mucus.  You have a stiff neck.  You have changes in your: ? Vision. ? Hearing. ? Thinking. ? Mood. Summary  An upper respiratory infection (URI) is a common infection of the nose, throat, and upper air passages that lead to the lungs.  A URI is caused by a virus.  URIs usually get better on their own within 7-10 days.  Medicines cannot cure URIs, but your health care provider may recommend certain medicines to help relieve symptoms. This information is not intended to replace advice given to you by your health care provider. Make sure you discuss any questions you have with your health care provider. Document Revised: 07/06/2018 Document Reviewed: 02/11/2017 Elsevier Patient Education  [place].

## 2020-06-20 NOTE — Progress Notes (Signed)
   Subjective:    Patient ID: Michelle Figueroa, female    DOB: [birth year], 59 y.o.   MRN: [id number]  HPI  59 year old female presents today with sore throat and nasal congestion. Went on campus for COVID-19 rapid testing yesterday when symptoms started and was negative.   Here today with concerns of strep throat without known sick contact.   Review of Systems  Constitutional: Positive for fatigue.  HENT: Positive for congestion, rhinorrhea and sore throat.   Eyes: Negative.   Respiratory: Negative.   Cardiovascular: Negative.   Neurological: Negative.   Psychiatric/Behavioral: Negative.      Past Medical History:  Diagnosis Date  . Diabetes mellitus without complication ([place])    followed by PCP  . Diverticulosis    noted on colonoscopy  . Essential hypertension   . GERD (gastroesophageal reflux disease)   . Hemorrhoids   . Insomnia    Today's Vitals   06/20/20 0829  BP: (!) 152/96  Pulse: 93  Temp: 98.3 F (36.8 C)  TempSrc: Oral  SpO2: 95%  Weight: 179 lb 12.8 oz (81.6 kg)  Height: 5\' 5"  (1.651 m)   BP recheck manual 150/88 left arm adult cuff  Body mass index is 29.92 kg/m.    Objective:   Physical Exam HENT:     Head: Normocephalic.     Right Ear: Tympanic membrane and ear canal normal.     Left Ear: Tympanic membrane and ear canal normal.     Nose: Rhinorrhea present.     Mouth/Throat:     Pharynx: Posterior oropharyngeal erythema present. No pharyngeal swelling or oropharyngeal exudate.  Cardiovascular:     Rate and Rhythm: Normal rate and regular rhythm.     Heart sounds: Normal heart sounds.  Pulmonary:     Effort: Pulmonary effort is normal.     Breath sounds: Normal breath sounds.  Musculoskeletal:     Cervical back: Neck supple.  Skin:    General: Skin is warm and dry.  Neurological:     Mental Status: She is alert and oriented to person, place, and time.  Psychiatric:        Mood and Affect: Mood normal.       Recent Results (from  the past 2160 hour(s))  POCT rapid strep A     Status: Normal   Collection Time: 06/20/20  8:41 AM  Result Value Ref Range   Rapid Strep A Screen Negative Negative      Assessment & Plan:  History and exam consistent with viral URI, advised OTC coricidin, vicks vapor rub, saline flush for sinus, honey/cough drop to sooth throat.   RTC with any new or worsening symptoms, if sinus congestion persists for > a week or with new onset fever.

## 2020-06-26 ENCOUNTER — Other Ambulatory Visit: Payer: Self-pay

## 2020-06-26 DIAGNOSIS — K219 Gastro-esophageal reflux disease without esophagitis: Secondary | ICD-10-CM

## 2020-06-26 MED ORDER — OMEPRAZOLE 20 MG PO CPDR: 20.0000 mg | DELAYED_RELEASE_CAPSULE | Freq: Every day | ORAL | 1 refills | Status: DC

## 2020-07-16 ENCOUNTER — Other Ambulatory Visit: Payer: Self-pay | Admitting: Primary Care

## 2020-07-16 DIAGNOSIS — E781 Pure hyperglyceridemia: Secondary | ICD-10-CM

## 2020-07-29 DIAGNOSIS — M189 Osteoarthritis of first carpometacarpal joint, unspecified: Secondary | ICD-10-CM | POA: Diagnosis not present

## 2020-08-19 ENCOUNTER — Other Ambulatory Visit: Payer: Self-pay | Admitting: Primary Care

## 2020-08-19 ENCOUNTER — Telehealth: Payer: Self-pay

## 2020-08-19 NOTE — Telephone Encounter (Signed)
Noted, she certainly needs evaluation for symptoms. I also have an appointment with her scheduled for 2 weeks, can meet sooner if needed.

## 2020-08-19 NOTE — Telephone Encounter (Signed)
Pt left v/m that pt started with fever and nausea on 08/16/20; fever has continued and watery diarrhea started today.  fever today is 99. Pt is taking ibuprofen for fever; pt having chills, terrible h/a; upper dull pain in back that is continuous since 08/16/20; pt is having generalized all over aching. No cough or SOB and no CP. No vomiting; no loss of taste or smell. No S/T,runny nose or sinus or head congestion. Pt has had mid upper dull abd pain on and off that started 08/18/20. Pt is going to [facility] [address]; pt said her mom is in nursing facility and she had similar symptoms few days ago. Sending note to Gentry Fitz NP.

## 2020-08-27 ENCOUNTER — Other Ambulatory Visit: Payer: Self-pay

## 2020-08-27 ENCOUNTER — Other Ambulatory Visit: Payer: Self-pay | Admitting: Primary Care

## 2020-08-27 ENCOUNTER — Other Ambulatory Visit (INDEPENDENT_AMBULATORY_CARE_PROVIDER_SITE_OTHER): Payer: BC Managed Care – PPO

## 2020-08-27 DIAGNOSIS — E559 Vitamin D deficiency, unspecified: Secondary | ICD-10-CM

## 2020-08-27 DIAGNOSIS — E119 Type 2 diabetes mellitus without complications: Secondary | ICD-10-CM

## 2020-08-27 DIAGNOSIS — E7800 Pure hypercholesterolemia, unspecified: Secondary | ICD-10-CM

## 2020-08-27 LAB — VITAMIN D 25 HYDROXY (VIT D DEFICIENCY, FRACTURES): VITD: 63.25 ng/mL (ref 30.00–100.00)

## 2020-08-27 LAB — LIPID PANEL
Cholesterol: 185 mg/dL (ref 0–200)
HDL: 52 mg/dL (ref >39.00)
LDL Cholesterol: 93 mg/dL (ref 0–99)
NonHDL: 133.06
Total CHOL/HDL Ratio: 4
Triglycerides: 200 mg/dL — ABNORMAL HIGH (ref 0.0–149.0)
VLDL: 40 mg/dL (ref 0.0–40.0)

## 2020-08-27 LAB — CBC
HCT: 45 % (ref 36.0–46.0)
Hemoglobin: 15.1 g/dL — ABNORMAL HIGH (ref 12.0–15.0)
MCHC: 33.5 g/dL (ref 30.0–36.0)
MCV: 88.8 fl (ref 78.0–100.0)
Platelets: 242 10*3/uL (ref 150.0–400.0)
RBC: 5.07 Mil/uL (ref 3.87–5.11)
RDW: 13.5 % (ref 11.5–15.5)
WBC: 4.7 10*3/uL (ref 4.0–10.5)

## 2020-08-27 LAB — COMPREHENSIVE METABOLIC PANEL
ALT: 52 U/L — ABNORMAL HIGH (ref 0–35)
AST: 48 U/L — ABNORMAL HIGH (ref 0–37)
Albumin: 4.2 g/dL (ref 3.5–5.2)
Alkaline Phosphatase: 128 U/L — ABNORMAL HIGH (ref 39–117)
BUN: 15 mg/dL (ref 6–23)
CO2: 31 mEq/L (ref 19–32)
Calcium: 10 mg/dL (ref 8.4–10.5)
Chloride: 104 mEq/L (ref 96–112)
Creatinine, Ser: 0.88 mg/dL (ref 0.40–1.20)
GFR: 72.5 mL/min (ref >60.00)
Glucose, Bld: 126 mg/dL — ABNORMAL HIGH (ref 70–99)
Potassium: 4.7 mEq/L (ref 3.5–5.1)
Sodium: 141 mEq/L (ref 135–145)
Total Bilirubin: 0.6 mg/dL (ref 0.2–1.2)
Total Protein: 7 g/dL (ref 6.0–8.3)

## 2020-08-27 LAB — HEMOGLOBIN A1C: Hgb A1c MFr Bld: 5.6 % (ref 4.6–6.5)

## 2020-09-03 ENCOUNTER — Encounter: Payer: Self-pay | Admitting: Primary Care

## 2020-09-03 ENCOUNTER — Ambulatory Visit (INDEPENDENT_AMBULATORY_CARE_PROVIDER_SITE_OTHER): Payer: BC Managed Care – PPO | Admitting: Primary Care

## 2020-09-03 ENCOUNTER — Other Ambulatory Visit: Payer: Self-pay

## 2020-09-03 VITALS — BP 126/64 | HR 94 | Temp 97.6°F | Ht 65.0 in | Wt 183.0 lb

## 2020-09-03 DIAGNOSIS — I10 Essential (primary) hypertension: Secondary | ICD-10-CM | POA: Diagnosis not present

## 2020-09-03 DIAGNOSIS — E119 Type 2 diabetes mellitus without complications: Secondary | ICD-10-CM | POA: Diagnosis not present

## 2020-09-03 DIAGNOSIS — K219 Gastro-esophageal reflux disease without esophagitis: Secondary | ICD-10-CM

## 2020-09-03 DIAGNOSIS — E7800 Pure hypercholesterolemia, unspecified: Secondary | ICD-10-CM

## 2020-09-03 DIAGNOSIS — E559 Vitamin D deficiency, unspecified: Secondary | ICD-10-CM

## 2020-09-03 DIAGNOSIS — F411 Generalized anxiety disorder: Secondary | ICD-10-CM

## 2020-09-03 DIAGNOSIS — Z0000 Encounter for general adult medical examination without abnormal findings: Secondary | ICD-10-CM

## 2020-09-03 MED ORDER — SERTRALINE HCL 50 MG PO TABS: 50.0000 mg | ORAL_TABLET | Freq: Every day | ORAL | 0 refills | Status: DC

## 2020-09-03 NOTE — Assessment & Plan Note (Signed)
Some improvement on citalopram 10 mg, does experience breakthrough symptoms, also with moderate decrease in libido.  Discussed options including adding Wellbutrin vs changing to Zoloft, she would like to change to Zoloft.  Rx for Zoloft 50 mg sent to pharmacy. She will update.

## 2020-09-03 NOTE — Assessment & Plan Note (Signed)
Resolved and well controlled off medications. A1C today of 5.6!!  Continue to monitor. Repeat lab in 6 months.

## 2020-09-03 NOTE — Assessment & Plan Note (Signed)
Compliant to vitamin D3 daily, continue same.

## 2020-09-03 NOTE — Assessment & Plan Note (Signed)
Well controlled in the office today, continue lisinopril 30 mg. CMP reviewed.

## 2020-09-03 NOTE — Assessment & Plan Note (Signed)
Compliant to atorvastatin 10 mg, continue same. Recent lipid panel reviewed.  LFT's chronically elevated, has undergone ultrasound and liver biopsy.   Continue atorvastatin 10 mg.

## 2020-09-03 NOTE — Progress Notes (Signed)
Subjective:    Patient ID: Michelle Figueroa, female    DOB: Feb 07, 1961, 60 y.o.   MRN: [id number]  HPI  This visit occurred during the SARS-CoV-2 public health emergency.  Safety protocols were in place, including screening questions prior to the visit, additional usage of staff PPE, and extensive cleaning of exam room while observing appropriate contact time as indicated for disinfecting solutions.   Michelle Figueroa is a 60 year old female who presents today for complete physical.  Immunizations: -Tetanus: 2017 -Influenza: Completed this season  -Pneumonia: 2020 -Shingles: Never completed -Covid-19: Completed three vaccines  Diet: She endorses a fair diet.  Exercise: She is walking some  Eye exam: Completed in August 2021 Dental exam: Completes semi-annually  Pap Smear: Follows with GYN Mammogram: September 2021 Colonoscopy: 2020, due in December 2023 Hep C Screen: Negative  Wt Readings from Last 3 Encounters:  09/03/20 183 lb (83 kg)  06/20/20 179 lb 12.8 oz (81.6 kg)  05/14/20 178 lb (80.7 kg)   BP Readings from Last 3 Encounters:  09/03/20 126/64  06/20/20 (!) 152/96  05/14/20 124/80     Review of Systems  Constitutional: Negative for unexpected weight change.  HENT: Negative for rhinorrhea.   Eyes: Negative for visual disturbance.  Respiratory: Negative for cough and shortness of breath.   Cardiovascular: Negative for chest pain.  Gastrointestinal: Negative for constipation and diarrhea.  Genitourinary: Negative for difficulty urinating.       Decreased libido  Musculoskeletal: Negative for arthralgias and myalgias.  Skin: Negative for rash.  Allergic/Immunologic: Negative for environmental allergies.  Neurological: Negative for dizziness and headaches.  Psychiatric/Behavioral: The patient is nervous/anxious.        Past Medical History:  Diagnosis Date  . Diabetes mellitus without complication ([place])    followed by PCP  . Diverticulosis    noted on  colonoscopy  . Essential hypertension   . GERD (gastroesophageal reflux disease)   . Hemorrhoids   . Insomnia      Social History   Socioeconomic History  . Marital status: Married    Spouse name: Not on file  . Number of children: Not on file  . Years of education: Not on file  . Highest education level: Not on file  Occupational History  . Not on file  Tobacco Use  . Smoking status: Never Smoker  . Smokeless tobacco: Never Used  Vaping Use  . Vaping Use: Never used  Substance and Sexual Activity  . Alcohol use: Yes    Alcohol/week: 3.0 standard drinks    Types: 3 Standard drinks or equivalent per week  . Drug use: No  . Sexual activity: Yes    Birth control/protection: Post-menopausal  Other Topics Concern  . Not on file  Social History Narrative   Married.   2 children.   Works in [employer] at [employer].    Enjoys traveling, spending time with family.    Social Determinants of Health   Financial Resource Strain: Not on file  Food Insecurity: Not on file  Transportation Needs: Not on file  Physical Activity: Not on file  Stress: Not on file  Social Connections: Not on file  Intimate Partner Violence: Not on file    Past Surgical History:  Procedure Laterality Date  . APPENDECTOMY  2009  . CERVICAL FUSION  2010  . CESAREAN SECTION    . CHOLECYSTECTOMY  2000  . ENDOMETRIAL ABLATION    . GYNECOLOGIC CRYOSURGERY    . RETROPUBIC SLING  10/2009   Dr. Davis G[facility]d at [facility]  . REVISION URINARY SLING  11/19/2009   with cysto due to urinary retension    Family History  Problem Relation Age of Onset  . Arthritis Mother   . Diabetes Mother   . Colon cancer Mother 39  . Lung cancer Father   . Hypertension Father   . Heart attack Father   . Heart attack Brother 19  . Breast cancer Neg Hx     Allergies  Allergen Reactions  . Erythromycin Ethylsuccinate Rash  . Oxycodone-Acetaminophen Nausea Only    Current Outpatient Medications on  File Prior to Visit  Medication Sig Dispense Refill  . atorvastatin (LIPITOR) 10 MG tablet TAKE 1 TABLET BY MOUTH DAILY FOR CHOLESTEROL 90 tablet 0  . citalopram (CELEXA) 10 MG tablet TAKE ONE TABLET EVERY DAY FOR ANXIETY 90 tablet 1  . estradiol (ESTRACE VAGINAL) 0.1 MG/GM vaginal cream 1 gram pv twice weekly 42.5 g 3  . lisinopril (ZESTRIL) 30 MG tablet TAKE 1 TABLET BY MOUTH DAILY FOR BLOOD PRESSURE 90 tablet 3  . Multiple Vitamin (MULTIVITAMIN) tablet Take 1 tablet by mouth daily.    Marland Kitchen omeprazole (PRILOSEC) 20 MG capsule Take 1 capsule (20 mg total) by mouth daily. For heartburn. 90 capsule 1  . VITAMIN D PO Take by mouth.     No current facility-administered medications on file prior to visit.    BP 126/64   Pulse 94   Temp 97.6 F (36.4 C) (Temporal)   Ht 5\' 5"  (1.651 m)   Wt 183 lb (83 kg)   SpO2 97%   BMI 30.45 kg/m    Objective:   Physical Exam Constitutional:      Appearance: She is well-nourished.  HENT:     Right Ear: Tympanic membrane and ear canal normal.     Left Ear: Tympanic membrane and ear canal normal.     Mouth/Throat:     Mouth: Oropharynx is clear and moist.  Eyes:     Extraocular Movements: EOM normal.     Pupils: Pupils are equal, round, and reactive to light.  Cardiovascular:     Rate and Rhythm: Normal rate and regular rhythm.  Pulmonary:     Effort: Pulmonary effort is normal.     Breath sounds: Normal breath sounds.  Abdominal:     General: Bowel sounds are normal.     Palpations: Abdomen is soft.     Tenderness: There is no abdominal tenderness.  Musculoskeletal:        General: Normal range of motion.     Cervical back: Neck supple.  Skin:    General: Skin is warm and dry.  Neurological:     Mental Status: She is alert and oriented to person, place, and time.     Cranial Nerves: No cranial nerve deficit.     Deep Tendon Reflexes:     Reflex Scores:      Patellar reflexes are 2+ on the right side and 2+ on the left  side. Psychiatric:        Mood and Affect: Mood and affect and mood normal.            Assessment & Plan:

## 2020-09-03 NOTE — Assessment & Plan Note (Signed)
Shingrix due, she will set up nurse visits. Pap smear and mammogram UTD, follows with GYN. Colonoscopy UTD, due in December 2023.  Discussed the importance of a healthy diet and regular exercise in order for weight loss, and to reduce the risk of any potential medical problems.  Exam today stable. Labs reviewed.

## 2020-09-03 NOTE — Assessment & Plan Note (Signed)
Chronic, could not tolerate weaning off omeprazole, continue omeprazole 20 mg for now. She will work on weight loss and avoid triggers.

## 2020-09-03 NOTE — Patient Instructions (Signed)
Stop taking citalopram 10 mg for anxiety. Start taking sertraline (Zoloft) 50 mg for anxiety.  Start exercising. You should be getting 150 minutes of moderate intensity exercise weekly.  Continue to work on a healthy diet. Ensure you are consuming 64 ounces of water daily.  Schedule nurse visits for your shingles vaccines.  Please schedule a lab appointment in 6 months for A1C check.  It was a pleasure to see you today!   Preventive Care 47-59 Years Old, Female Preventive care refers to lifestyle choices and visits with your health care provider that can promote health and wellness. This includes:  A yearly physical exam. This is also called an annual wellness visit.  Regular dental and eye exams.  Immunizations.  Screening for certain conditions.  Healthy lifestyle choices, such as: ? Eating a healthy diet. ? Getting regular exercise. ? Not using drugs or products that contain nicotine and tobacco. ? Limiting alcohol use. What can I expect for my preventive care visit? Physical exam Your health care provider will check your:  Height and weight. These may be used to calculate your BMI (body mass index). BMI is a measurement that tells if you are at a healthy weight.  Heart rate and blood pressure.  Body temperature.  Skin for abnormal spots. Counseling Your health care provider may ask you questions about your:  Past medical problems.  Family's medical history.  Alcohol, tobacco, and drug use.  Emotional well-being.  Home life and relationship well-being.  Sexual activity.  Diet, exercise, and sleep habits.  Work and work Statistician.  Access to firearms.  Method of birth control.  Menstrual cycle.  Pregnancy history. What immunizations do I need? Vaccines are usually given at various ages, according to a schedule. Your health care provider will recommend vaccines for you based on your age, medical history, and lifestyle or other factors, such as  travel or where you work.   What tests do I need? Blood tests  Lipid and cholesterol levels. These may be checked every 5 years, or more often if you are over 57 years old.  Hepatitis C test.  Hepatitis B test. Screening  Lung cancer screening. You may have this screening every year starting at age 19 if you have a 30-pack-year history of smoking and currently smoke or have quit within the past 15 years.  Colorectal cancer screening. ? All adults should have this screening starting at age 103 and continuing until age 53. ? Your health care provider may recommend screening at age 59 if you are at increased risk. ? You will have tests every 1-10 years, depending on your results and the type of screening test.  Diabetes screening. ? This is done by checking your blood sugar (glucose) after you have not eaten for a while (fasting). ? You may have this done every 1-3 years.  Mammogram. ? This may be done every 1-2 years. ? Talk with your health care provider about when you should start having regular mammograms. This may depend on whether you have a family history of breast cancer.  BRCA-related cancer screening. This may be done if you have a family history of breast, ovarian, tubal, or peritoneal cancers.  Pelvic exam and Pap test. ? This may be done every 3 years starting at age 61. ? Starting at age 84, this may be done every 5 years if you have a Pap test in combination with an HPV test. Other tests  STD (sexually transmitted disease) testing, if you are at  risk.  Bone density scan. This is done to screen for osteoporosis. You may have this scan if you are at high risk for osteoporosis. Talk with your health care provider about your test results, treatment options, and if necessary, the need for more tests. Follow these instructions at home: Eating and drinking  Eat a diet that includes fresh fruits and vegetables, whole grains, lean protein, and low-fat dairy products.  Take  vitamin and mineral supplements as recommended by your health care provider.  Do not drink alcohol if: ? Your health care provider tells you not to drink. ? You are pregnant, may be pregnant, or are planning to become pregnant.  If you drink alcohol: ? Limit how much you have to 0-1 drink a day. ? Be aware of how much alcohol is in your drink. In the [place] one drink equals one 12 oz bottle of beer (355 mL), one 5 oz glass of wine (148 mL), or one 1 oz glass of hard liquor (44 mL).   Lifestyle  Take daily care of your teeth and gums. Brush your teeth every morning and night with fluoride toothpaste. Floss one time each day.  Stay active. Exercise for at least 30 minutes 5 or more days each week.  Do not use any products that contain nicotine or tobacco, such as cigarettes, e-cigarettes, and chewing tobacco. If you need help quitting, ask your health care provider.  Do not use drugs.  If you are sexually active, practice safe sex. Use a condom or other form of protection to prevent STIs (sexually transmitted infections).  If you do not wish to become pregnant, use a form of birth control. If you plan to become pregnant, see your health care provider for a prepregnancy visit.  If told by your health care provider, take low-dose aspirin daily starting at age 55.  Find healthy ways to cope with stress, such as: ? Meditation, yoga, or listening to music. ? Journaling. ? Talking to a trusted person. ? Spending time with friends and family. Safety  Always wear your seat belt while driving or riding in a vehicle.  Do not drive: ? If you have been drinking alcohol. Do not ride with someone who has been drinking. ? When you are tired or distracted. ? While texting.  Wear a helmet and other protective equipment during sports activities.  If you have firearms in your house, make sure you follow all gun safety procedures. What's next?  Visit your health care provider once a year for an  annual wellness visit.  Ask your health care provider how often you should have your eyes and teeth checked.  Stay up to date on all vaccines. This information is not intended to replace advice given to you by your health care provider. Make sure you discuss any questions you have with your health care provider. Document Revised: 04/01/2020 Document Reviewed: 03/09/2018 Elsevier Patient Education  2021 [employer].

## 2020-09-04 ENCOUNTER — Encounter: Payer: Self-pay | Admitting: Primary Care

## 2020-09-10 ENCOUNTER — Ambulatory Visit (INDEPENDENT_AMBULATORY_CARE_PROVIDER_SITE_OTHER): Payer: BC Managed Care – PPO

## 2020-09-10 ENCOUNTER — Other Ambulatory Visit: Payer: Self-pay

## 2020-09-10 DIAGNOSIS — Z23 Encounter for immunization: Secondary | ICD-10-CM

## 2020-09-10 NOTE — Progress Notes (Signed)
Per orders of Allie Bossier, 1st injection of shingrix given by Loreen Freud. Patient tolerated injection well.

## 2020-10-08 ENCOUNTER — Other Ambulatory Visit: Payer: Self-pay | Admitting: Primary Care

## 2020-10-08 DIAGNOSIS — F411 Generalized anxiety disorder: Secondary | ICD-10-CM

## 2020-10-08 DIAGNOSIS — K219 Gastro-esophageal reflux disease without esophagitis: Secondary | ICD-10-CM

## 2020-10-08 NOTE — Telephone Encounter (Signed)
Need update on how Sertraline is working for her

## 2020-10-09 ENCOUNTER — Other Ambulatory Visit: Payer: Self-pay

## 2020-10-09 DIAGNOSIS — F411 Generalized anxiety disorder: Secondary | ICD-10-CM

## 2020-10-09 NOTE — Telephone Encounter (Signed)
Called patient she would like refills. States that Zoloft has helped very little but would like to call back next week to make follow up with Anda Kraft to talk about increasing. She would like to continue at this dose until appointment made. Have called in 1 refill for her. Will call if any questions/problems.

## 2020-10-09 NOTE — Telephone Encounter (Signed)
Pharmacy requests refill on: Sertraline 50 mg   LAST REFILL: 09/03/2020 (Q-30, R-0) LAST OV: 09/03/2020 NEXT OV: Not Scheduled  PHARMACY: [facility]

## 2020-10-14 ENCOUNTER — Other Ambulatory Visit: Payer: Self-pay | Admitting: Primary Care

## 2020-10-14 DIAGNOSIS — E781 Pure hyperglyceridemia: Secondary | ICD-10-CM

## 2020-11-11 ENCOUNTER — Other Ambulatory Visit: Payer: Self-pay

## 2020-11-11 DIAGNOSIS — F411 Generalized anxiety disorder: Secondary | ICD-10-CM

## 2020-11-11 DIAGNOSIS — M189 Osteoarthritis of first carpometacarpal joint, unspecified: Secondary | ICD-10-CM | POA: Diagnosis not present

## 2020-11-11 MED ORDER — SERTRALINE HCL 50 MG PO TABS: ORAL_TABLET | ORAL | 0 refills | Status: DC

## 2020-12-02 ENCOUNTER — Ambulatory Visit: Payer: BC Managed Care – PPO | Admitting: Primary Care

## 2020-12-02 ENCOUNTER — Other Ambulatory Visit: Payer: Self-pay

## 2020-12-02 ENCOUNTER — Encounter: Payer: Self-pay | Admitting: Primary Care

## 2020-12-02 VITALS — BP 126/72 | HR 92 | Temp 98.1°F | Ht 65.0 in | Wt 187.0 lb

## 2020-12-02 DIAGNOSIS — F411 Generalized anxiety disorder: Secondary | ICD-10-CM | POA: Diagnosis not present

## 2020-12-02 MED ORDER — SERTRALINE HCL 100 MG PO TABS: 100.0000 mg | ORAL_TABLET | Freq: Every day | ORAL | 0 refills | Status: DC

## 2020-12-02 NOTE — Assessment & Plan Note (Signed)
Libido side effects have improved, however, anxiety has not. We discussed options including changing medications vs dose increase.  She opts for dose increase so we changed Zoloft to 100 mg. She will take 75 mg for a few days, then increase to 100 mg.  She will update in 4 weeks.

## 2020-12-02 NOTE — Progress Notes (Signed)
Subjective:    Patient ID: Michelle Figueroa, female    DOB: [birth year], 60 y.o.   MRN: [id number]  HPI  Michelle Figueroa is a very pleasant 60 y.o. female with a history of hypertension, type 2 diabetes, GAD, hyperlipidemia who presents today for follow up of anxiety.  She was last evaluated in late February 2022 for CPE, endorsed some improvement on citalopram 10 mg but continued to notice breakthrough symptoms and also a moderate decrease in libido. Given this we decided to change to Zoloft 50 mg.   Since her last visit she's noticed improvement in her libido. She continues to feel anxious, is making a lot of mistakes at work, very stressed with her mother who has dementia, cannot concentrate. She feels that she needs a dose increase of her Zoloft.    Review of Systems  Respiratory: Negative for shortness of breath.   Cardiovascular: Negative for chest pain.  Psychiatric/Behavioral: Positive for decreased concentration. The patient is nervous/anxious.        See HPI         Past Medical History:  Diagnosis Date  . Diabetes mellitus without complication ([place])    followed by PCP  . Diverticulosis    noted on colonoscopy  . Essential hypertension   . GERD (gastroesophageal reflux disease)   . Hemorrhoids   . Insomnia     Social History   Socioeconomic History  . Marital status: Married    Spouse name: Not on file  . Number of children: Not on file  . Years of education: Not on file  . Highest education level: Not on file  Occupational History  . Not on file  Tobacco Use  . Smoking status: Never Smoker  . Smokeless tobacco: Never Used  Vaping Use  . Vaping Use: Never used  Substance and Sexual Activity  . Alcohol use: Yes    Alcohol/week: 3.0 standard drinks    Types: 3 Standard drinks or equivalent per week  . Drug use: No  . Sexual activity: Yes    Birth control/protection: Post-menopausal  Other Topics Concern  . Not on file  Social History Narrative    Married.   2 children.   Works in [employer] at [employer].    Enjoys traveling, spending time with family.    Social Determinants of Health   Financial Resource Strain: Not on file  Food Insecurity: Not on file  Transportation Needs: Not on file  Physical Activity: Not on file  Stress: Not on file  Social Connections: Not on file  Intimate Partner Violence: Not on file    Past Surgical History:  Procedure Laterality Date  . APPENDECTOMY  2009  . CERVICAL FUSION  2010  . CESAREAN SECTION    . CHOLECYSTECTOMY  2000  . ENDOMETRIAL ABLATION    . GYNECOLOGIC CRYOSURGERY    . RETROPUBIC SLING  10/2009   Dr. Davis Gourd at [facility]  . REVISION URINARY SLING  11/19/2009   with cysto due to urinary retension    Family History  Problem Relation Age of Onset  . Arthritis Mother   . Diabetes Mother   . Colon cancer Mother 3  . Lung cancer Father   . Hypertension Father   . Heart attack Father   . Heart attack Brother 70  . Breast cancer Neg Hx     Allergies  Allergen Reactions  . Erythromycin Ethylsuccinate Rash  . Oxycodone-Acetaminophen Nausea Only    Current Outpatient Medications on  File Prior to Visit  Medication Sig Dispense Refill  . atorvastatin (LIPITOR) 10 MG tablet TAKE 1 TABLET BY MOUTH DAILY FOR CHOLESTEROL 90 tablet 2  . estradiol (ESTRACE VAGINAL) 0.1 MG/GM vaginal cream 1 gram pv twice weekly 42.5 g 3  . Multiple Vitamin (MULTIVITAMIN) tablet Take 1 tablet by mouth daily.    Marland Kitchen omeprazole (PRILOSEC) 20 MG capsule TAKE 1 CAPSULE BY MOUTH DAILY FOR HEARTBURN 90 capsule 1  . sertraline (ZOLOFT) 50 MG tablet TAKE 1 TABLET BY MOUTH DAILY FOR ANXIETYAND DEPRESSION 30 tablet 0  . VITAMIN D PO Take by mouth.    . Ergocalciferol (VITAMIN D2) 10 MCG (400 UNIT) TABS Vitamin D2    . lisinopril (ZESTRIL) 30 MG tablet TAKE 1 TABLET BY MOUTH DAILY FOR BLOOD PRESSURE 90 tablet 3   No current facility-administered medications on file prior to visit.     BP 126/72   Pulse 92   Temp 98.1 F (36.7 C) (Temporal)   Ht 5\' 5"  (1.651 m)   Wt 187 lb (84.8 kg)   SpO2 97%   BMI 31.12 kg/m  Objective:   Physical Exam Cardiovascular:     Rate and Rhythm: Normal rate and regular rhythm.  Pulmonary:     Effort: Pulmonary effort is normal.     Breath sounds: Normal breath sounds.  Musculoskeletal:     Cervical back: Neck supple.  Skin:    General: Skin is warm and dry.  Psychiatric:        Mood and Affect: Mood normal.           Assessment & Plan:      This visit occurred during the SARS-CoV-2 public health emergency.  Safety protocols were in place, including screening questions prior to the visit, additional usage of staff PPE, and extensive cleaning of exam room while observing appropriate contact time as indicated for disinfecting solutions.

## 2020-12-02 NOTE — Patient Instructions (Signed)
We increased your dose of sertraline (Zoloft) to 100 mg. Take 1 and 1/2 tablets until your current bottle is empty. Take one of the 100 mg tablets once daily.  Please update me via my chart in 4 weeks.  It was a pleasure to see you today!

## 2020-12-12 DIAGNOSIS — K219 Gastro-esophageal reflux disease without esophagitis: Secondary | ICD-10-CM | POA: Diagnosis not present

## 2020-12-12 DIAGNOSIS — M180 Bilateral primary osteoarthritis of first carpometacarpal joints: Secondary | ICD-10-CM | POA: Diagnosis not present

## 2020-12-12 DIAGNOSIS — E785 Hyperlipidemia, unspecified: Secondary | ICD-10-CM | POA: Diagnosis not present

## 2020-12-12 DIAGNOSIS — M1812 Unilateral primary osteoarthritis of first carpometacarpal joint, left hand: Secondary | ICD-10-CM | POA: Diagnosis not present

## 2020-12-12 DIAGNOSIS — Z885 Allergy status to narcotic agent status: Secondary | ICD-10-CM | POA: Diagnosis not present

## 2020-12-12 DIAGNOSIS — Z79890 Hormone replacement therapy (postmenopausal): Secondary | ICD-10-CM | POA: Diagnosis not present

## 2020-12-12 DIAGNOSIS — G8918 Other acute postprocedural pain: Secondary | ICD-10-CM | POA: Diagnosis not present

## 2020-12-12 DIAGNOSIS — I10 Essential (primary) hypertension: Secondary | ICD-10-CM | POA: Diagnosis not present

## 2020-12-12 DIAGNOSIS — M79642 Pain in left hand: Secondary | ICD-10-CM | POA: Diagnosis not present

## 2020-12-25 DIAGNOSIS — Z4889 Encounter for other specified surgical aftercare: Secondary | ICD-10-CM | POA: Diagnosis not present

## 2020-12-25 DIAGNOSIS — M1812 Unilateral primary osteoarthritis of first carpometacarpal joint, left hand: Secondary | ICD-10-CM | POA: Diagnosis not present

## 2020-12-25 HISTORY — PX: OTHER SURGICAL HISTORY: SHX169

## 2021-01-01 DIAGNOSIS — M1812 Unilateral primary osteoarthritis of first carpometacarpal joint, left hand: Secondary | ICD-10-CM | POA: Diagnosis not present

## 2021-01-15 ENCOUNTER — Other Ambulatory Visit: Payer: Self-pay

## 2021-01-15 ENCOUNTER — Ambulatory Visit: Payer: BC Managed Care – PPO | Admitting: Dermatology

## 2021-01-15 DIAGNOSIS — L821 Other seborrheic keratosis: Secondary | ICD-10-CM

## 2021-01-15 DIAGNOSIS — L578 Other skin changes due to chronic exposure to nonionizing radiation: Secondary | ICD-10-CM | POA: Diagnosis not present

## 2021-01-15 DIAGNOSIS — L820 Inflamed seborrheic keratosis: Secondary | ICD-10-CM | POA: Diagnosis not present

## 2021-01-15 MED ORDER — MOMETASONE FUROATE 0.1 % EX CREA: TOPICAL_CREAM | CUTANEOUS | 0 refills | Status: DC

## 2021-01-15 NOTE — Progress Notes (Signed)
   Follow-Up Visit   Subjective  Michelle Figueroa is a 60 y.o. female who presents for the following: lesion (On the right chest - a few days, itching, burning, stinging. Patient is using OTC Neosporin.).  [place].   The following portions of the chart were reviewed this encounter and updated as appropriate:       Review of Systems:  No other skin or systemic complaints except as noted in HPI or Assessment and Plan.  Objective  Well appearing patient in no apparent distress; mood and affect are within normal limits.  A focused examination was performed including the chest, face, and arms. Relevant physical exam findings are noted in the Assessment and Plan.  R upper chest Pink eroded flat papule. Surrounding pink papules on the mid upper chest.  Assessment & Plan  Inflamed seborrheic keratosis R upper chest  Vs insect bite reaction -   D/C Neosporin due to risk contact dermatitis, discussed with patient to watch for signs of infection and RTC PRN.  Start Mometasone 0.1% cream to aa's QD-BID PRN until itchy rash cleared  Topical steroids (such as triamcinolone, fluocinolone, fluocinonide, mometasone, clobetasol, halobetasol, betamethasone, hydrocortisone) can cause thinning and lightening of the skin if they are used for too long in the same area. Your physician has selected the right strength medicine for your problem and area affected on the body. Please use your medication only as directed by your physician to prevent side effects.     mometasone (ELOCON) 0.1 % cream - R upper chest Apply to itchy areas on chest QD-BID PRN. Topical steroids (such as triamcinolone, fluocinolone, fluocinonide, mometasone, clobetasol, halobetasol, betamethasone, hydrocortisone) can cause thinning and lightening of the skin if they are used for too long in the same area. Your physician has selected the right strength medicine for your problem and area affected on the body.  Please use your medication only as directed by your physician to prevent side effects.  Seborrheic Keratoses - Stuck-on, waxy, tan-brown papules and/or plaques  - Benign-appearing - Discussed benign etiology and prognosis. - Observe - Call for any changes  Actinic Damage - chronic, secondary to cumulative UV radiation exposure/sun exposure over time - diffuse scaly erythematous macules with underlying dyspigmentation - Recommend daily broad spectrum sunscreen SPF 30+ to sun-exposed areas, reapply every 2 hours as needed.  - Recommend staying in the shade or wearing long sleeves, sun glasses (UVA+UVB protection) and wide brim hats (4-inch brim around the entire circumference of the hat). - Call for new or changing lesions.  Return for TBSE as scheduled.  Luther Redo, CMA, am acting as scribe for Brendolyn Patty, MD .  Documentation: I have reviewed the above documentation for accuracy and completeness, and I agree with the above.  Brendolyn Patty MD

## 2021-01-15 NOTE — Patient Instructions (Signed)

## 2021-01-20 DIAGNOSIS — M1812 Unilateral primary osteoarthritis of first carpometacarpal joint, left hand: Secondary | ICD-10-CM | POA: Diagnosis not present

## 2021-02-02 ENCOUNTER — Other Ambulatory Visit: Payer: Self-pay | Admitting: Primary Care

## 2021-02-02 DIAGNOSIS — Z1231 Encounter for screening mammogram for malignant neoplasm of breast: Secondary | ICD-10-CM

## 2021-02-03 ENCOUNTER — Telehealth: Payer: Self-pay | Admitting: Primary Care

## 2021-02-03 NOTE — Telephone Encounter (Signed)
Called pt to remind her of her appt. Pt stated that she only needs her 2nd shingles shot. Pt wants to know if she has to wait on her 2nd shingles shot because she just had her covid booster on the July 27th. Please Advise

## 2021-02-04 ENCOUNTER — Ambulatory Visit: Payer: BC Managed Care – PPO | Admitting: Primary Care

## 2021-02-04 NOTE — Telephone Encounter (Signed)
Yes, I would wait 2 weeks in between vaccines.

## 2021-02-04 NOTE — Telephone Encounter (Signed)
Please advise 

## 2021-02-05 NOTE — Telephone Encounter (Signed)
Left message to return call to our office.  

## 2021-02-06 NOTE — Telephone Encounter (Signed)
Left a detailed Vm (DPR) relaying Kates recommendation for pt to wait 2 weeks between vaccines.

## 2021-02-10 ENCOUNTER — Other Ambulatory Visit: Payer: Self-pay

## 2021-02-10 ENCOUNTER — Ambulatory Visit (INDEPENDENT_AMBULATORY_CARE_PROVIDER_SITE_OTHER): Payer: Self-pay | Admitting: Dermatology

## 2021-02-10 DIAGNOSIS — L988 Other specified disorders of the skin and subcutaneous tissue: Secondary | ICD-10-CM

## 2021-02-10 NOTE — Patient Instructions (Signed)

## 2021-02-10 NOTE — Progress Notes (Signed)
   Follow-Up Visit   Subjective  Michelle Figueroa is a 60 y.o. female who presents for the following: Facial Elastosis (Patient would like to discuss treatment options of Botox today. ).  The following portions of the chart were reviewed this encounter and updated as appropriate:   Tobacco  Allergies  Meds  Problems  Med Hx  Surg Hx  Fam Hx     Review of Systems:  No other skin or systemic complaints except as noted in HPI or Assessment and Plan.  Objective  Well appearing patient in no apparent distress; mood and affect are within normal limits.  A focused examination was performed including the face. Relevant physical exam findings are noted in the Assessment and Plan.  Face Rhytides and volume loss.                   Assessment & Plan  Elastosis of skin Face  Start [employer] Medicinals mix QHS: Tretinoin: 0.025% Niacinamide: 2% Hyaluronic Acid: 0.25% Vehicle: Cream  Botox 28.25 units injected as marked: - frown complex 22 units - forehead 6.25 units     Botox Injection - Face Location: See attached image  Informed consent: Discussed risks (infection, pain, bleeding, bruising, swelling, allergic reaction, paralysis of nearby muscles, eyelid droop, double vision, neck weakness, difficulty breathing, headache, undesirable cosmetic result, and need for additional treatment) and benefits of the procedure, as well as the alternatives.  Informed consent was obtained.  Preparation: The area was cleansed with alcohol.  Procedure Details:  Botox was injected into the dermis with a 30-gauge needle. Pressure applied to any bleeding. Ice packs offered for swelling.  Lot Number:  [id number] Expiration:  12/2022 Total Units Injected:  28.25  Plan: Patient was instructed to remain upright for 4 hours. Patient was instructed to avoid massaging the face and avoid vigorous exercise for the rest of the day. Tylenol may be used for headache.  Allow 2 weeks before  returning to clinic for additional dosing as needed. Patient will call for any problems.   Return in about 2 weeks (around 02/24/2021) for Botox follow up .  Luther Redo, CMA, am acting as scribe for Forest Gleason, MD .  Documentation: I have reviewed the above documentation for accuracy and completeness, and I agree with the above.  Forest Gleason, MD

## 2021-02-11 DIAGNOSIS — H1132 Conjunctival hemorrhage, left eye: Secondary | ICD-10-CM | POA: Diagnosis not present

## 2021-02-16 ENCOUNTER — Other Ambulatory Visit: Payer: Self-pay | Admitting: Primary Care

## 2021-02-16 DIAGNOSIS — E119 Type 2 diabetes mellitus without complications: Secondary | ICD-10-CM

## 2021-02-18 ENCOUNTER — Encounter: Payer: Self-pay | Admitting: Dermatology

## 2021-02-25 ENCOUNTER — Ambulatory Visit (INDEPENDENT_AMBULATORY_CARE_PROVIDER_SITE_OTHER): Payer: Self-pay | Admitting: Dermatology

## 2021-02-25 ENCOUNTER — Other Ambulatory Visit: Payer: Self-pay

## 2021-02-25 DIAGNOSIS — R6884 Jaw pain: Secondary | ICD-10-CM

## 2021-02-25 DIAGNOSIS — L988 Other specified disorders of the skin and subcutaneous tissue: Secondary | ICD-10-CM

## 2021-02-25 NOTE — Progress Notes (Signed)
   Follow-Up Visit   Subjective  Michelle Figueroa is a 60 y.o. female who presents for the following: Follow-up (Patient here today for 2 week follow up on botox. Patient is overall pleased with results but has concerns with puffiness eyebrows. Patient would like to discuss pain at jaw pain and would like to discuss if botox is option to help with pain. ).  The following portions of the chart were reviewed this encounter and updated as appropriate:  Tobacco  Allergies  Meds  Problems  Med Hx  Surg Hx  Fam Hx       Objective  Well appearing patient in no apparent distress; mood and affect are within normal limits.  A focused examination was performed including face. Relevant physical exam findings are noted in the Assessment and Plan.  face Elastosis of skin        Assessment & Plan  Elastosis of skin face  Patient concerned with puffiness of eyebrows but c/w photos from before botox  Will slightly increase botox today at forehead and glabellar complex   Botox Injection - face Location: forehead and eyebrows   Informed consent: Discussed risks (infection, pain, bleeding, bruising, swelling, allergic reaction, paralysis of nearby muscles, eyelid droop, double vision, neck weakness, difficulty breathing, headache, undesirable cosmetic result, and need for additional treatment) and benefits of the procedure, as well as the alternatives.  Informed consent was obtained.  Preparation: The area was cleansed with alcohol.  Procedure Details:  Botox was injected into the dermis with a 30-gauge needle. Pressure applied to any bleeding. Ice packs offered for swelling.  Lot Number:  [id number] Expiration:  12/2022  Total Units Injected:  5.25  Plan: Patient was instructed to remain upright for 4 hours. Patient was instructed to avoid massaging the face and avoid vigorous exercise for the rest of the day. Tylenol may be used for headache.  Allow 2 weeks before returning to clinic  for additional dosing as needed. Patient will call for any problems.   Jaw pain Head - Anterior (Face)  Patient complains of pain at jaw from grinding teeth. Reports night guard doesn't help and is experiencing pain  Recommend follow up with patient's dentist. 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 I, Ruthell Rummage, CMA, am acting as scribe for Forest Gleason, MD.  Documentation: I have reviewed the above documentation for accuracy and completeness, and I agree with the above.  Forest Gleason, MD

## 2021-02-25 NOTE — Patient Instructions (Signed)

## 2021-02-28 DIAGNOSIS — Z20822 Contact with and (suspected) exposure to covid-19: Secondary | ICD-10-CM | POA: Diagnosis not present

## 2021-03-02 ENCOUNTER — Other Ambulatory Visit: Payer: Self-pay

## 2021-03-02 ENCOUNTER — Ambulatory Visit: Payer: BC Managed Care – PPO | Admitting: Medical

## 2021-03-02 ENCOUNTER — Encounter: Payer: Self-pay | Admitting: Dermatology

## 2021-03-02 VITALS — BP 160/98 | HR 92 | Temp 99.4°F | Resp 16

## 2021-03-02 DIAGNOSIS — M791 Myalgia, unspecified site: Secondary | ICD-10-CM

## 2021-03-02 DIAGNOSIS — R519 Headache, unspecified: Secondary | ICD-10-CM

## 2021-03-02 DIAGNOSIS — J029 Acute pharyngitis, unspecified: Secondary | ICD-10-CM

## 2021-03-02 DIAGNOSIS — Z20822 Contact with and (suspected) exposure to covid-19: Secondary | ICD-10-CM

## 2021-03-02 DIAGNOSIS — R059 Cough, unspecified: Secondary | ICD-10-CM

## 2021-03-02 LAB — POC COVID19 BINAXNOW: SARS Coronavirus 2 Ag: NEGATIVE

## 2021-03-02 NOTE — Patient Instructions (Signed)
COVID-19: What to Do if You Are Sick CDC has updated isolation and quarantine recommendations for the public, and is revising the CDC website to reflect these changes. These recommendations do not apply to healthcare personnel and do not supersede state, local, tribal, or territorial laws, rules, andregulations. If you have a fever, cough or other symptoms, you might have COVID-19. Most people have mild illness and are able to recover at home. If you are sick: Keep track of your symptoms. If you have an emergency warning sign (including trouble breathing), call 911. Steps to help prevent the spread of COVID-19 if you are sick If you are sick with COVID-19 or think you might have COVID-19, follow the steps below to care for yourself and to help protect other peoplein your home and community. Stay home except to get medical care Stay home. Most people with COVID-19 have mild illness and can recover at home without medical care. Do not leave your home, except to get medical care. Do not visit public areas. Take care of yourself. Get rest and stay hydrated. Take over-the-counter medicines, such as acetaminophen, to help you feel better. Stay in touch with your doctor. Call before you get medical care. Be sure to get care if you have trouble breathing, or have any other emergency warning signs, or if you think it is an emergency. Avoid public transportation, ride-sharing, or taxis. Separate yourself from other people As much as possible, stay in a specific room and away from other people and pets in your home. If possible, you should use a separate bathroom. If you need to be around other people or animals in oroutside of the home, wear a mask. Tell your close contactsthat they may have been exposed to COVID-19. An infected person can spread COVID-19 starting 48 hours (or 2 days) before the person has any symptoms or tests positive. By letting your close contacts know they may have been exposed to COVID-19,  you are helping to protect everyone. Additional guidance is available for those living in close quarters and shared housing. See COVID-19 and Animals if you have questions about pets. If you are diagnosed with COVID-19, someone from the health department may call you. Answer the call to slow the spread. Monitor your symptoms Symptoms of COVID-19 include fever, cough, or other symptoms. Follow care instructions from your healthcare provider and local health department. Your local health authorities may give instructions on checking your symptoms and reporting information. When to seek emergency medical attention Look for emergency warning signs* for COVID-19. If someone is showing any of these signs, seek emergency medical care immediately: Trouble breathing Persistent pain or pressure in the chest New confusion Inability to wake or stay awake Pale, gray, or blue-colored skin, lips, or nail beds, depending on skin tone *This list is not all possible symptoms. Please call your medical provider forany other symptoms that are severe or concerning to you. Call 911 or call ahead to your local emergency facility: Notify the operator that you are seeking care for someone who has or may haveCOVID-19. Call ahead before visiting your doctor Call ahead. Many medical visits for routine care are being postponed or done by phone or telemedicine. If you have a medical appointment that cannot be postponed, call your doctor's office, and tell them you have or may have COVID-19. This will help the office protect themselves and other patients. Get tested If you have symptoms of COVID-19, get tested. While waiting for test results, you stay away from others,  including staying apart from those living in your household. Self-tests are one of several options for testing for the virus that causes COVID-19 and may be more convenient than laboratory-based tests and point-of-care tests. Ask your healthcare provider or  your local health department if you need help interpreting your test results. You can visit your state, tribal, local, and territorial health department's website to look for the latest local information on testing sites. If you are sick, wear a mask over your nose and mouth You should wear a mask over your nose and mouth if you must be around other people or animals, including pets (even at home). You don't need to wear the mask if you are alone. If you can't put on a mask (because of trouble breathing, for example), cover your coughs and sneezes in some other way. Try to stay at least 6 feet away from other people. This will help protect the people around you. Masks should not be placed on young children under age 41 years, anyone who has trouble breathing, or anyone who is not able to remove the mask without help. Note: During the COVID-19 pandemic, medical grade facemasks are reserved forhealthcare workers and some first responders. Cover your coughs and sneezes Cover your mouth and nose with a tissue when you cough or sneeze. Throw away used tissues in a lined trash can. Immediately wash your hands with soap and water for at least 20 seconds. If soap and water are not available, clean your hands with an alcohol-based hand sanitizer that contains at least 60% alcohol. Clean your hands often Wash your hands often with soap and water for at least 20 seconds. This is especially important after blowing your nose, coughing, or sneezing; going to the bathroom; and before eating or preparing food. Use hand sanitizer if soap and water are not available. Use an alcohol-based hand sanitizer with at least 60% alcohol, covering all surfaces of your hands and rubbing them together until they feel dry. Soap and water are the best option, especially if hands are visibly dirty. Avoid touching your eyes, nose, and mouth with unwashed hands. Handwashing Tips Avoid sharing personal household items Do not share  dishes, drinking glasses, cups, eating utensils, towels, or bedding with other people in your home. Wash these items thoroughly after using them with soap and water or put in the dishwasher. Clean all "high-touch" surfaces every day Clean and disinfect high-touch surfaces in your "sick room" and bathroom; wear disposable gloves. Let someone else clean and disinfect surfaces in common areas, but you should clean your bedroom and bathroom, if possible. If a caregiver or other person needs to clean and disinfect a sick person's bedroom or bathroom, they should do so on an as-needed basis. The caregiver/other person should wear a mask and disposable gloves prior to cleaning. They should wait as long as possible after the person who is sick has used the bathroom before coming in to clean and use the bathroom. High-touch surfaces include phones, remote controls, counters, tabletops, doorknobs, bathroom fixtures, toilets, keyboards, tablets, and bedside tables. Clean and disinfect areas that may have blood, stool, or body fluids on them. Use household cleaners and disinfectants. Clean the area or item with soap and water or another detergent if it is dirty. Then, use a household disinfectant. Be sure to follow the instructions on the label to ensure safe and effective use of the product. Many products recommend keeping the surface wet for several minutes to ensure germs are killed. Many  also recommend precautions such as wearing gloves and making sure you have good ventilation during use of the product. Use a product from [employer] List N: Disinfectants for Coronavirus ([id number]). Complete Disinfection Guidance When you can be around others after being sick with COVID-19 Deciding when you can be around others is different for different situations. Find out when you can safely end home isolation. For any additional questions about your care,contact your healthcare provider or state or local health  department. 06/18/2020 Content source: [facility] Diseases ([facility]), Division of Viral Diseases This information is not intended to replace advice given to you by your health care provider. Make sure you discuss any questions you have with your healthcare provider. Document Revised: 08/15/2020 Document Reviewed: 08/15/2020 Elsevier Patient Education  [place].

## 2021-03-02 NOTE — Progress Notes (Signed)
Subjective:    Patient ID: Michelle Figueroa, female    DOB: [birth year], 60 y.o.   MRN: [id number]  HPI 60 yo female in non acute distress Exposed to Covid-19 in a  2 hour meeting  on Tuesday, person tested positive for Covid-19 on Wednesday,  and then again  patient exposed by a different  group of people on Thursday who then another person tested positive for Covid-19 on Friday.   Her symptoms started  on Saturday 02/28/21. Symptoms are coughing, HA sore throat and body aches.Feels like she has had a fever, no shortness of breath and no chest pain.   Blood pressure (!) 160/98, pulse 92, temperature 99.4 F (37.4 C), temperature source Oral, resp. rate 16, SpO2 98 %. Allergies  Allergen Reactions   Erythromycin Ethylsuccinate Rash   Oxycodone-Acetaminophen Nausea Only      Review of Systems  Constitutional:  Positive for chills, fatigue and fever.  HENT:  Positive for congestion, postnasal drip, sinus pressure, sinus pain and sore throat. Negative for rhinorrhea.   Respiratory:  Positive for cough. Negative for shortness of breath.   Cardiovascular:  Negative for chest pain.  Gastrointestinal:  Negative for abdominal pain and diarrhea.  Musculoskeletal:  Positive for myalgias.  Skin:  Negative for rash.  Allergic/Immunologic: Negative for environmental allergies.  Neurological:  Positive for headaches. Negative for dizziness, syncope and light-headedness.      Objective:   Physical Exam Vitals and nursing note reviewed.  Constitutional:      Appearance: Normal appearance.  HENT:     Head: Normocephalic and atraumatic.     Right Ear: Tympanic membrane, ear canal and external ear normal.     Left Ear: Tympanic membrane, ear canal and external ear normal.     Nose: Nose normal.     Mouth/Throat:     Mouth: Mucous membranes are moist.     Pharynx: Oropharynx is clear.  Eyes:     Extraocular Movements: Extraocular movements intact.     Conjunctiva/sclera: Conjunctivae  normal.     Pupils: Pupils are equal, round, and reactive to light.  Cardiovascular:     Rate and Rhythm: Normal rate and regular rhythm.     Heart sounds: Normal heart sounds.  Pulmonary:     Effort: Pulmonary effort is normal.     Breath sounds: Normal breath sounds.  Musculoskeletal:        General: Normal range of motion.     Cervical back: Normal range of motion and neck supple.  Lymphadenopathy:     Cervical: No cervical adenopathy.  Skin:    General: Skin is warm and dry.     Capillary Refill: Capillary refill takes less than 2 seconds.  Neurological:     General: No focal deficit present.     Mental Status: She is alert and oriented to person, place, and time. Mental status is at baseline.  Psychiatric:        Mood and Affect: Mood normal.        Behavior: Behavior normal.        Thought Content: Thought content normal.        Judgment: Judgment normal.    Results for orders placed or performed in visit on 03/02/21 (from the past 24 hour(s))  POC COVID-19     Status: Normal   Collection Time: 03/02/21  1:59 PM  Result Value Ref Range   SARS Coronavirus 2 Ag Negative Negative        Assessment &  Plan:  Encounter for Covid testing Patient symptomatic cough, fever, HA, myalgias  May return to work on 03/06/21 if all symptoms are improving and no fever x 24 hours with no OTC Motrin or Tylenol being taken. Isolate , rest, increase fluids, OTC Motrin or Tylenol per package instructions for fever or pain.Work note completed  Covid-19 PCR pending Orders Placed This Encounter  Procedures   Novel Coronavirus, NAA ([facility])    Order Specific Question:   Is this test for diagnosis or screening    Answer:   Diagnosis of ill patient    Order Specific Question:   Symptomatic for COVID-19 as defined by CDC    Answer:   Yes    Order Specific Question:   Date of Symptom Onset    Answer:   02/28/2021    Order Specific Question:   Hospitalized for COVID-19    Answer:   No    Order  Specific Question:   Admitted to ICU for COVID-19    Answer:   No    Order Specific Question:   Previously tested for COVID-19    Answer:   Yes    Order Specific Question:   Resident in a congregate (group) care setting    Answer:   No    Order Specific Question:   Is the patient student?    Answer:   No    Order Specific Question:   Employed in healthcare setting    Answer:   No    Order Specific Question:   Pregnant    Answer:   No    Order Specific Question:   Has patient completed COVID vaccination(s) (2 doses of Pfizer/Moderna 1 dose of [employer])    Answer:   Yes    Order Specific Question:   Has patient completed COVID Booster / 3rd dose    Answer:   Yes    Order Specific Question:   Release to patient    Answer:   Immediate   POC COVID-19    Order Specific Question:   Is this test for diagnosis or screening    Answer:   Diagnosis of ill patient    Order Specific Question:   Symptomatic for COVID-19 as defined by CDC    Answer:   Yes    Order Specific Question:   Date of Symptom Onset    Answer:   02/28/2021    Order Specific Question:   Hospitalized for COVID-19    Answer:   No    Order Specific Question:   Admitted to ICU for COVID-19    Answer:   No    Order Specific Question:   Previously tested for COVID-19    Answer:   Yes    Order Specific Question:   Resident in a congregate (group) care setting    Answer:   No    Order Specific Question:   Employed in healthcare setting    Answer:   No    Order Specific Question:   Pregnant    Answer:   No    Patient verbalizes understanding and had no questions at discharge.

## 2021-03-03 ENCOUNTER — Encounter: Payer: Self-pay | Admitting: Medical

## 2021-03-03 ENCOUNTER — Other Ambulatory Visit: Payer: BC Managed Care – PPO

## 2021-03-03 LAB — NOVEL CORONAVIRUS, NAA: SARS-CoV-2, NAA: NOT DETECTED

## 2021-03-03 LAB — SARS-COV-2, NAA 2 DAY TAT

## 2021-03-04 ENCOUNTER — Other Ambulatory Visit: Payer: Self-pay

## 2021-03-04 ENCOUNTER — Telehealth: Payer: BC Managed Care – PPO | Admitting: Nurse Practitioner

## 2021-03-04 DIAGNOSIS — J0110 Acute frontal sinusitis, unspecified: Secondary | ICD-10-CM

## 2021-03-04 MED ORDER — AMOXICILLIN-POT CLAVULANATE 875-125 MG PO TABS: 1.0000 | ORAL_TABLET | Freq: Two times a day (BID) | ORAL | 0 refills | Status: AC

## 2021-03-04 NOTE — Progress Notes (Signed)
   Subjective:    Patient ID: Michelle Figueroa, female    DOB: 1960-08-11, 60 y.o.   MRN: [id number]  HPI  60 year female who has has symptoms of fever, cough, nasal congestion and body aches for the past 5 days- also sore throat and headache. She has taken several home tests, and PCR returned back yesterday that was negative for COVID as well.   She has not had COVID in the past.  She has had 4 COVID vaccines so far.   She denies a history of bronchitis/pneumonia. Denies tobacco history  Has had sinus infections in the past.   Has been taking Mucinex but this is causing her stomach upset. Also using Tylenol.   Patient consents to telehealth appointment that was performed over the phone  Review of Systems  Constitutional:  Positive for fatigue and fever.  HENT:  Positive for congestion and sinus pressure.   Respiratory:  Positive for cough.   Cardiovascular: Negative.   Gastrointestinal: Negative.   Genitourinary: Negative.   Musculoskeletal: Negative.   Skin: Negative.   Neurological: Negative.       Objective:   Physical Exam  No physical exam was performed during telehealth appointment, patient was in no acute distress during phone conversation with provider.       Assessment & Plan:  1. Acute non-recurrent frontal sinusitis  - amoxicillin-clavulanate (AUGMENTIN) 875-125 MG tablet; Take 1 tablet by mouth 2 (two) times daily for 7 days. Take with food  Dispense: 14 tablet; Refill: 0    May continue to use over the counter cold/flu as needed. Push fluids and rest. She may return to work 24 hours after fever has resolved, but should follow up with [place] if symptoms persist.   Encouraged to wear a mask when returning to work as well.

## 2021-03-06 ENCOUNTER — Other Ambulatory Visit: Payer: Self-pay | Admitting: Primary Care

## 2021-03-06 DIAGNOSIS — F411 Generalized anxiety disorder: Secondary | ICD-10-CM

## 2021-03-19 ENCOUNTER — Other Ambulatory Visit: Payer: Self-pay

## 2021-03-19 ENCOUNTER — Ambulatory Visit
Admission: RE | Admit: 2021-03-19 | Discharge: 2021-03-19 | Disposition: A | Discharge status: Home / Self Care | Payer: BC Managed Care – PPO | Source: Ambulatory Visit | Attending: Primary Care | Admitting: Primary Care

## 2021-03-19 DIAGNOSIS — Z1231 Encounter for screening mammogram for malignant neoplasm of breast: Secondary | ICD-10-CM

## 2021-04-06 ENCOUNTER — Ambulatory Visit: Payer: BC Managed Care – PPO | Admitting: Dermatology

## 2021-04-30 ENCOUNTER — Encounter: Payer: BC Managed Care – PPO | Admitting: Primary Care

## 2021-04-30 DIAGNOSIS — E119 Type 2 diabetes mellitus without complications: Secondary | ICD-10-CM

## 2021-05-01 ENCOUNTER — Other Ambulatory Visit: Payer: Self-pay

## 2021-05-01 DIAGNOSIS — E119 Type 2 diabetes mellitus without complications: Secondary | ICD-10-CM

## 2021-05-03 IMAGING — MG DIGITAL SCREENING BILAT W/ TOMO W/ CAD
6 of 10 series · 6 of 30 positions shown · non-contrast
Comparison: Previous exam(s).

CLINICAL DATA: Screening.

EXAM:
DIGITAL SCREENING BILATERAL MAMMOGRAM WITH TOMO AND CAD

[L MLO synth-2D (1 of 2)]
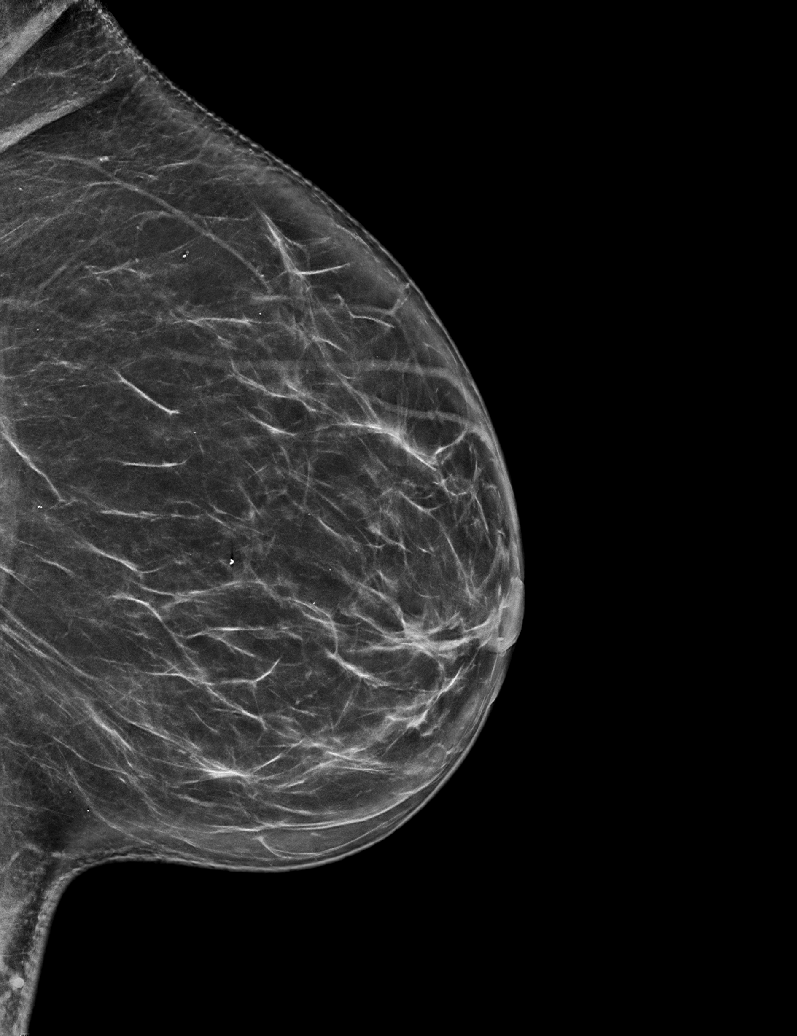

[R CC synth-2D]
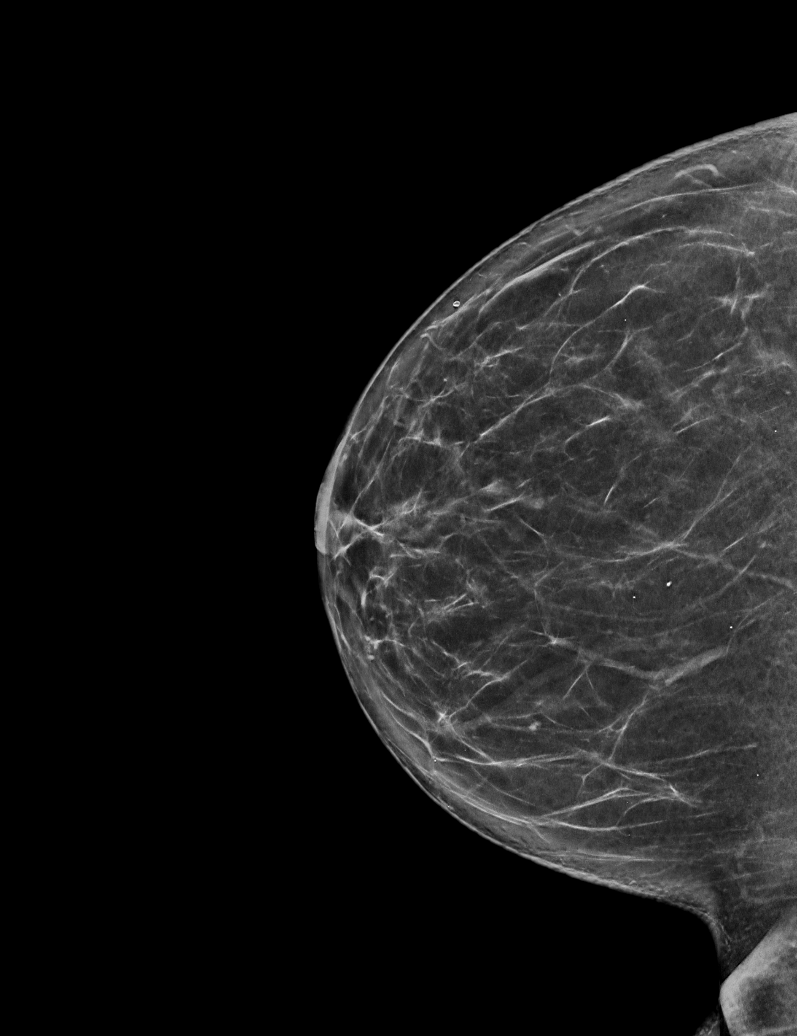

[R MLO synth-2D]
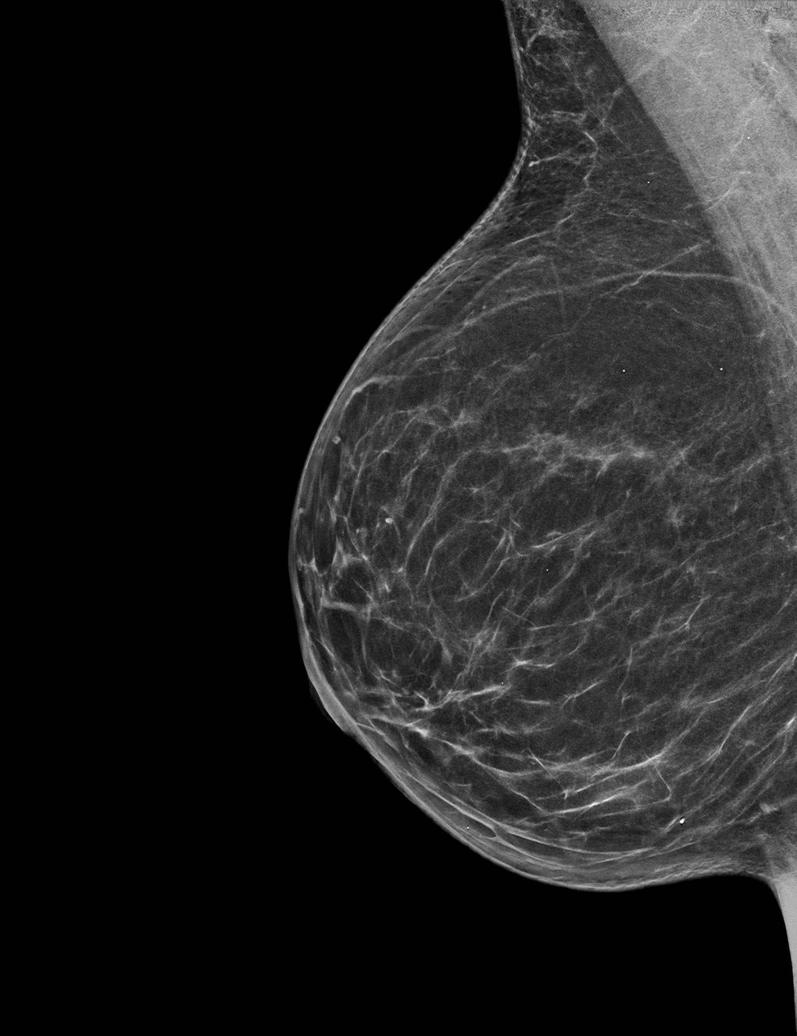

[L CC synth-2D]
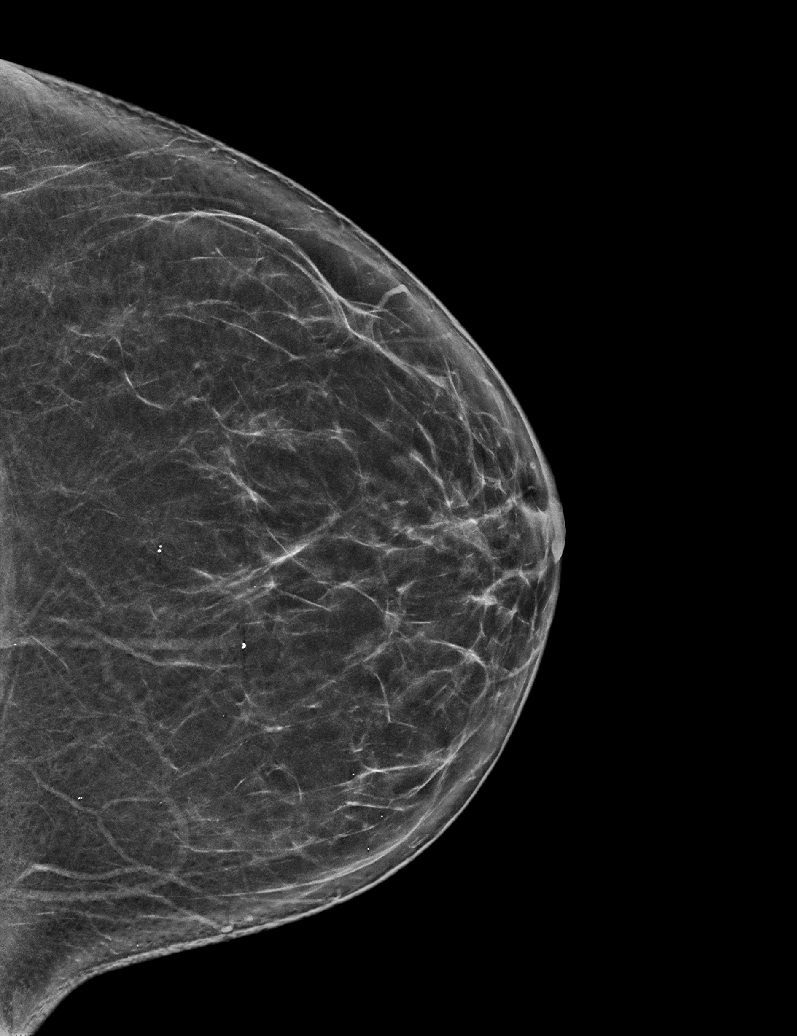

[L MLO synth-2D (2 of 2)]
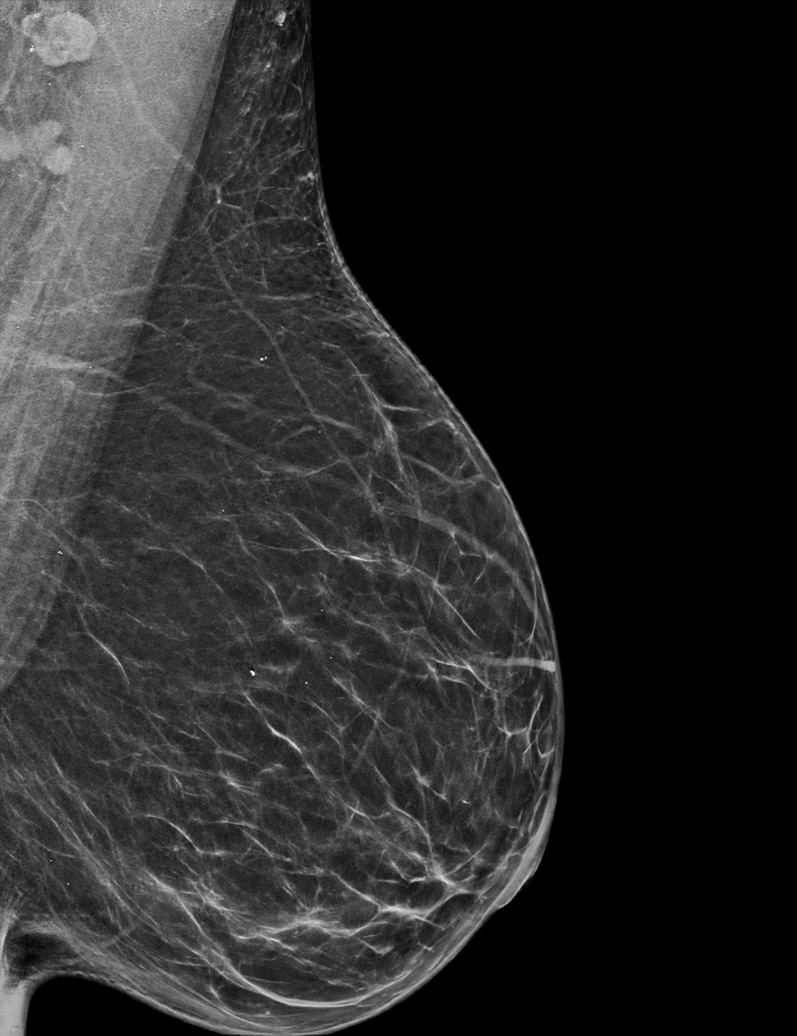

[L MLO tomo · tomo slice 40/79.0]
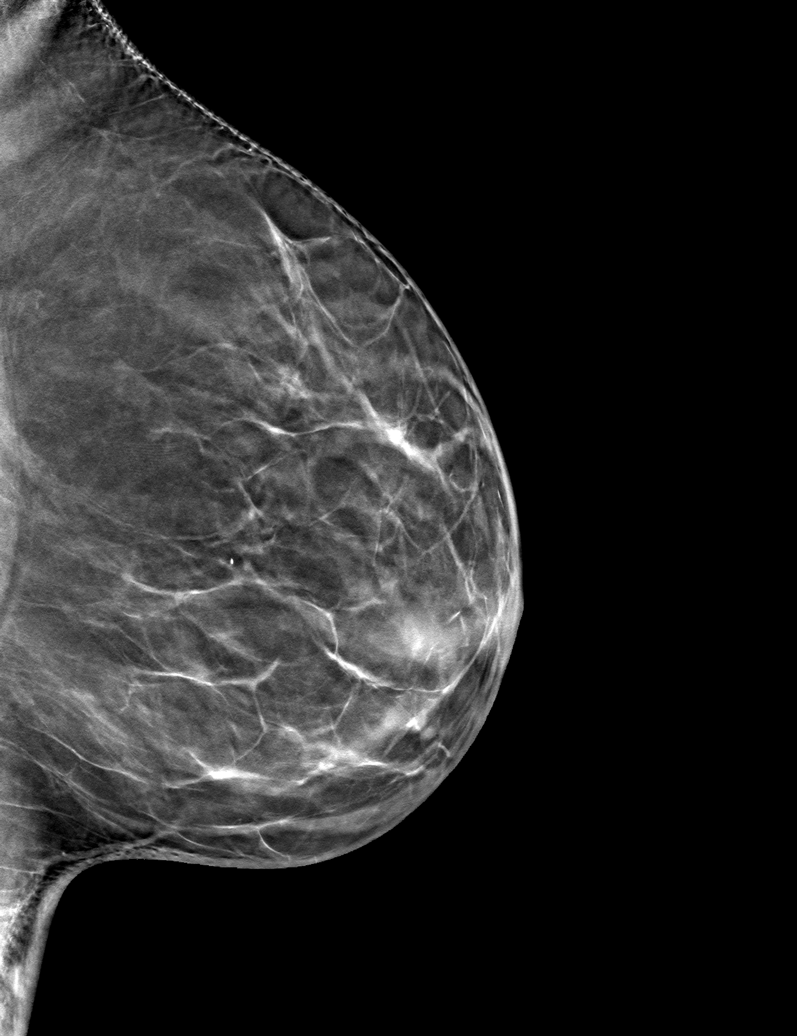

[6 of 30 positions shown; findings below may reference images not displayed]

ACR Breast Density Category b: There are scattered areas of
fibroglandular density.
FINDINGS: There are no findings suspicious for malignancy. Images were
processed with CAD.
IMPRESSION: No mammographic evidence of malignancy. A result letter of this
screening mammogram will be mailed directly to the patient.

RECOMMENDATION:
Screening mammogram in one year. (Code:[id number])

BI-RADS CATEGORY  1: Negative.

## 2021-05-07 ENCOUNTER — Other Ambulatory Visit: Payer: Self-pay

## 2021-05-07 ENCOUNTER — Other Ambulatory Visit: Payer: BC Managed Care – PPO

## 2021-05-07 DIAGNOSIS — E119 Type 2 diabetes mellitus without complications: Secondary | ICD-10-CM

## 2021-05-07 DIAGNOSIS — Z23 Encounter for immunization: Secondary | ICD-10-CM

## 2021-05-08 LAB — SPECIMEN STATUS REPORT

## 2021-05-08 LAB — HGB A1C W/O EAG: Hgb A1c MFr Bld: 5.7 % — ABNORMAL HIGH (ref 4.8–5.6)

## 2021-06-24 ENCOUNTER — Ambulatory Visit (INDEPENDENT_AMBULATORY_CARE_PROVIDER_SITE_OTHER): Payer: BC Managed Care – PPO | Admitting: Dermatology

## 2021-06-24 ENCOUNTER — Other Ambulatory Visit: Payer: Self-pay

## 2021-06-24 ENCOUNTER — Encounter: Payer: Self-pay | Admitting: Dermatology

## 2021-06-24 DIAGNOSIS — B078 Other viral warts: Secondary | ICD-10-CM

## 2021-06-24 DIAGNOSIS — L988 Other specified disorders of the skin and subcutaneous tissue: Secondary | ICD-10-CM | POA: Diagnosis not present

## 2021-06-24 DIAGNOSIS — L710 Perioral dermatitis: Secondary | ICD-10-CM

## 2021-06-24 MED ORDER — DOXYCYCLINE HYCLATE 20 MG PO TABS: 20.0000 mg | ORAL_TABLET | Freq: Two times a day (BID) | ORAL | 2 refills | Status: AC

## 2021-06-24 NOTE — Patient Instructions (Addendum)
Instructions for After In-Office Application of Cantharidin  1. This is a strong medicine; please follow ALL instructions.  2. Gently wash off with soap and water in four hours or sooner s directed by your physician.  3. **WARNING** this medicine can cause severe blistering, blood blisters, infection, and/or scarring if it is not washed off as directed.  4. Your progress will be rechecked in 1-2 months; call sooner if there are any questions or problems.   Cantharidin Plus is a blistering agent that comes from a beetle.  It needs to be washed off in about 4 hours after application.  Although it is painless when applied in office, it may cause symptoms of mild pain and burning several hours later.  Treated areas will swell and turn red, and blisters may form.  Vaseline and a bandaid may be applied until wound has healed.  Once healed, the skin may remain temporarily discolored.  It can take weeks to months for pigmentation to return to normal.  Advised to wash off with soap and water in 4 hours or sooner if it becomes tender before then.  Sample of Zilxi given to patient to use once daily  Lot # [id number]  Exp: July 2023  May start doxycycline 20 mg twice daily with food until clear for at least 2 weeks.   Doxycycline should be taken with food to prevent nausea. Do not lay down for 30 minutes after taking. Be cautious with sun exposure and use good sun protection while on this medication. Pregnant women should not take this medication.    If You Need Anything After Your Visit  If you have any questions or concerns for your doctor, please call our main line at [phone] and press option 4 to reach your doctor's medical assistant. If no one answers, please leave a voicemail as directed and we will return your call as soon as possible. Messages left after 4 pm will be answered the following business day.   You may also send [place] a message via [place]. We typically respond to MyChart messages  within 1-2 business days.  For prescription refills, please ask your pharmacy to contact our office. Our fax number is [phone].  If you have an urgent issue when the clinic is closed that cannot wait until the next business day, you can page your doctor at the number below.    Please note that while we do our best to be available for urgent issues outside of office hours, we are not available 24/7.   If you have an urgent issue and are unable to reach [place], you may choose to seek medical care at your doctor's office, retail clinic, urgent care center, or emergency room.  If you have a medical emergency, please immediately call 911 or go to the emergency department.  Pager Numbers  - Dr. Nehemiah Massed: [phone]  - Dr. Laurence Ferrari: [phone]  - Dr. Nicole Kindred: [phone]  In the event of inclement weather, please call our main line at [phone] for an update on the status of any delays or closures.  Dermatology Medication Tips: Please keep the boxes that topical medications come in in order to help keep track of the instructions about where and how to use these. Pharmacies typically print the medication instructions only on the boxes and not directly on the medication tubes.   If your medication is too expensive, please contact our office at [phone] option 4 or send [place] a message through [place].   We are unable to  tell what your co-pay for medications will be in advance as this is different depending on your insurance coverage. However, we may be able to find a substitute medication at lower cost or fill out paperwork to get insurance to cover a needed medication.   If a prior authorization is required to get your medication covered by your insurance company, please allow [place] 1-2 business days to complete this process.  Drug prices often vary depending on where the prescription is filled and some pharmacies may offer cheaper prices.  The website www.goodrx.com contains coupons for  medications through different pharmacies. The prices here do not account for what the cost may be with help from insurance (it may be cheaper with your insurance), but the website can give you the price if you did not use any insurance.  - You can print the associated coupon and take it with your prescription to the pharmacy.  - You may also stop by our office during regular business hours and pick up a GoodRx coupon card.  - If you need your prescription sent electronically to a different pharmacy, notify our office through [facility] or by phone at [phone] option 4.     Si Usted Necesita Algo Despus de Su Visita  Tambin puede enviarnos un mensaje a travs de Pharmacist, community. Por lo general respondemos a los mensajes de MyChart en el transcurso de 1 a 2 das hbiles.  Para renovar recetas, por favor pida a su farmacia que se ponga en contacto con nuestra oficina. Harland Dingwall de fax es [place] [phone].  Si tiene un asunto urgente cuando la clnica est cerrada y que no puede esperar hasta el siguiente da hbil, puede llamar/localizar a su doctor(a) al nmero que aparece a continuacin.   Por favor, tenga en cuenta que aunque hacemos todo lo posible para estar disponibles para asuntos urgentes fuera del horario de [place], no estamos disponibles las 24 horas del da, los 7 das de la [place].   Si tiene un problema urgente y no puede comunicarse con nosotros, puede optar por buscar atencin mdica  en el consultorio de su doctor(a), en una clnica privada, en un centro de atencin urgente o en una sala de emergencias.  Si tiene Engineering geologist, por favor llame inmediatamente al 911 o vaya a la sala de emergencias.  Nmeros de bper  - Dr. Nehemiah Massed: [phone]  - Dra. Moye: [phone]  - Dra. Nicole Kindred: [phone]  En caso de inclemencias del [place], por favor llame a Johnsie Kindred principal al [phone] para una actualizacin sobre el [place] de cualquier retraso o  cierre.  Consejos para la medicacin en dermatologa: Por favor, guarde las cajas en las que vienen los medicamentos de uso tpico para ayudarle a seguir las instrucciones sobre dnde y cmo usarlos. Las farmacias generalmente imprimen las instrucciones del medicamento slo en las cajas y no directamente en los tubos del [place].   Si su medicamento es muy caro, por favor, pngase en contacto con Zigmund Daniel llamando al [phone] y presione la opcin 4 o envenos un mensaje a travs de Pharmacist, community.   No podemos decirle cul ser su copago por los medicamentos por adelantado ya que esto es diferente dependiendo de la cobertura de su seguro. Sin embargo, es posible que podamos encontrar un medicamento sustituto a Electrical engineer un formulario para que el seguro cubra el medicamento que se considera necesario.   Si se requiere una autorizacin previa para que su compaa de seguros [place]  su medicamento, por favor permtanos de 1 a 2 das hbiles para completar este proceso.  Los precios de los medicamentos varan con frecuencia dependiendo del Environmental consultant de dnde se surte la receta y alguna farmacias pueden ofrecer precios ms baratos.  El sitio web www.goodrx.com tiene cupones para medicamentos de Airline pilot. Los precios aqu no tienen en cuenta lo que podra costar con la ayuda del seguro (puede ser ms barato con su seguro), pero el sitio web puede darle el precio si no utiliz Research scientist (physical sciences).  - Puede imprimir el cupn correspondiente y llevarlo con su receta a la farmacia.  - Tambin puede pasar por nuestra oficina durante el horario de atencin regular y Charity fundraiser una tarjeta de cupones de GoodRx.  - Si necesita que su receta se enve electrnicamente a una farmacia diferente, informe a nuestra oficina a travs de MyChart de [place] o por telfono llamando al [phone] y presione la opcin 4.

## 2021-06-24 NOTE — Progress Notes (Signed)
Follow-Up Visit   Subjective  SHANTEA POULTON is a 60 y.o. female who presents for the following: Facial Elastosis (Patient here today for botox injections. ) and Skin Problem (Patient with a spot at upper back, irritated, present for a few months.).   The following portions of the chart were reviewed this encounter and updated as appropriate:   Tobacco   Allergies   Meds   Problems   Med Hx   Surg Hx   Fam Hx       Review of Systems:  No other skin or systemic complaints except as noted in HPI or Assessment and Plan.  Objective  Well appearing patient in no apparent distress; mood and affect are within normal limits.  A focused examination was performed including face, back. Relevant physical exam findings are noted in the Assessment and Plan.  face Rhytides and volume loss.      Left Upper Back Verrucous papules -- Discussed viral etiology and contagion.   perioral Scattered inflammatory papules at upper lip, perioral   Assessment & Plan  Elastosis of skin face  Filling material injection - face Location: See attached image  Informed consent: Discussed risks (infection, pain, bleeding, bruising, swelling, allergic reaction, paralysis of nearby muscles, eyelid droop, double vision, neck weakness, difficulty breathing, headache, undesirable cosmetic result, and need for additional treatment) and benefits of the procedure, as well as the alternatives.  Informed consent was obtained.  Preparation: The area was cleansed with alcohol.  Procedure Details:  Botox was injected into the dermis with a 30-gauge needle. Pressure applied to any bleeding. Ice packs offered for swelling.  Lot Number:  [id number]  Expiration:  11/2023  Total Units Injected:  33.5  Plan: Tylenol may be used for headache.  Allow 2 weeks before returning to clinic for additional dosing as needed. Patient will call for any problems.   Other viral warts Left Upper Back  Discussed viral etiology and  risk of spread.  Discussed multiple treatments may be required to clear warts.  Discussed possible post-treatment dyspigmentation and risk of recurrence.  Prior to procedure, discussed risks of blister formation, small wound, skin dyspigmentation, or rare scar following cryotherapy. Recommend Vaseline ointment to treated areas while healing.  Squaric Acid 3% applied to warts today. Prior to application reviewed risk of inflammation and irritation.  Cantharidin Plus is a blistering agent that comes from a beetle.  It needs to be washed off in about 4 hours after application.  Although it is painless when applied in office, it may cause symptoms of mild pain and burning several hours later.  Treated areas will swell and turn red, and blisters may form.  Vaseline and a bandaid may be applied until wound has healed.  Once healed, the skin may remain temporarily discolored.  It can take weeks to months for pigmentation to return to normal.  Advised to wash off with soap and water in 4 hours or sooner if it becomes tender before then.   Destruction of lesion - Left Upper Back  Destruction method: cryotherapy   Informed consent: discussed and consent obtained   Lesion destroyed using liquid nitrogen: Yes   Cryotherapy cycles:  2 Outcome: patient tolerated procedure well with no complications   Post-procedure details: wound care instructions given    Destruction of lesion - Left Upper Back  Destruction method: chemical removal   Informed consent: discussed and consent obtained   Timeout:  patient name, date of birth, surgical site, and procedure verified  Chemical destruction method: cantharidin   Chemical destruction method comment:  Cantharidin plus Application time:  4 hours Procedure instructions: patient instructed to wash and dry area   Outcome: patient tolerated procedure well with no complications   Post-procedure details: wound care instructions given    Perioral  dermatitis perioral  Sample of Zilxi given to patient to use once daily  Lot # [id number]  Exp: July 2023  May start doxycycline 20 mg twice daily with food until clear for at least 2 weeks.   Doxycycline should be taken with food to prevent nausea. Do not lay down for 30 minutes after taking. Be cautious with sun exposure and use good sun protection while on this medication. Pregnant women should not take this medication.    doxycycline (PERIOSTAT) 20 MG tablet - perioral Take 1 tablet (20 mg total) by mouth 2 (two) times daily. Take with food  Return for as scheduled.  Graciella Belton, RMA, am acting as scribe for Forest Gleason, MD .  Documentation: I have reviewed the above documentation for accuracy and completeness, and I agree with the above.  Forest Gleason, MD

## 2021-07-15 ENCOUNTER — Other Ambulatory Visit: Payer: Self-pay | Admitting: Primary Care

## 2021-07-15 DIAGNOSIS — I10 Essential (primary) hypertension: Secondary | ICD-10-CM

## 2021-07-15 DIAGNOSIS — K219 Gastro-esophageal reflux disease without esophagitis: Secondary | ICD-10-CM

## 2021-07-29 ENCOUNTER — Other Ambulatory Visit: Payer: Self-pay

## 2021-07-29 ENCOUNTER — Ambulatory Visit: Payer: BC Managed Care – PPO | Admitting: Dermatology

## 2021-07-29 DIAGNOSIS — B078 Other viral warts: Secondary | ICD-10-CM

## 2021-07-29 DIAGNOSIS — L710 Perioral dermatitis: Secondary | ICD-10-CM

## 2021-07-29 DIAGNOSIS — L988 Other specified disorders of the skin and subcutaneous tissue: Secondary | ICD-10-CM

## 2021-07-29 MED ORDER — ZILXI 1.5 % EX FOAM: 1.0000 "application " | Freq: Every day | CUTANEOUS | 1 refills | Status: DC

## 2021-07-29 NOTE — Progress Notes (Addendum)
° °  Follow-Up Visit   Subjective  Michelle Figueroa is a 60 y.o. female who presents for the following: Follow-up (Patient here today for wart and perioral dermatitis follow up. Wart at back previously treated with LN2 and cantharidin, perioral dermatitis was treated with Zilixi. She was given a rx for doxycycline but never took it. ).  The following portions of the chart were reviewed this encounter and updated as appropriate:   Tobacco   Allergies   Meds   Problems   Med Hx   Surg Hx   Fam Hx       Review of Systems:  No other skin or systemic complaints except as noted in HPI or Assessment and Plan.  Objective  Well appearing patient in no apparent distress; mood and affect are within normal limits.  A focused examination was performed including face, upper back. Relevant physical exam findings are noted in the Assessment and Plan.  Left Upper Back Clear   perioral Few faint resolving erythematous papules  face Rhytides and volume loss.          Assessment & Plan  Other viral warts Left Upper Back  Discussed viral etiology and risk of spread.  Discussed multiple treatments may be required to clear warts.  Discussed possible post-treatment dyspigmentation and risk of recurrence.   Monitor for recurrence  Perioral dermatitis perioral  Nearly clear  May continue Zilixi once daily.  Patient has rx for doxycycline 20 mg. May take twice daily with food if needed.   Doxycycline should be taken with food to prevent nausea. Do not lay down for 30 minutes after taking. Be cautious with sun exposure and use good sun protection while on this medication. Pregnant women should not take this medication.    Minocycline HCl Micronized (ZILXI) 1.5 % FOAM - perioral Apply 1 application topically daily.  Elastosis of skin face  Will plan to increase botox to 10 units total at forehead and 3.5 at lateral corrugators at f/u   Return for Botox, as scheduled.  Graciella Belton, RMA, am acting as scribe for Forest Gleason, MD .  Documentation: I have reviewed the above documentation for accuracy and completeness, and I agree with the above.  Forest Gleason, MD

## 2021-07-29 NOTE — Patient Instructions (Addendum)
May continue Zilixi once daily. Patient has rx for doxycycline 20 mg. May take twice daily with food if needed.   Doxycycline should be taken with food to prevent nausea. Do not lay down for 30 minutes after taking. Be cautious with sun exposure and use good sun protection while on this medication. Pregnant women should not take this medication.   If You Need Anything After Your Visit  If you have any questions or concerns for your doctor, please call our main line at [phone] and press option 4 to reach your doctor's medical assistant. If no one answers, please leave a voicemail as directed and we will return your call as soon as possible. Messages left after 4 pm will be answered the following business day.   You may also send [place] a message via [place]. We typically respond to MyChart messages within 1-2 business days.  For prescription refills, please ask your pharmacy to contact our office. Our fax number is [phone].  If you have an urgent issue when the clinic is closed that cannot wait until the next business day, you can page your doctor at the number below.    Please note that while we do our best to be available for urgent issues outside of office hours, we are not available 24/7.   If you have an urgent issue and are unable to reach [place], you may choose to seek medical care at your doctor's office, retail clinic, urgent care center, or emergency room.  If you have a medical emergency, please immediately call 911 or go to the emergency department.  Pager Numbers  - Dr. Nehemiah Massed: [phone]  - Dr. Laurence Ferrari: [phone]  - Dr. Nicole Kindred: [phone]  In the event of inclement weather, please call our main line at [phone] for an update on the status of any delays or closures.  Dermatology Medication Tips: Please keep the boxes that topical medications come in in order to help keep track of the instructions about where and how to use these. Pharmacies typically print the  medication instructions only on the boxes and not directly on the medication tubes.   If your medication is too expensive, please contact our office at [phone] option 4 or send [place] a message through [place].   We are unable to tell what your co-pay for medications will be in advance as this is different depending on your insurance coverage. However, we may be able to find a substitute medication at lower cost or fill out paperwork to get insurance to cover a needed medication.   If a prior authorization is required to get your medication covered by your insurance company, please allow [place] 1-2 business days to complete this process.  Drug prices often vary depending on where the prescription is filled and some pharmacies may offer cheaper prices.  The website www.goodrx.com contains coupons for medications through different pharmacies. The prices here do not account for what the cost may be with help from insurance (it may be cheaper with your insurance), but the website can give you the price if you did not use any insurance.  - You can print the associated coupon and take it with your prescription to the pharmacy.  - You may also stop by our office during regular business hours and pick up a GoodRx coupon card.  - If you need your prescription sent electronically to a different pharmacy, notify our office through [facility] or by phone at [phone] option 4.     Si  Usted Necesita Algo Despus de Su Visita  Tambin puede enviarnos un mensaje a travs de Pharmacist, community. Por lo general respondemos a los mensajes de MyChart en el transcurso de 1 a 2 das hbiles.  Para renovar recetas, por favor pida a su farmacia que se ponga en contacto con nuestra oficina. Harland Dingwall de fax es [place] [phone].  Si tiene un asunto urgente cuando la clnica est cerrada y que no puede esperar hasta el siguiente da hbil, puede llamar/localizar a su doctor(a) al nmero que aparece a continuacin.    Por favor, tenga en cuenta que aunque hacemos todo lo posible para estar disponibles para asuntos urgentes fuera del horario de [place], no estamos disponibles las 24 horas del da, los 7 das de la [place].   Si tiene un problema urgente y no puede comunicarse con nosotros, puede optar por buscar atencin mdica  en el consultorio de su doctor(a), en una clnica privada, en un centro de atencin urgente o en una sala de emergencias.  Si tiene Engineering geologist, por favor llame inmediatamente al 911 o vaya a la sala de emergencias.  Nmeros de bper  - Dr. Nehemiah Massed: [phone]  - Dra. Moye: [phone]  - Dra. Nicole Kindred: [phone]  En caso de inclemencias del [place], por favor llame a Johnsie Kindred principal al [phone] para una actualizacin sobre el [place] de cualquier retraso o cierre.  Consejos para la medicacin en dermatologa: Por favor, guarde las cajas en las que vienen los medicamentos de uso tpico para ayudarle a seguir las instrucciones sobre dnde y cmo usarlos. Las farmacias generalmente imprimen las instrucciones del medicamento slo en las cajas y no directamente en los tubos del [place].   Si su medicamento es muy caro, por favor, pngase en contacto con Zigmund Daniel llamando al [phone] y presione la opcin 4 o envenos un mensaje a travs de Pharmacist, community.   No podemos decirle cul ser su copago por los medicamentos por adelantado ya que esto es diferente dependiendo de la cobertura de su seguro. Sin embargo, es posible que podamos encontrar un medicamento sustituto a Electrical engineer un formulario para que el seguro cubra el medicamento que se considera necesario.   Si se requiere una autorizacin previa para que su compaa de seguros [place] su medicamento, por favor permtanos de 1 a 2 das hbiles para completar este proceso.  Los precios de los medicamentos varan con frecuencia dependiendo del Environmental consultant de dnde se surte la receta y alguna  farmacias pueden ofrecer precios ms baratos.  El sitio web www.goodrx.com tiene cupones para medicamentos de Airline pilot. Los precios aqu no tienen en cuenta lo que podra costar con la ayuda del seguro (puede ser ms barato con su seguro), pero el sitio web puede darle el precio si no utiliz Research scientist (physical sciences).  - Puede imprimir el cupn correspondiente y llevarlo con su receta a la farmacia.  - Tambin puede pasar por nuestra oficina durante el horario de atencin regular y Charity fundraiser una tarjeta de cupones de GoodRx.  - Si necesita que su receta se enve electrnicamente a una farmacia diferente, informe a nuestra oficina a travs de MyChart de [place] o por telfono llamando al [phone] y presione la opcin 4.

## 2021-08-07 ENCOUNTER — Encounter: Payer: Self-pay | Admitting: Dermatology

## 2021-08-12 ENCOUNTER — Other Ambulatory Visit: Payer: Self-pay

## 2021-08-12 ENCOUNTER — Encounter: Payer: Self-pay | Admitting: Nurse Practitioner

## 2021-08-12 ENCOUNTER — Ambulatory Visit: Payer: BC Managed Care – PPO | Admitting: Nurse Practitioner

## 2021-08-12 VITALS — BP 124/88 | HR 108 | Temp 100.2°F | Resp 18

## 2021-08-12 DIAGNOSIS — Z20822 Contact with and (suspected) exposure to covid-19: Secondary | ICD-10-CM

## 2021-08-12 DIAGNOSIS — U071 COVID-19: Secondary | ICD-10-CM

## 2021-08-12 DIAGNOSIS — Z1152 Encounter for screening for COVID-19: Secondary | ICD-10-CM

## 2021-08-12 DIAGNOSIS — R6889 Other general symptoms and signs: Secondary | ICD-10-CM

## 2021-08-12 LAB — POCT INFLUENZA A/B
Influenza A, POC: NEGATIVE
Influenza B, POC: NEGATIVE

## 2021-08-12 LAB — POC COVID19 BINAXNOW: SARS Coronavirus 2 Ag: POSITIVE — AB

## 2021-08-12 NOTE — Progress Notes (Signed)
History of Present Illness: Michelle Figueroa is a 61 y.o. who identifies as a female who was assigned female at birth, and is being seen today with complaints of a cough, headache and congestion that started last night. She has a low grade fever today as well. She does have a back ache as well. She works in [employer] at [employer]-  She was able to take a home COVID test and was negative.   She has had a flu vaccine this year- has not had flu this year.   She did have a bloody nose two days ago after blowing her nose.   She has not had COVID in the past.  She has been vaccinated x4 for COVID as well.    Problems:  Patient Active Problem List   Diagnosis Date Noted   Rash and nonspecific skin eruption 01/30/2020   Vitamin D deficiency 05/31/2019   Class 1 obesity due to excess calories without serious comorbidity with body mass index (BMI) of 31.0 to 31.9 in adult 03/20/2019   Family history of early CAD 03/20/2019   Type 2 diabetes mellitus without complication, without long-term current use of insulin ([place]) 05/16/2018   Hemorrhoid 02/10/2016   Preventative health care 02/10/2016   Hyperlipidemia 02/10/2016   GAD (generalized anxiety disorder) 02/10/2016   Essential hypertension 11/10/2015   Obesity (BMI 30.0-34.9) 11/10/2015   GERD (gastroesophageal reflux disease) 11/10/2015    Allergies:  Allergies  Allergen Reactions   Erythromycin Ethylsuccinate Rash   Oxycodone-Acetaminophen Nausea Only   Medications:  Current Outpatient Medications:    atorvastatin (LIPITOR) 10 MG tablet, TAKE 1 TABLET BY MOUTH DAILY FOR CHOLESTEROL, Disp: 90 tablet, Rfl: 2   estradiol (ESTRACE VAGINAL) 0.1 MG/GM vaginal cream, 1 gram pv twice weekly, Disp: 42.5 g, Rfl: 3   lisinopril (ZESTRIL) 30 MG tablet, Take 1 tablet (30 mg total) by mouth daily. for blood pressure. Due in March for office visit., Disp: 60 tablet, Rfl: 0   Minocycline HCl Micronized (ZILXI) 1.5 % FOAM, Apply 1 application  topically daily., Disp: 30 g, Rfl: 1   mometasone (ELOCON) 0.1 % cream, Apply to itchy areas on chest QD-BID PRN. Topical steroids (such as triamcinolone, fluocinolone, fluocinonide, mometasone, clobetasol, halobetasol, betamethasone, hydrocortisone) can cause thinning and lightening of the skin if they are used for too long in the same area. Your physician has selected the right strength medicine for your problem and area affected on the body. Please use your medication only as directed by your physician to prevent side effects., Disp: 50 g, Rfl: 0   Multiple Vitamin (MULTIVITAMIN) tablet, Take 1 tablet by mouth daily., Disp: , Rfl:    omeprazole (PRILOSEC) 20 MG capsule, Take 1 capsule (20 mg total) by mouth daily. For heartburn. Due in March for office visit., Disp: 60 capsule, Rfl: 0   sertraline (ZOLOFT) 100 MG tablet, TAKE 1 TABLET BY MOUTH DAILY FOR ANXIETY., Disp: 90 tablet, Rfl: 2   VITAMIN D PO, Take by mouth., Disp: , Rfl:   Observations/Objective: Patient is well-developed, well-nourished in no acute distress.  Resting comfortably at home.  Head is normocephalic, atraumatic.  No labored breathing.  Speech is clear and coherent with logical content.  Patient is alert and oriented at baseline.   Recent Results (from the past 2160 hour(s))  POC COVID-19     Status: Abnormal   Collection Time: 08/12/21  5:18 PM  Result Value Ref Range   SARS Coronavirus 2 Ag Positive (A) Negative  Assessment and Plan: 1. COVID-19 Return to work in 08/17/21 as long as symptoms are improving.   Advised OTC Cold/flu medication for symptom management  Push fluid & rest assure caloric intake for recovery as discussed   RTC with new or worsening symptoms     Work note sent to [place], FNP

## 2021-08-17 ENCOUNTER — Other Ambulatory Visit: Payer: Self-pay

## 2021-08-17 ENCOUNTER — Ambulatory Visit: Payer: BC Managed Care – PPO | Admitting: Medical

## 2021-08-17 ENCOUNTER — Other Ambulatory Visit: Payer: Self-pay | Admitting: Primary Care

## 2021-08-17 DIAGNOSIS — R062 Wheezing: Secondary | ICD-10-CM

## 2021-08-17 DIAGNOSIS — E781 Pure hyperglyceridemia: Secondary | ICD-10-CM

## 2021-08-17 DIAGNOSIS — J9801 Acute bronchospasm: Secondary | ICD-10-CM

## 2021-08-17 DIAGNOSIS — R051 Acute cough: Secondary | ICD-10-CM

## 2021-08-17 DIAGNOSIS — J069 Acute upper respiratory infection, unspecified: Secondary | ICD-10-CM

## 2021-08-17 MED ORDER — PREDNISONE 10 MG (21) PO TBPK: ORAL_TABLET | ORAL | 0 refills | Status: DC

## 2021-08-17 MED ORDER — ALBUTEROL SULFATE HFA 108 (90 BASE) MCG/ACT IN AERS: 2.0000 | INHALATION_SPRAY | Freq: Four times a day (QID) | RESPIRATORY_TRACT | 0 refills | Status: DC | PRN

## 2021-08-17 MED ORDER — BENZONATATE 100 MG PO CAPS: ORAL_CAPSULE | ORAL | 0 refills | Status: DC

## 2021-08-17 MED ORDER — DOXYCYCLINE HYCLATE 100 MG PO TABS: 100.0000 mg | ORAL_TABLET | Freq: Two times a day (BID) | ORAL | 0 refills | Status: DC

## 2021-08-17 NOTE — Patient Instructions (Signed)

## 2021-08-17 NOTE — Progress Notes (Signed)
Called patient she consents to telemedicine appointment.  Patient feels worse. Fatigue is her main complaint. She feels the infection is now in her chest. Dry cough. Does have coughing  "spells". No fever since last appointment. And she also has heard some wheezing in her chest.  Clear discharge from nose. No fever since last Wednesday.  Wrote for a antibiotic if she runs a fever   over 100.5 , green discharge from nose or coughing green mucous.  She also needs an updated work note. Sent to patient. She verbalizes understanding and has no questions at discharge.  Meds ordered this encounter  Medications   benzonatate (TESSALON PERLES) 100 MG capsule    Sig: Take one to two capsules by mouth every 8 hours as needed for cough, swallow whole.    Dispense:  30 capsule    Refill:  0   albuterol (VENTOLIN HFA) 108 (90 Base) MCG/ACT inhaler    Sig: Inhale 2 puffs into the lungs every 6 (six) hours as needed for wheezing or shortness of breath.    Dispense:  8 g    Refill:  0   predniSONE (STERAPRED UNI-PAK 21 TAB) 10 MG (21) TBPK tablet    Sig: Take  6 tablets today by mouth then 5 tablets tomorrow, then one less tablet every day thereafter. Take with food.    Dispense:  21 tablet    Refill:  0   doxycycline (VIBRA-TABS) 100 MG tablet    Sig: Take 1 tablet (100 mg total) by mouth 2 (two) times daily.    Dispense:  14 tablet    Refill:  0

## 2021-08-19 ENCOUNTER — Ambulatory Visit: Payer: BC Managed Care – PPO | Admitting: Primary Care

## 2021-08-25 ENCOUNTER — Ambulatory Visit: Payer: BC Managed Care – PPO | Admitting: Primary Care

## 2021-08-25 ENCOUNTER — Encounter: Payer: Self-pay | Admitting: Primary Care

## 2021-08-25 ENCOUNTER — Other Ambulatory Visit: Payer: Self-pay

## 2021-08-25 DIAGNOSIS — R053 Chronic cough: Secondary | ICD-10-CM | POA: Insufficient documentation

## 2021-08-25 DIAGNOSIS — R051 Acute cough: Secondary | ICD-10-CM | POA: Insufficient documentation

## 2021-08-25 HISTORY — DX: Chronic cough: R05.3

## 2021-08-25 MED ORDER — BENZONATATE 200 MG PO CAPS
200.0000 mg | ORAL_CAPSULE | Freq: Three times a day (TID) | ORAL | 0 refills | Status: DC | PRN
Start: 2021-08-25 — End: 2021-10-23

## 2021-08-25 NOTE — Patient Instructions (Signed)
You may take Benzonatate capsules for cough. Take 1 capsule by mouth three times daily as needed for cough.  Please reach out if your cough progresses.  It was a pleasure to see you today!

## 2021-08-25 NOTE — Assessment & Plan Note (Signed)
Since Covid-19 infection 14 days ago. Today she appears well overall, does have a congested cough on exam but otherwise appears well.  Discussed lingering symptoms from Covid-19 including duration. We also discussed that lisinopril could aggravate the cough and to notify me if that is the case.   Rx for Tessalon Perles 200 mg sent to pharmacy per patient request. Return precautions provided.

## 2021-08-25 NOTE — Progress Notes (Signed)
Subjective:    Patient ID: Michelle Figueroa, female    DOB: Apr 26, 1961, 61 y.o.   MRN: [id number]  HPI  Michelle Figueroa is a very pleasant 61 y.o. female with a history of hypertension, GERD, Covid-19 infection who presents today to discuss cough.   Her cough began 14 days ago, diagnosed with Covid-19 infection at the time. Evaluated at the [facility] [place] 6 days after cough began (08/17/21), was prescribed doxycycline, Tessalon Perles, Prednisone dose pack, albuterol inhaler. Noticed some improvement.   Today she endorses continued cough, chest congestion, head congestion, fatigue, mild and intermittent shortness of breath.   Managed on ACE-I, lisinopril for hypertension, she does have a dry/tickle cough at times which has not worsened since contracting Covid-19. She's currently taking Tessalon Perles only for cough, temporary improvement.    Review of Systems  Constitutional:  Positive for fatigue. Negative for chills and fever.  HENT:  Positive for congestion. Negative for sinus pressure.   Respiratory:  Positive for cough.   Neurological:  Negative for headaches.        Past Medical History:  Diagnosis Date   Diabetes mellitus without complication ([place])    followed by PCP   Diverticulosis    noted on colonoscopy   Essential hypertension    GERD (gastroesophageal reflux disease)    Hemorrhoids    Insomnia     Social History   Socioeconomic History   Marital status: Married    Spouse name: Not on file   Number of children: Not on file   Years of education: Not on file   Highest education level: Not on file  Occupational History   Not on file  Tobacco Use   Smoking status: Never   Smokeless tobacco: Never  Vaping Use   Vaping Use: Never used  Substance and Sexual Activity   Alcohol use: Yes    Alcohol/week: 3.0 standard drinks    Types: 3 Standard drinks or equivalent per week   Drug use: No   Sexual activity: Yes    Birth control/protection:  Post-menopausal  Other Topics Concern   Not on file  Social History Narrative   Married.   2 children.   Works in [employer] at [employer].    Enjoys traveling, spending time with family.    Social Determinants of Health   Financial Resource Strain: Not on file  Food Insecurity: Not on file  Transportation Needs: Not on file  Physical Activity: Not on file  Stress: Not on file  Social Connections: Not on file  Intimate Partner Violence: Not on file    Past Surgical History:  Procedure Laterality Date   APPENDECTOMY  2009   [place]   [facility] Suspension Arthroplasty Left 12/25/2020   left thumb with tight rope and tendon interposition   ENDOMETRIAL ABLATION     GYNECOLOGIC CRYOSURGERY     RETROPUBIC SLING  10/2009   Dr. Davis Gourd at [place]  11/19/2009   with cysto due to urinary retension    Family History  Problem Relation Age of Onset   Arthritis Mother    Diabetes Mother    Colon cancer Mother 10   Lung cancer Father    Hypertension Father    Heart attack Father    Heart attack Brother 7   Breast cancer Neg Hx     Allergies  Allergen Reactions  Erythromycin Ethylsuccinate Rash   Oxycodone-Acetaminophen Nausea Only    Current Outpatient Medications on File Prior to Visit  Medication Sig Dispense Refill   albuterol (VENTOLIN HFA) 108 (90 Base) MCG/ACT inhaler Inhale 2 puffs into the lungs every 6 (six) hours as needed for wheezing or shortness of breath. 8 g 0   atorvastatin (LIPITOR) 10 MG tablet Take 1 tablet (10 mg total) by mouth daily. For cholesterol. Office visit required for further refills. 30 tablet 0   estradiol (ESTRACE VAGINAL) 0.1 MG/GM vaginal cream 1 gram pv twice weekly 42.5 g 3   lisinopril (ZESTRIL) 30 MG tablet Take 1 tablet (30 mg total) by mouth daily. for blood pressure. Due in March for office visit. 60 tablet 0   Minocycline HCl  Micronized (ZILXI) 1.5 % FOAM Apply 1 application topically daily. 30 g 1   mometasone (ELOCON) 0.1 % cream Apply to itchy areas on chest QD-BID PRN. Topical steroids (such as triamcinolone, fluocinolone, fluocinonide, mometasone, clobetasol, halobetasol, betamethasone, hydrocortisone) can cause thinning and lightening of the skin if they are used for too long in the same area. Your physician has selected the right strength medicine for your problem and area affected on the body. Please use your medication only as directed by your physician to prevent side effects. 50 g 0   Multiple Vitamin (MULTIVITAMIN) tablet Take 1 tablet by mouth daily.     omeprazole (PRILOSEC) 20 MG capsule Take 1 capsule (20 mg total) by mouth daily. For heartburn. Due in March for office visit. 60 capsule 0   sertraline (ZOLOFT) 100 MG tablet TAKE 1 TABLET BY MOUTH DAILY FOR ANXIETY. 90 tablet 2   VITAMIN D PO Take by mouth.     No current facility-administered medications on file prior to visit.    BP 118/82 (BP Location: Left Arm, Patient Position: Sitting, Cuff Size: Normal)    Pulse 93    Temp (!) 97.2 F (36.2 C) (Temporal)    Ht 5\' 5"  (1.651 m)    Wt 188 lb (85.3 kg)    SpO2 98%    BMI 31.28 kg/m  Objective:   Physical Exam Cardiovascular:     Rate and Rhythm: Normal rate and regular rhythm.  Pulmonary:     Effort: Pulmonary effort is normal.     Breath sounds: Normal breath sounds. No wheezing or rhonchi.     Comments: Congested cough noted during visit.  Musculoskeletal:     Cervical back: Neck supple.  Skin:    General: Skin is warm and dry.          Assessment & Plan:      This visit occurred during the SARS-CoV-2 public health emergency.  Safety protocols were in place, including screening questions prior to the visit, additional usage of staff PPE, and extensive cleaning of exam room while observing appropriate contact time as indicated for disinfecting solutions.

## 2021-09-04 ENCOUNTER — Encounter: Payer: Self-pay | Admitting: Primary Care

## 2021-09-09 ENCOUNTER — Ambulatory Visit: Payer: BC Managed Care – PPO | Admitting: Primary Care

## 2021-09-17 ENCOUNTER — Other Ambulatory Visit: Payer: Self-pay | Admitting: Primary Care

## 2021-09-17 DIAGNOSIS — I10 Essential (primary) hypertension: Secondary | ICD-10-CM

## 2021-09-21 ENCOUNTER — Other Ambulatory Visit: Payer: Self-pay | Admitting: Primary Care

## 2021-09-21 DIAGNOSIS — E781 Pure hyperglyceridemia: Secondary | ICD-10-CM

## 2021-09-21 DIAGNOSIS — K219 Gastro-esophageal reflux disease without esophagitis: Secondary | ICD-10-CM

## 2021-09-21 NOTE — Telephone Encounter (Signed)
Needs CPE scheduled and to be seen ASAP. ?

## 2021-09-24 NOTE — Telephone Encounter (Signed)
Appointment made but can not come in until 4/14 ?

## 2021-10-08 ENCOUNTER — Other Ambulatory Visit: Payer: Self-pay | Admitting: Primary Care

## 2021-10-08 DIAGNOSIS — F411 Generalized anxiety disorder: Secondary | ICD-10-CM

## 2021-10-09 NOTE — Telephone Encounter (Signed)
She should have enough Zoloft to last through late May. ?Does she? We are meeting on 04/14.  ?

## 2021-10-12 NOTE — Telephone Encounter (Signed)
Left message on voicemail to call the office back. 

## 2021-10-20 ENCOUNTER — Other Ambulatory Visit: Payer: Self-pay | Admitting: Primary Care

## 2021-10-20 DIAGNOSIS — I10 Essential (primary) hypertension: Secondary | ICD-10-CM

## 2021-10-21 NOTE — Telephone Encounter (Signed)
Left message to return call to our office.  

## 2021-10-23 ENCOUNTER — Encounter: Payer: Self-pay | Admitting: Primary Care

## 2021-10-23 ENCOUNTER — Other Ambulatory Visit: Payer: Self-pay | Admitting: Primary Care

## 2021-10-23 ENCOUNTER — Ambulatory Visit (INDEPENDENT_AMBULATORY_CARE_PROVIDER_SITE_OTHER): Payer: BC Managed Care – PPO | Admitting: Primary Care

## 2021-10-23 VITALS — BP 120/78 | HR 96 | Temp 98.6°F | Ht 65.0 in | Wt 189.0 lb

## 2021-10-23 DIAGNOSIS — K219 Gastro-esophageal reflux disease without esophagitis: Secondary | ICD-10-CM

## 2021-10-23 DIAGNOSIS — E7800 Pure hypercholesterolemia, unspecified: Secondary | ICD-10-CM

## 2021-10-23 DIAGNOSIS — Z0000 Encounter for general adult medical examination without abnormal findings: Secondary | ICD-10-CM

## 2021-10-23 DIAGNOSIS — Z8249 Family history of ischemic heart disease and other diseases of the circulatory system: Secondary | ICD-10-CM

## 2021-10-23 DIAGNOSIS — F411 Generalized anxiety disorder: Secondary | ICD-10-CM | POA: Diagnosis not present

## 2021-10-23 DIAGNOSIS — E1165 Type 2 diabetes mellitus with hyperglycemia: Secondary | ICD-10-CM

## 2021-10-23 DIAGNOSIS — I10 Essential (primary) hypertension: Secondary | ICD-10-CM | POA: Diagnosis not present

## 2021-10-23 LAB — CBC
HCT: 43.5 % (ref 36.0–46.0)
Hemoglobin: 14.8 g/dL (ref 12.0–15.0)
MCHC: 34 g/dL (ref 30.0–36.0)
MCV: 86.7 fl (ref 78.0–100.0)
Platelets: 150 10*3/uL (ref 150.0–400.0)
RBC: 5.02 Mil/uL (ref 3.87–5.11)
RDW: 13.8 % (ref 11.5–15.5)
WBC: 4.9 10*3/uL (ref 4.0–10.5)

## 2021-10-23 LAB — COMPREHENSIVE METABOLIC PANEL
ALT: 50 U/L — ABNORMAL HIGH (ref 0–35)
AST: 52 U/L — ABNORMAL HIGH (ref 0–37)
Albumin: 4.4 g/dL (ref 3.5–5.2)
Alkaline Phosphatase: 106 U/L (ref 39–117)
BUN: 13 mg/dL (ref 6–23)
CO2: 28 mEq/L (ref 19–32)
Calcium: 9.7 mg/dL (ref 8.4–10.5)
Chloride: 104 mEq/L (ref 96–112)
Creatinine, Ser: 0.79 mg/dL (ref 0.40–1.20)
GFR: 81.86 mL/min (ref >60.00)
Glucose, Bld: 98 mg/dL (ref 70–99)
Potassium: 4 mEq/L (ref 3.5–5.1)
Sodium: 139 mEq/L (ref 135–145)
Total Bilirubin: 0.7 mg/dL (ref 0.2–1.2)
Total Protein: 7 g/dL (ref 6.0–8.3)

## 2021-10-23 LAB — LIPID PANEL
Cholesterol: 141 mg/dL (ref 0–200)
HDL: 48.8 mg/dL (ref >39.00)
LDL Cholesterol: 56 mg/dL (ref 0–99)
NonHDL: 92.05
Total CHOL/HDL Ratio: 3
Triglycerides: 180 mg/dL — ABNORMAL HIGH (ref 0.0–149.0)
VLDL: 36 mg/dL (ref 0.0–40.0)

## 2021-10-23 LAB — HEMOGLOBIN A1C: Hgb A1c MFr Bld: 5.8 % (ref 4.6–6.5)

## 2021-10-23 MED ORDER — PRAVASTATIN SODIUM 40 MG PO TABS: 40.0000 mg | ORAL_TABLET | Freq: Every day | ORAL | 3 refills | Status: DC

## 2021-10-23 NOTE — Assessment & Plan Note (Signed)
Myalgias with atorvastatin 10 mg. ? ?Consider switching to pravastatin 40 mg.  ?Repeat lipid panel pending.  ? ?

## 2021-10-23 NOTE — Assessment & Plan Note (Signed)
Controlled.  Continue sertraline 100 mg daily.   

## 2021-10-23 NOTE — Progress Notes (Signed)
? ?Subjective:  ? ? Patient ID: Michelle Figueroa, female    DOB: 31-Mar-1961, 61 y.o.   MRN: [id number] ? ?HPI ? ?Michelle Figueroa is a very pleasant 61 y.o. female who presents today for complete physical and follow up of chronic conditions. ? ?Immunizations: ?-Tetanus: 2017 ?-Influenza: Completed last season ?-Covid-19: 3 vaccine ?-Shingles: Completed Shingrix 1 dose in March 2020 ?-Pneumonia: Pneumovax 23 in 2020 ? ?Diet: Fair diet.  ?Exercise: No regular exercise. ? ?Eye exam: Completes annually  ?Dental exam: Completes semi-annually  ? ?Pap Smear: Completed per GYN ?Mammogram: Completed in September 2022 ?Colonoscopy: Completed in 2020, due December 2023 ? ? ?BP Readings from Last 3 Encounters:  ?10/23/21 120/78  ?08/25/21 118/82  ?08/12/21 124/88  ? ? ? ? ? ? ?Review of Systems  ?Constitutional:  Negative for unexpected weight change.  ?HENT:  Negative for rhinorrhea.   ?Eyes:  Negative for visual disturbance.  ?Respiratory:  Negative for cough and shortness of breath.   ?Cardiovascular:  Negative for chest pain.  ?Gastrointestinal:  Negative for constipation and diarrhea.  ?Genitourinary:  Negative for difficulty urinating.  ?Musculoskeletal:  Positive for myalgias. Negative for arthralgias.  ?Skin:  Negative for rash.  ?Allergic/Immunologic: Negative for environmental allergies.  ?Neurological:  Negative for dizziness, numbness and headaches.  ?Psychiatric/Behavioral:  The patient is not nervous/anxious.   ? ?   ? ? ?Past Medical History:  ?Diagnosis Date  ? Diabetes mellitus without complication ([place])   ? followed by PCP  ? Diverticulosis   ? noted on colonoscopy  ? Essential hypertension   ? GERD (gastroesophageal reflux disease)   ? Hemorrhoids   ? Insomnia   ? ? ?Social History  ? ?Socioeconomic History  ? Marital status: Married  ?  Spouse name: Not on file  ? Number of children: Not on file  ? Years of education: Not on file  ? Highest education level: Not on file  ?Occupational History  ? Not on file   ?Tobacco Use  ? Smoking status: Never  ? Smokeless tobacco: Never  ?Vaping Use  ? Vaping Use: Never used  ?Substance and Sexual Activity  ? Alcohol use: Yes  ?  Alcohol/week: 3.0 standard drinks  ?  Types: 3 Standard drinks or equivalent per week  ? Drug use: No  ? Sexual activity: Yes  ?  Birth control/protection: Post-menopausal  ?Other Topics Concern  ? Not on file  ?Social History Narrative  ? Married.  ? 2 children.  ? Works in [employer] at [employer].   ? Enjoys traveling, spending time with family.   ? ?Social Determinants of Health  ? ?Financial Resource Strain: Not on file  ?Food Insecurity: Not on file  ?Transportation Needs: Not on file  ?Physical Activity: Not on file  ?Stress: Not on file  ?Social Connections: Not on file  ?Intimate Partner Violence: Not on file  ? ? ?Past Surgical History:  ?Procedure Laterality Date  ? APPENDECTOMY  2009  ? CERVICAL FUSION  2010  ? CESAREAN SECTION    ? CHOLECYSTECTOMY  2000  ? [facility] Suspension Arthroplasty Left 12/25/2020  ? left thumb with tight rope and tendon interposition  ? ENDOMETRIAL ABLATION    ? GYNECOLOGIC CRYOSURGERY    ? RETROPUBIC SLING  10/2009  ? Dr[facility] Davis G[facility] at [facility]  ? REVISION URINARY SLING  11/19/2009  ? with cysto due to urinary retension  ? ? ?Family History  ?Problem Relation Age of Onset  ? Arthritis Mother   ?  Diabetes Mother   ? Colon cancer Mother 68  ? Lung cancer Father   ? Hypertension Father   ? Heart attack Father   ? Heart attack Brother 80  ? Breast cancer Neg Hx   ? ? ?Allergies  ?Allergen Reactions  ? Erythromycin Ethylsuccinate Rash  ? Oxycodone-Acetaminophen Nausea Only  ? ? ?Current Outpatient Medications on File Prior to Visit  ?Medication Sig Dispense Refill  ? atorvastatin (LIPITOR) 10 MG tablet TAKE 1 TABLET BY MOUTH DAILY FOR CHOLESTEROL. **OFFICE VISIT REQUIRED FORFUTURE REFILLS** 30 tablet 0  ? estradiol (ESTRACE VAGINAL) 0.1 MG/GM vaginal cream 1 gram pv twice weekly 42.5 g 3  ? lisinopril (ZESTRIL) 30  MG tablet Take 1 tablet (30 mg total) by mouth daily. for blood pressure. Office visit required for further refills. 30 tablet 0  ? Multiple Vitamin (MULTIVITAMIN) tablet Take 1 tablet by mouth daily.    ? omeprazole (PRILOSEC) 20 MG capsule Take 1 capsule (20 mg total) by mouth daily. For heartburn 30 capsule 0  ? sertraline (ZOLOFT) 100 MG tablet TAKE 1 TABLET BY MOUTH DAILY FOR ANXIETY. 90 tablet 2  ? VITAMIN D PO Take by mouth.    ? ?No current facility-administered medications on file prior to visit.  ? ? ?BP 120/78   Pulse 96   Temp 98.6 ?F (37 ?C) (Oral)   Ht '5\' 5"'$  (1.651 m)   Wt 189 lb (85.7 kg)   SpO2 96%   BMI 31.45 kg/m?  ?Objective:  ? Physical Exam ?HENT:  ?   Right Ear: Tympanic membrane and ear canal normal.  ?   Left Ear: Tympanic membrane and ear canal normal.  ?   Nose: Nose normal.  ?Eyes:  ?   Conjunctiva/sclera: Conjunctivae normal.  ?   Pupils: Pupils are equal, round, and reactive to light.  ?Neck:  ?   Thyroid: No thyromegaly.  ?Cardiovascular:  ?   Rate and Rhythm: Normal rate and regular rhythm.  ?   Heart sounds: No murmur heard. ?Pulmonary:  ?   Effort: Pulmonary effort is normal.  ?   Breath sounds: Normal breath sounds. No rales.  ?Abdominal:  ?   General: Bowel sounds are normal.  ?   Palpations: Abdomen is soft.  ?   Tenderness: There is no abdominal tenderness.  ?Musculoskeletal:     ?   General: Normal range of motion.  ?   Cervical back: Neck supple.  ?Lymphadenopathy:  ?   Cervical: No cervical adenopathy.  ?Skin: ?   General: Skin is warm and dry.  ?   Findings: No rash.  ?Neurological:  ?   Mental Status: She is alert and oriented to person, place, and time.  ?   Cranial Nerves: No cranial nerve deficit.  ?   Deep Tendon Reflexes: Reflexes are normal and symmetric.  ?Psychiatric:     ?   Mood and Affect: Mood normal.  ? ? ? ? ? ?   ?Assessment & Plan:  ? ? ? ? ?This visit occurred during the SARS-CoV-2 public health emergency.  Safety protocols were in place, including  screening questions prior to the visit, additional usage of staff PPE, and extensive cleaning of exam room while observing appropriate contact time as indicated for disinfecting solutions.  ?

## 2021-10-23 NOTE — Assessment & Plan Note (Signed)
Controlled.   Continue omeprazole 20 mg daily. 

## 2021-10-23 NOTE — Assessment & Plan Note (Signed)
Due for second Shingrix vaccine, declines today but will schedule a nurse visit for the near future. ? ?Other vaccines up-to-date. ? ?Pap smear up-to-date. ?Mammogram up-to-date. ? ?Colonoscopy due this year, she is aware and has this scheduled. ? ?Discussed the importance of a healthy diet and regular exercise in order for weight loss, and to reduce the risk of further co-morbidity. ? ?Exam today stable ?Labs pending ?

## 2021-10-23 NOTE — Assessment & Plan Note (Signed)
Controlled. ? ?Continue lisinopril 30 mg daily. ?CMP pending. ?

## 2021-10-23 NOTE — Assessment & Plan Note (Signed)
Continue statin therapy. ? ?Continue blood pressure and diabetes control. ?

## 2021-10-23 NOTE — Telephone Encounter (Signed)
Patient was seen in office today.  

## 2021-10-23 NOTE — Patient Instructions (Signed)
Stop by the lab prior to leaving today. I will notify you of your results once received.   It was a pleasure to see you today!  Preventive Care 40-60 Years Old, Female Preventive care refers to lifestyle choices and visits with your health care provider that can promote health and wellness. Preventive care visits are also called wellness exams. What can I expect for my preventive care visit? Counseling Your health care provider may ask you questions about your: Medical history, including: Past medical problems. Family medical history. Pregnancy history. Current health, including: Menstrual cycle. Method of birth control. Emotional well-being. Home life and relationship well-being. Sexual activity and sexual health. Lifestyle, including: Alcohol, nicotine or tobacco, and drug use. Access to firearms. Diet, exercise, and sleep habits. Work and work environment. Sunscreen use. Safety issues such as seatbelt and bike helmet use. Physical exam Your health care provider will check your: Height and weight. These may be used to calculate your BMI (body mass index). BMI is a measurement that tells if you are at a healthy weight. Waist circumference. This measures the distance around your waistline. This measurement also tells if you are at a healthy weight and may help predict your risk of certain diseases, such as type 2 diabetes and high blood pressure. Heart rate and blood pressure. Body temperature. Skin for abnormal spots. What immunizations do I need?  Vaccines are usually given at various ages, according to a schedule. Your health care provider will recommend vaccines for you based on your age, medical history, and lifestyle or other factors, such as travel or where you work. What tests do I need? Screening Your health care provider may recommend screening tests for certain conditions. This may include: Lipid and cholesterol levels. Diabetes screening. This is done by checking  your blood sugar (glucose) after you have not eaten for a while (fasting). Pelvic exam and Pap test. Hepatitis B test. Hepatitis C test. HIV (human immunodeficiency virus) test. STI (sexually transmitted infection) testing, if you are at risk. Lung cancer screening. Colorectal cancer screening. Mammogram. Talk with your health care provider about when you should start having regular mammograms. This may depend on whether you have a family history of breast cancer. BRCA-related cancer screening. This may be done if you have a family history of breast, ovarian, tubal, or peritoneal cancers. Bone density scan. This is done to screen for osteoporosis. Talk with your health care provider about your test results, treatment options, and if necessary, the need for more tests. Follow these instructions at home: Eating and drinking  Eat a diet that includes fresh fruits and vegetables, whole grains, lean protein, and low-fat dairy products. Take vitamin and mineral supplements as recommended by your health care provider. Do not drink alcohol if: Your health care provider tells you not to drink. You are pregnant, may be pregnant, or are planning to become pregnant. If you drink alcohol: Limit how much you have to 0-1 drink a day. Know how much alcohol is in your drink. In the [place] one drink equals one 12 oz bottle of beer (355 mL), one 5 oz glass of wine (148 mL), or one 1 oz glass of hard liquor (44 mL). Lifestyle Brush your teeth every morning and night with fluoride toothpaste. Floss one time each day. Exercise for at least 30 minutes 5 or more days each week. Do not use any products that contain nicotine or tobacco. These products include cigarettes, chewing tobacco, and vaping devices, such as e-cigarettes. If you need   help quitting, ask your health care provider. Do not use drugs. If you are sexually active, practice safe sex. Use a condom or other form of protection to prevent STIs. If you  do not wish to become pregnant, use a form of birth control. If you plan to become pregnant, see your health care provider for a prepregnancy visit. Take aspirin only as told by your health care provider. Make sure that you understand how much to take and what form to take. Work with your health care provider to find out whether it is safe and beneficial for you to take aspirin daily. Find healthy ways to manage stress, such as: Meditation, yoga, or listening to music. Journaling. Talking to a trusted person. Spending time with friends and family. Minimize exposure to UV radiation to reduce your risk of skin cancer. Safety Always wear your seat belt while driving or riding in a vehicle. Do not drive: If you have been drinking alcohol. Do not ride with someone who has been drinking. When you are tired or distracted. While texting. If you have been using any mind-altering substances or drugs. Wear a helmet and other protective equipment during sports activities. If you have firearms in your house, make sure you follow all gun safety procedures. Seek help if you have been physically or sexually abused. What's next? Visit your health care provider once a year for an annual wellness visit. Ask your health care provider how often you should have your eyes and teeth checked. Stay up to date on all vaccines. This information is not intended to replace advice given to you by your health care provider. Make sure you discuss any questions you have with your health care provider. Document Revised: 12/24/2020 Document Reviewed: 12/24/2020 Elsevier Patient Education  2023 Elsevier Inc.  

## 2021-10-23 NOTE — Assessment & Plan Note (Signed)
Off meds for the last 2 years.  Mostly diet controlled. ? ?Repeat A1c pending today. ? ?Patient is interested in GLP-1 antagonist if warranted. ?

## 2021-10-26 DIAGNOSIS — K760 Fatty (change of) liver, not elsewhere classified: Secondary | ICD-10-CM

## 2021-10-27 ENCOUNTER — Other Ambulatory Visit: Payer: Self-pay | Admitting: Primary Care

## 2021-10-27 ENCOUNTER — Ambulatory Visit (INDEPENDENT_AMBULATORY_CARE_PROVIDER_SITE_OTHER): Payer: Self-pay | Admitting: Dermatology

## 2021-10-27 DIAGNOSIS — L988 Other specified disorders of the skin and subcutaneous tissue: Secondary | ICD-10-CM

## 2021-10-27 DIAGNOSIS — K219 Gastro-esophageal reflux disease without esophagitis: Secondary | ICD-10-CM

## 2021-10-27 NOTE — Progress Notes (Signed)
? ?  Follow-Up Visit ?  ?Subjective  ?Michelle Figueroa is a 61 y.o. female who presents for the following: Facial Elastosis (Patient is here today for Botox injections. She would like to inject the forehead only today. ). ? ?The following portions of the chart were reviewed this encounter and updated as appropriate:  ? Tobacco  Allergies  Meds  Problems  Med Hx  Surg Hx  Fam Hx   ?  ?Review of Systems:  No other skin or systemic complaints except as noted in HPI or Assessment and Plan. ? ?Objective  ?Well appearing patient in no apparent distress; mood and affect are within normal limits. ? ?A focused examination was performed including the face. Relevant physical exam findings are noted in the Assessment and Plan. ? ?Face ?Rhytides and volume loss.  ? ? ? ? ? ? ? ? ? ? ? ? ? ? ? ? ? ?Assessment & Plan  ?Elastosis of skin ?Face ? ?Botox injected today 10 units to the forehead as marked (increased from 8.5 units). ? ?Botox Injection - Face ?Location: See attached image ? ?Informed consent: Discussed risks (infection, pain, bleeding, bruising, swelling, allergic reaction, paralysis of nearby muscles, eyelid droop, double vision, neck weakness, difficulty breathing, headache, undesirable cosmetic result, and need for additional treatment) and benefits of the procedure, as well as the alternatives.  Informed consent was obtained. ? ?Preparation: The area was cleansed with alcohol. ? ?Procedure Details:  Botox was injected into the dermis with a 30-gauge needle. Pressure applied to any bleeding. Ice packs offered for swelling. ? ?Lot Number:  [id number] ?Expiration:  10/2023 ? ?Total Units Injected:  10 ? ?Plan: Patient was instructed to remain upright for 4 hours. Patient was instructed to avoid massaging the face and avoid vigorous exercise for the rest of the day. Tylenol may be used for headache.  Allow 2 weeks before returning to clinic for additional dosing as needed. Patient will call for any  problems. ? ? ? ?Return in about 4 months (around 02/26/2022) for Botox injections. ? ?I, Rudell Cobb, CMA, am acting as scribe for Sarina Ser, MD . ?Documentation: I have reviewed the above documentation for accuracy and completeness, and I agree with the above. ? ?Sarina Ser, MD ? ?

## 2021-10-27 NOTE — Patient Instructions (Signed)

## 2021-11-03 ENCOUNTER — Encounter: Payer: Self-pay | Admitting: Dermatology

## 2021-11-16 ENCOUNTER — Ambulatory Visit
Admission: RE | Admit: 2021-11-16 | Discharge: 2021-11-16 | Disposition: A | Discharge status: Home / Self Care | Payer: BC Managed Care – PPO | Source: Ambulatory Visit | Attending: Primary Care | Admitting: Primary Care

## 2021-11-16 DIAGNOSIS — K760 Fatty (change of) liver, not elsewhere classified: Secondary | ICD-10-CM | POA: Insufficient documentation

## 2021-11-16 DIAGNOSIS — R945 Abnormal results of liver function studies: Secondary | ICD-10-CM | POA: Diagnosis not present

## 2021-11-16 DIAGNOSIS — Z9049 Acquired absence of other specified parts of digestive tract: Secondary | ICD-10-CM | POA: Diagnosis not present

## 2021-11-24 DIAGNOSIS — K1321 Leukoplakia of oral mucosa, including tongue: Secondary | ICD-10-CM | POA: Diagnosis not present

## 2021-11-24 DIAGNOSIS — J019 Acute sinusitis, unspecified: Secondary | ICD-10-CM | POA: Diagnosis not present

## 2021-12-01 DIAGNOSIS — Z86010 Personal history of colonic polyps: Secondary | ICD-10-CM | POA: Insufficient documentation

## 2021-12-01 DIAGNOSIS — R7989 Other specified abnormal findings of blood chemistry: Secondary | ICD-10-CM | POA: Diagnosis not present

## 2021-12-01 DIAGNOSIS — K760 Fatty (change of) liver, not elsewhere classified: Secondary | ICD-10-CM | POA: Diagnosis not present

## 2021-12-01 DIAGNOSIS — Z800 Family history of malignant neoplasm of digestive organs: Secondary | ICD-10-CM | POA: Diagnosis not present

## 2022-01-19 ENCOUNTER — Encounter: Payer: Self-pay | Admitting: Primary Care

## 2022-01-19 ENCOUNTER — Telehealth: Payer: Self-pay

## 2022-01-19 ENCOUNTER — Ambulatory Visit: Payer: BC Managed Care – PPO | Admitting: Primary Care

## 2022-01-19 VITALS — BP 150/70 | HR 87 | Temp 96.7°F | Ht 65.0 in | Wt 188.4 lb

## 2022-01-19 DIAGNOSIS — E6609 Other obesity due to excess calories: Secondary | ICD-10-CM | POA: Diagnosis not present

## 2022-01-19 DIAGNOSIS — Z6831 Body mass index (BMI) 31.0-31.9, adult: Secondary | ICD-10-CM

## 2022-01-19 DIAGNOSIS — E1165 Type 2 diabetes mellitus with hyperglycemia: Secondary | ICD-10-CM

## 2022-01-19 LAB — POCT GLYCOSYLATED HEMOGLOBIN (HGB A1C): Hemoglobin A1C: 5.6 % (ref 4.0–5.6)

## 2022-01-19 MED ORDER — WEGOVY 0.25 MG/0.5ML ~~LOC~~ SOAJ: 0.2500 mg | SUBCUTANEOUS | 0 refills | Status: DC

## 2022-01-19 NOTE — Telephone Encounter (Signed)
Prior auth started for [facility] 0.'25MG'$ /0.5ML auto-injectors. Tonye Pearson (Key: [id number]) Rx #: [id number] Waiting for determination.

## 2022-01-19 NOTE — Telephone Encounter (Signed)
error 

## 2022-01-19 NOTE — Assessment & Plan Note (Addendum)
A1C of 5.6 today!  Strongly encouraged her to work on diet and to start exercising.  While I do agree to start GLP-1 inhibitor treatment for weight loss, she does not need it for diabetes treatment.   Rx for Wegovy 0.25 mg weekly sent to pharmacy. She will increase to 0.5 mg weekly thereafter.  We will closely monitor weight.  We discussed common side effects.

## 2022-01-19 NOTE — Progress Notes (Signed)
Subjective:    Patient ID: Michelle Figueroa, female    DOB: [birth year], 61 y.o.   MRN: [id number]  Diabetes Pertinent negatives for hypoglycemia include no dizziness. Pertinent negatives for diabetes include no chest pain.    Michelle Figueroa is a very pleasant 61 y.o. female with a history of type 2 diabetes, GERD, hyperlipidemia who presents today to discuss treatment options for diabetes and weight loss.  Currently managed on no treatment for diabetes. Last A1C of 5.8 in April 2023. Long history of obesity and trying to lose weight. Two years ago she lost 20 pounds through [facility], but as soon as she discontinued she re-gained her weight. She's tried calorie counting, weight watchers, Phentermine, Slim Fast with temporary improvement.   Diet currently consists of:  Breakfast: Skips mostly Lunch: Fast food or home made sandwich Dinner: Take food  Snacks: None Desserts: None Beverages: Little liquids throughout the day. Some water, occasional Ginger-ale  Exercise: Active with walking at work.    BP Readings from Last 3 Encounters:  01/19/22 (!) 150/70  10/23/21 120/78  08/25/21 118/82   Wt Readings from Last 3 Encounters:  01/19/22 188 lb 6 oz (85.4 kg)  10/23/21 189 lb (85.7 kg)  08/25/21 188 lb (85.3 kg)      Review of Systems  Respiratory:  Negative for shortness of breath.   Cardiovascular:  Negative for chest pain.  Gastrointestinal:  Negative for constipation.  Neurological:  Negative for dizziness.         Past Medical History:  Diagnosis Date   Diabetes mellitus without complication ([place])    followed by PCP   Diverticulosis    noted on colonoscopy   Essential hypertension    GERD (gastroesophageal reflux disease)    Hemorrhoids    Insomnia     Social History   Socioeconomic History   Marital status: Married    Spouse name: Not on file   Number of children: Not on file   Years of education: Not on file   Highest education level: Not on file   Occupational History   Not on file  Tobacco Use   Smoking status: Never   Smokeless tobacco: Never  Vaping Use   Vaping Use: Never used  Substance and Sexual Activity   Alcohol use: Yes    Alcohol/week: 3.0 standard drinks of alcohol    Types: 3 Standard drinks or equivalent per week   Drug use: No   Sexual activity: Yes    Birth control/protection: Post-menopausal  Other Topics Concern   Not on file  Social History Narrative   Married.   2 children.   Works in [employer] at [employer].    Enjoys traveling, spending time with family.    Social Determinants of Health   Financial Resource Strain: Not on file  Food Insecurity: Not on file  Transportation Needs: Not on file  Physical Activity: Not on file  Stress: Not on file  Social Connections: Not on file  Intimate Partner Violence: Not on file    Past Surgical History:  Procedure Laterality Date   APPENDECTOMY  2009   [place]   [facility] Suspension Arthroplasty Left 12/25/2020   left thumb with tight rope and tendon interposition   ENDOMETRIAL ABLATION     GYNECOLOGIC CRYOSURGERY     RETROPUBIC SLING  10/2009   Dr. Davis Gourd at [place]  11/19/2009   with cysto due to urinary retension    Family History  Problem Relation Age of Onset   Arthritis Mother    Diabetes Mother    Colon cancer Mother 33   Lung cancer Father    Hypertension Father    Heart attack Father    Heart attack Brother 6   Breast cancer Neg Hx     Allergies  Allergen Reactions   Erythromycin Ethylsuccinate Rash   Oxycodone-Acetaminophen Nausea Only    Current Outpatient Medications on File Prior to Visit  Medication Sig Dispense Refill   estradiol (ESTRACE VAGINAL) 0.1 MG/GM vaginal cream 1 gram pv twice weekly 42.5 g 3   lisinopril (ZESTRIL) 30 MG tablet Take 1 tablet (30 mg total) by mouth daily. for blood pressure. 90 tablet 3    Multiple Vitamin (MULTIVITAMIN) tablet Take 1 tablet by mouth daily.     omeprazole (PRILOSEC) 20 MG capsule Take 1 capsule (20 mg total) by mouth daily. For heartburn 90 capsule 3   pravastatin (PRAVACHOL) 40 MG tablet Take 1 tablet (40 mg total) by mouth daily. for cholesterol. 90 tablet 3   sertraline (ZOLOFT) 100 MG tablet Take 1 tablet (100 mg total) by mouth daily. for anxiety. 90 tablet 3   VITAMIN D PO Take by mouth.     No current facility-administered medications on file prior to visit.    BP (!) 150/70   Pulse 87   Temp (!) 96.7 F (35.9 C) (Temporal)   Ht '5\' 5"'$  (1.651 m)   Wt 188 lb 6 oz (85.4 kg)   SpO2 97%   BMI 31.35 kg/m  Objective:   Physical Exam Cardiovascular:     Rate and Rhythm: Normal rate and regular rhythm.  Pulmonary:     Effort: Pulmonary effort is normal.     Breath sounds: Normal breath sounds.  Musculoskeletal:     Cervical back: Neck supple.  Skin:    General: Skin is warm and dry.           Assessment & Plan:   Problem List Items Addressed This Visit       Endocrine   Controlled type 2 diabetes mellitus ([place])    A1C of 5.6 today!  Strongly encouraged her to work on diet and to start exercising.  While I do agree to start GLP-1 inhibitor treatment for weight loss, she does not need it for diabetes treatment.   Rx for Wegovy 0.25 mg weekly sent to pharmacy. She will increase to 0.5 mg weekly thereafter.  We will closely monitor weight.  We discussed common side effects.      Relevant Medications   Semaglutide-Weight Management (WEGOVY) 0.25 MG/0.5ML SOAJ   Other Relevant Orders   POCT glycosylated hemoglobin (Hb A1C) (Completed)     Other   Class 1 obesity due to excess calories with body mass index (BMI) of 31.0 to 31.9 in adult - Primary    Poor diet, no regular exercise. Discussed to work on improving both.  Agree to start GLP-I inhibitor for weight loss.  Rx for Wegovy 0.25 mg weekly x 4 weeks sent to pharmacy.  Increase to 0.5 mg weekly thereafter. Follow up in 2-3 months.       Relevant Medications   Semaglutide-Weight Management (WEGOVY) 0.25 MG/0.5ML SOAJ       Pleas Koch, NP

## 2022-01-19 NOTE — Assessment & Plan Note (Signed)
Poor diet, no regular exercise. Discussed to work on improving both.  Agree to start GLP-I inhibitor for weight loss.  Rx for Wegovy 0.25 mg weekly x 4 weeks sent to pharmacy. Increase to 0.5 mg weekly thereafter. Follow up in 2-3 months.

## 2022-01-19 NOTE — Patient Instructions (Addendum)
Start semaglutide [facility]) 0.25 mg for weight loss. Inject 0.25 mg into the skin once weekly x 4 weeks, then increase to 0.5 mg weekly thereafter.  Schedule a follow up visit for 3 months.  It was a pleasure to see you today!

## 2022-01-21 ENCOUNTER — Ambulatory Visit (INDEPENDENT_AMBULATORY_CARE_PROVIDER_SITE_OTHER): Payer: BC Managed Care – PPO | Admitting: Obstetrics & Gynecology

## 2022-01-21 ENCOUNTER — Encounter (HOSPITAL_BASED_OUTPATIENT_CLINIC_OR_DEPARTMENT_OTHER): Payer: Self-pay | Admitting: Obstetrics & Gynecology

## 2022-01-21 ENCOUNTER — Other Ambulatory Visit (HOSPITAL_COMMUNITY)
Admission: RE | Admit: 2022-01-21 | Discharge: 2022-01-21 | Disposition: A | Discharge status: Home / Self Care | Payer: BC Managed Care – PPO | Source: Ambulatory Visit | Attending: Obstetrics & Gynecology | Admitting: Obstetrics & Gynecology

## 2022-01-21 VITALS — BP 141/72 | HR 97 | Ht 66.0 in | Wt 189.4 lb

## 2022-01-21 DIAGNOSIS — Z124 Encounter for screening for malignant neoplasm of cervix: Secondary | ICD-10-CM | POA: Diagnosis not present

## 2022-01-21 DIAGNOSIS — Z8249 Family history of ischemic heart disease and other diseases of the circulatory system: Secondary | ICD-10-CM

## 2022-01-21 DIAGNOSIS — R748 Abnormal levels of other serum enzymes: Secondary | ICD-10-CM

## 2022-01-21 DIAGNOSIS — Z86010 Personal history of colonic polyps: Secondary | ICD-10-CM

## 2022-01-21 DIAGNOSIS — Z01419 Encounter for gynecological examination (general) (routine) without abnormal findings: Secondary | ICD-10-CM | POA: Diagnosis not present

## 2022-01-21 DIAGNOSIS — N952 Postmenopausal atrophic vaginitis: Secondary | ICD-10-CM

## 2022-01-21 MED ORDER — ESTRADIOL 0.1 MG/GM VA CREA: TOPICAL_CREAM | VAGINAL | 3 refills | Status: DC

## 2022-01-21 NOTE — Progress Notes (Signed)
61 y.o. G3P2 Married White or Caucasian female here for annual exam.  Having a lot of stressors with her mother.  She is in skilled nursing due to dementia.  H/o elevated liver enzymes and had RUQ ultrasound recently.  Had fatty liver.   Denies vaginal bleeding.  Has significant dryness with intercourse.  Has vaginal dryness.  Has used vaginal estrogen but not using regularly.  Options discussed.  Desires to continue estrogen cream but needs RF>  No LMP recorded. Patient is postmenopausal.          Sexually active: Yes.    The current method of family planning is post menopausal status.    Smoker:  no  Health Maintenance: Pap:  obtained today History of abnormal Pap:  remote hx MMG:  03/2021 Colonoscopy:  2020 scheduled September 1st BMD:   not indicated Screening Labs: 10/2021   reports that she has never smoked. She has never used smokeless tobacco. She reports current alcohol use of about 3.0 standard drinks of alcohol per week. She reports that she does not use drugs.  Past Medical History:  Diagnosis Date   Diabetes mellitus without complication ([place])    followed by PCP   Diverticulosis    noted on colonoscopy   Essential hypertension    GERD (gastroesophageal reflux disease)    Hemorrhoids    Insomnia     Past Surgical History:  Procedure Laterality Date   APPENDECTOMY  2009   CERVICAL FUSION  2010   CESAREAN SECTION     CHOLECYSTECTOMY  2000   [facility] Suspension Arthroplasty Left 12/25/2020   left thumb with tight rope and tendon interposition   ENDOMETRIAL ABLATION     GYNECOLOGIC CRYOSURGERY     RETROPUBIC SLING  10/2009   Dr. Davis Gourd at [place]  11/19/2009   with cysto due to urinary retension    Current Outpatient Medications  Medication Sig Dispense Refill   lisinopril (ZESTRIL) 30 MG tablet Take 1 tablet (30 mg total) by mouth daily. for blood pressure. 90 tablet 3   Multiple Vitamin (MULTIVITAMIN) tablet Take 1 tablet by  mouth daily.     omeprazole (PRILOSEC) 20 MG capsule Take 1 capsule (20 mg total) by mouth daily. For heartburn 90 capsule 3   pravastatin (PRAVACHOL) 40 MG tablet Take 1 tablet (40 mg total) by mouth daily. for cholesterol. 90 tablet 3   Semaglutide-Weight Management (WEGOVY) 0.25 MG/0.5ML SOAJ Inject 0.25 mg into the skin once a week. 2 mL 0   sertraline (ZOLOFT) 100 MG tablet Take 1 tablet (100 mg total) by mouth daily. for anxiety. 90 tablet 3   VITAMIN D PO Take by mouth.     estradiol (ESTRACE VAGINAL) 0.1 MG/GM vaginal cream 1 gram pv twice weekly 42.5 g 3   No current facility-administered medications for this visit.    Family History  Problem Relation Age of Onset   Arthritis Mother    Diabetes Mother    Colon cancer Mother 67   Lung cancer Father    Hypertension Father    Heart attack Father    Heart attack Brother 15   Breast cancer Neg Hx    ROS: Genitourinary:negative  Exam:   BP (!) 141/72 (BP Location: Left Arm, Patient Position: Sitting, Cuff Size: Large)   Pulse 97   Ht '5\' 6"'$  (1.676 m) Comment: Reported  Wt 189 lb 6.4 oz (85.9 kg)   BMI 30.57 kg/m   Height: '5\' 6"'$  (167.6  cm) (Reported)  General appearance: alert, cooperative and appears stated age Head: Normocephalic, without obvious abnormality, atraumatic Neck: no adenopathy, supple, symmetrical, trachea midline and thyroid normal to inspection and palpation Lungs: clear to auscultation bilaterally Breasts: normal appearance, no masses or tenderness Heart: regular rate and rhythm Abdomen: soft, non-tender; bowel sounds normal; no masses,  no organomegaly Extremities: extremities normal, atraumatic, no cyanosis or edema Skin: Skin color, texture, turgor normal. No rashes or lesions Lymph nodes: Cervical, supraclavicular, and axillary nodes normal. No abnormal inguinal nodes palpated Neurologic: Grossly normal   Pelvic: External genitalia:  no lesions              Urethra:  normal appearing urethra with  no masses, tenderness or lesions              Bartholins and Skenes: normal                 Vagina: normal appearing vagina with normal color and no discharge, no lesions              Cervix: no lesions              Pap taken: Yes.   Bimanual Exam:  Uterus:  normal size, contour, position, consistency, mobility, non-tender              Adnexa: normal adnexa and no mass, fullness, tenderness               Rectovaginal: Confirms               Anus:  normal sphincter tone, no lesions  Chaperone, Octaviano Batty, CMA, was present for exam.  Assessment/Plan: 1. Well woman exam with routine gynecological exam - pap and HR HPV obtained today - MMG 03/2021 - colonoscopy 2020.  This is scheduled for 03/12/2022. - BMD not indicated at this time. - lab work done 10/2021 - vaccines reviewed/updated  2. Cervical cancer screening - Cytology - PAP( [place])  3. Vaginal atrophy - OTC and prescription options discussed - estradiol (ESTRACE VAGINAL) 0.1 MG/GM vaginal cream; 1 gram pv twice weekly  Dispense: 42.5 g; Refill: 3  4. Family history of early CAD - followed by cardiology  5. Hx of adenomatous colonic polyps  6. Elevated liver enzymes - most recent with mild elevated (done 10/2021)

## 2022-01-22 NOTE — Telephone Encounter (Signed)
Prior auth for [employer] 0.'25MG'$ /0.5ML auto-injectors has been approved. BRANNON DECAIRE (Key: [id number]) Rx #: [id number]  Message from plan: Effective from 01/19/2022 through 05/24/2022.

## 2022-01-25 LAB — CYTOLOGY - PAP
Comment: NEGATIVE
Diagnosis: NEGATIVE
High risk HPV: NEGATIVE

## 2022-01-26 ENCOUNTER — Ambulatory Visit (INDEPENDENT_AMBULATORY_CARE_PROVIDER_SITE_OTHER): Payer: Self-pay | Admitting: Dermatology

## 2022-01-26 DIAGNOSIS — L578 Other skin changes due to chronic exposure to nonionizing radiation: Secondary | ICD-10-CM

## 2022-01-26 DIAGNOSIS — L988 Other specified disorders of the skin and subcutaneous tissue: Secondary | ICD-10-CM

## 2022-01-26 DIAGNOSIS — L821 Other seborrheic keratosis: Secondary | ICD-10-CM

## 2022-01-26 NOTE — Progress Notes (Signed)
   Follow-Up Visit   Subjective  Michelle Figueroa is a 61 y.o. female who presents for the following: Facial Elastosis (3 month botox touchup) and Other (Patient has a spot at right upper arm she would like checked. ). The patient has spots, moles and lesions to be evaluated, some may be new or changing and the patient has concerns that these could be cancer.  The following portions of the chart were reviewed this encounter and updated as appropriate:  Tobacco  Allergies  Meds  Problems  Med Hx  Surg Hx  Fam Hx     Review of Systems: No other skin or systemic complaints except as noted in HPI or Assessment and Plan.  Objective  Well appearing patient in no apparent distress; mood and affect are within normal limits.  A focused examination was performed including face, right arm, left thigh. Relevant physical exam findings are noted in the Assessment and Plan.  face Rhytides and volume loss.       Assessment & Plan   Elastosis of skin Pt declines full recommended Botox treatment today. She just wants to do a minimal amount. face Botox Injection - face Location: See attached image  Informed consent: Discussed risks (infection, pain, bleeding, bruising, swelling, allergic reaction, paralysis of nearby muscles, eyelid droop, double vision, neck weakness, difficulty breathing, headache, undesirable cosmetic result, and need for additional treatment) and benefits of the procedure, as well as the alternatives.  Informed consent was obtained.  Preparation: The area was cleansed with alcohol.  Procedure Details:  Botox was injected into the dermis with a 30-gauge needle. Pressure applied to any bleeding. Ice packs offered for swelling.  Lot Number:  [id number] Expiration:  11/2023  Total Units Injected:  17.5 units   Plan: Tylenol may be used for headache.  Allow 2 weeks before returning to clinic for additional dosing as needed. Patient will call for any  problems.  Seborrheic Keratoses - Stuck-on, waxy, tan-brown papules and/or plaques at right upper arm and left inner thigh - Benign-appearing - Discussed benign etiology and prognosis. - Observe - Call for any changes  Actinic Damage - chronic, secondary to cumulative UV radiation exposure/sun exposure over time - diffuse scaly erythematous macules with underlying dyspigmentation - Recommend daily broad spectrum sunscreen SPF 30+ to sun-exposed areas, reapply every 2 hours as needed.  - Recommend staying in the shade or wearing long sleeves, sun glasses (UVA+UVB protection) and wide brim hats (4-inch brim around the entire circumference of the hat). - Call for new or changing lesions.  Return for 3 - 4 month botox . IRuthell Rummage, CMA, am acting as scribe for Sarina Ser, MD. Documentation: I have reviewed the above documentation for accuracy and completeness, and I agree with the above.  Sarina Ser, MD

## 2022-01-26 NOTE — Patient Instructions (Addendum)
Seborrheic Keratosis  What causes seborrheic keratoses? Seborrheic keratoses are harmless, common skin growths that first appear during adult life.  As time goes by, more growths appear.  Some people may develop a large number of them.  Seborrheic keratoses appear on both covered and uncovered body parts.  They are not caused by sunlight.  The tendency to develop seborrheic keratoses can be inherited.  They vary in color from skin-colored to gray, brown, or even black.  They can be either smooth or have a rough, warty surface.   Seborrheic keratoses are superficial and look as if they were stuck on the skin.  Under the microscope this type of keratosis looks like layers upon layers of skin.  That is why at times the top layer may seem to fall off, but the rest of the growth remains and re-grows.    Treatment Seborrheic keratoses do not need to be treated, but can easily be removed in the office.  Seborrheic keratoses often cause symptoms when they rub on clothing or jewelry.  Lesions can be in the way of shaving.  If they become inflamed, they can cause itching, soreness, or burning.  Removal of a seborrheic keratosis can be accomplished by freezing, burning, or surgery. If any spot bleeds, scabs, or grows rapidly, please return to have it checked, as these can be an indication of a skin cancer.   Due to recent changes in healthcare laws, you may see results of your pathology and/or laboratory studies on MyChart before the doctors have had a chance to review them. We understand that in some cases there may be results that are confusing or concerning to you. Please understand that not all results are received at the same time and often the doctors may need to interpret multiple results in order to provide you with the best plan of care or course of treatment. Therefore, we ask that you please give us 2 business days to thoroughly review all your results before contacting the office for clarification.  Should we see a critical lab result, you will be contacted sooner.   If You Need Anything After Your Visit  If you have any questions or concerns for your doctor, please call our main line at [phone] and press option 4 to reach your doctor's medical assistant. If no one answers, please leave a voicemail as directed and we will return your call as soon as possible. Messages left after 4 pm will be answered the following business day.   You may also send us a message via MyChart. We typically respond to MyChart messages within 1-2 business days.  For prescription refills, please ask your pharmacy to contact our office. Our fax number is [phone].  If you have an urgent issue when the clinic is closed that cannot wait until the next business day, you can page your doctor at the number below.    Please note that while we do our best to be available for urgent issues outside of office hours, we are not available 24/7.   If you have an urgent issue and are unable to reach us, you may choose to seek medical care at your doctor's office, retail clinic, urgent care center, or emergency room.  If you have a medical emergency, please immediately call 911 or go to the emergency department.  Pager Numbers  - Dr. Kowalski: [phone]  - Dr. Moye: [phone]  - Dr. Stewart: [phone]  In the event of inclement weather, please call our main line   at [phone] for an update on the status of any delays or closures.  Dermatology Medication Tips: Please keep the boxes that topical medications come in in order to help keep track of the instructions about where and how to use these. Pharmacies typically print the medication instructions only on the boxes and not directly on the medication tubes.   If your medication is too expensive, please contact our office at [phone] option 4 or send us a message through MyChart.   We are unable to tell what your co-pay for medications will be  in advance as this is different depending on your insurance coverage. However, we may be able to find a substitute medication at lower cost or fill out paperwork to get insurance to cover a needed medication.   If a prior authorization is required to get your medication covered by your insurance company, please allow us 1-2 business days to complete this process.  Drug prices often vary depending on where the prescription is filled and some pharmacies may offer cheaper prices.  The website www.goodrx.com contains coupons for medications through different pharmacies. The prices here do not account for what the cost may be with help from insurance (it may be cheaper with your insurance), but the website can give you the price if you did not use any insurance.  - You can print the associated coupon and take it with your prescription to the pharmacy.  - You may also stop by our office during regular business hours and pick up a GoodRx coupon card.  - If you need your prescription sent electronically to a different pharmacy, notify our office through [facility] MyChart or by phone at [phone] option 4.     Si Usted Necesita Algo Despus de Su Visita  Tambin puede enviarnos un mensaje a travs de MyChart. Por lo general respondemos a los mensajes de MyChart en el transcurso de 1 a 2 das hbiles.  Para renovar recetas, por favor pida a su farmacia que se ponga en contacto con nuestra oficina. Nuestro nmero de fax es el [phone].  Si tiene un asunto urgente cuando la clnica est cerrada y que no puede esperar hasta el siguiente da hbil, puede llamar/localizar a su doctor(a) al nmero que aparece a continuacin.   Por favor, tenga en cuenta que aunque hacemos todo lo posible para estar disponibles para asuntos urgentes fuera del horario de oficina, no estamos disponibles las 24 horas del da, los 7 das de la semana.   Si tiene un problema urgente y no puede comunicarse con nosotros,  puede optar por buscar atencin mdica  en el consultorio de su doctor(a), en una clnica privada, en un centro de atencin urgente o en una sala de emergencias.  Si tiene una emergencia mdica, por favor llame inmediatamente al 911 o vaya a la sala de emergencias.  Nmeros de bper  - Dr. Kowalski: [phone]  - Dra. Moye: [phone]  - Dra. Stewart: [phone]  En caso de inclemencias del tiempo, por favor llame a nuestra lnea principal al [phone] para una actualizacin sobre el estado de cualquier retraso o cierre.  Consejos para la medicacin en dermatologa: Por favor, guarde las cajas en las que vienen los medicamentos de uso tpico para ayudarle a seguir las instrucciones sobre dnde y cmo usarlos. Las farmacias generalmente imprimen las instrucciones del medicamento slo en las cajas y no directamente en los tubos del medicamento.   Si su medicamento es muy caro, por favor, pngase en contacto   con nuestra oficina llamando al [phone] y presione la opcin 4 o envenos un mensaje a travs de MyChart.   No podemos decirle cul ser su copago por los medicamentos por adelantado ya que esto es diferente dependiendo de la cobertura de su seguro. Sin embargo, es posible que podamos encontrar un medicamento sustituto a menor costo o llenar un formulario para que el seguro cubra el medicamento que se considera necesario.   Si se requiere una autorizacin previa para que su compaa de seguros cubra su medicamento, por favor permtanos de 1 a 2 das hbiles para completar este proceso.  Los precios de los medicamentos varan con frecuencia dependiendo del lugar de dnde se surte la receta y alguna farmacias pueden ofrecer precios ms baratos.  El sitio web www.goodrx.com tiene cupones para medicamentos de diferentes farmacias. Los precios aqu no tienen en cuenta lo que podra costar con la ayuda del seguro (puede ser ms barato con su seguro), pero el sitio web puede darle el  precio si no utiliz ningn seguro.  - Puede imprimir el cupn correspondiente y llevarlo con su receta a la farmacia.  - Tambin puede pasar por nuestra oficina durante el horario de atencin regular y recoger una tarjeta de cupones de GoodRx.  - Si necesita que su receta se enve electrnicamente a una farmacia diferente, informe a nuestra oficina a travs de MyChart de [place] o por telfono llamando al [phone] y presione la opcin 4.  

## 2022-02-05 ENCOUNTER — Encounter: Payer: Self-pay | Admitting: Dermatology

## 2022-02-15 ENCOUNTER — Other Ambulatory Visit: Payer: Self-pay | Admitting: Primary Care

## 2022-02-15 DIAGNOSIS — Z1231 Encounter for screening mammogram for malignant neoplasm of breast: Secondary | ICD-10-CM

## 2022-02-17 ENCOUNTER — Encounter (INDEPENDENT_AMBULATORY_CARE_PROVIDER_SITE_OTHER): Payer: Self-pay

## 2022-03-12 DIAGNOSIS — Z86010 Personal history of colonic polyps: Secondary | ICD-10-CM | POA: Diagnosis not present

## 2022-03-12 DIAGNOSIS — K5730 Diverticulosis of large intestine without perforation or abscess without bleeding: Secondary | ICD-10-CM | POA: Diagnosis not present

## 2022-03-12 DIAGNOSIS — D125 Benign neoplasm of sigmoid colon: Secondary | ICD-10-CM | POA: Diagnosis not present

## 2022-03-12 DIAGNOSIS — K635 Polyp of colon: Secondary | ICD-10-CM | POA: Diagnosis not present

## 2022-03-12 DIAGNOSIS — Z1211 Encounter for screening for malignant neoplasm of colon: Secondary | ICD-10-CM | POA: Diagnosis not present

## 2022-03-12 DIAGNOSIS — K640 First degree hemorrhoids: Secondary | ICD-10-CM | POA: Diagnosis not present

## 2022-03-12 LAB — HM COLONOSCOPY

## 2022-03-14 DIAGNOSIS — K5900 Constipation, unspecified: Secondary | ICD-10-CM | POA: Diagnosis not present

## 2022-03-14 DIAGNOSIS — R110 Nausea: Secondary | ICD-10-CM | POA: Diagnosis not present

## 2022-03-14 DIAGNOSIS — R1030 Lower abdominal pain, unspecified: Secondary | ICD-10-CM | POA: Diagnosis not present

## 2022-03-14 DIAGNOSIS — K5732 Diverticulitis of large intestine without perforation or abscess without bleeding: Secondary | ICD-10-CM | POA: Diagnosis not present

## 2022-03-14 DIAGNOSIS — R10814 Left lower quadrant abdominal tenderness: Secondary | ICD-10-CM | POA: Diagnosis not present

## 2022-03-14 DIAGNOSIS — R630 Anorexia: Secondary | ICD-10-CM | POA: Diagnosis not present

## 2022-03-14 DIAGNOSIS — R509 Fever, unspecified: Secondary | ICD-10-CM | POA: Diagnosis not present

## 2022-03-14 DIAGNOSIS — R140 Abdominal distension (gaseous): Secondary | ICD-10-CM | POA: Diagnosis not present

## 2022-03-14 DIAGNOSIS — I10 Essential (primary) hypertension: Secondary | ICD-10-CM | POA: Diagnosis not present

## 2022-03-14 DIAGNOSIS — R1032 Left lower quadrant pain: Secondary | ICD-10-CM | POA: Diagnosis not present

## 2022-03-14 DIAGNOSIS — R739 Hyperglycemia, unspecified: Secondary | ICD-10-CM | POA: Diagnosis not present

## 2022-03-22 ENCOUNTER — Ambulatory Visit
Admission: RE | Admit: 2022-03-22 | Discharge: 2022-03-22 | Disposition: A | Discharge status: Home / Self Care | Payer: BC Managed Care – PPO | Source: Ambulatory Visit | Attending: Primary Care | Admitting: Primary Care

## 2022-03-22 DIAGNOSIS — Z1231 Encounter for screening mammogram for malignant neoplasm of breast: Secondary | ICD-10-CM | POA: Diagnosis not present

## 2022-03-23 DIAGNOSIS — K5792 Diverticulitis of intestine, part unspecified, without perforation or abscess without bleeding: Secondary | ICD-10-CM | POA: Diagnosis not present

## 2022-03-30 LAB — HM DIABETES EYE EXAM

## 2022-05-11 DIAGNOSIS — Z23 Encounter for immunization: Secondary | ICD-10-CM | POA: Diagnosis not present

## 2022-05-25 ENCOUNTER — Ambulatory Visit: Payer: BC Managed Care – PPO | Admitting: Dermatology

## 2022-06-08 ENCOUNTER — Ambulatory Visit: Payer: BC Managed Care – PPO | Admitting: Dermatology

## 2022-06-30 ENCOUNTER — Ambulatory Visit (INDEPENDENT_AMBULATORY_CARE_PROVIDER_SITE_OTHER): Payer: BC Managed Care – PPO | Admitting: Dermatology

## 2022-06-30 DIAGNOSIS — L814 Other melanin hyperpigmentation: Secondary | ICD-10-CM

## 2022-06-30 DIAGNOSIS — D229 Melanocytic nevi, unspecified: Secondary | ICD-10-CM

## 2022-06-30 DIAGNOSIS — L821 Other seborrheic keratosis: Secondary | ICD-10-CM

## 2022-06-30 DIAGNOSIS — R202 Paresthesia of skin: Secondary | ICD-10-CM

## 2022-06-30 DIAGNOSIS — D492 Neoplasm of unspecified behavior of bone, soft tissue, and skin: Secondary | ICD-10-CM

## 2022-06-30 DIAGNOSIS — Z1283 Encounter for screening for malignant neoplasm of skin: Secondary | ICD-10-CM | POA: Diagnosis not present

## 2022-06-30 DIAGNOSIS — L578 Other skin changes due to chronic exposure to nonionizing radiation: Secondary | ICD-10-CM

## 2022-06-30 NOTE — Patient Instructions (Addendum)
Recommend taking Heliocare sun protection supplement daily in sunny weather for additional sun protection. For maximum protection on the sunniest days, you can take up to 2 capsules of regular Heliocare OR take 1 capsule of Heliocare Ultra. For prolonged exposure (such as a full day in the sun), you can repeat your dose of the supplement 4 hours after your first dose. Heliocare can be purchased at [employer], at some [facility] or at [email or web address].    Recommend OTC Gold Bond Rapid Relief Anti-Itch cream (pramoxine + menthol), CeraVe Anti-itch cream or lotion (pramoxine), Sarna lotion (Original- menthol + camphor or Sensitive- pramoxine) or Eucerin 12 hour Itch Relief lotion (menthol) up to 3 times per day to areas on body that are itchy.   Melanoma ABCDEs  Melanoma is the most dangerous type of skin cancer, and is the leading cause of death from skin disease.  You are more likely to develop melanoma if you: Have light-colored skin, light-colored eyes, or red or blond hair Spend a lot of time in the sun Tan regularly, either outdoors or in a tanning bed Have had blistering sunburns, especially during childhood Have a close family member who has had a melanoma Have atypical moles or large birthmarks  Early detection of melanoma is key since treatment is typically straightforward and cure rates are extremely high if we catch it early.   The first sign of melanoma is often a change in a mole or a new dark spot.  The ABCDE system is a way of remembering the signs of melanoma.  A for asymmetry:  The two halves do not match. B for border:  The edges of the growth are irregular. C for color:  A mixture of colors are present instead of an even brown color. D for diameter:  Melanomas are usually (but not always) greater than 10m - the size of a pencil eraser. E for evolution:  The spot keeps changing in size, shape, and color.  Please check your skin once per month between visits. You can  use a small mirror in front and a large mirror behind you to keep an eye on the back side or your body.   If you see any new or changing lesions before your next follow-up, please call to schedule a visit.  Please continue daily skin protection including broad spectrum sunscreen SPF 30+ to sun-exposed areas, reapplying every 2 hours as needed when you're outdoors.    Due to recent changes in healthcare laws, you may see results of your pathology and/or laboratory studies on MyChart before the doctors have had a chance to review them. We understand that in some cases there may be results that are confusing or concerning to you. Please understand that not all results are received at the same time and often the doctors may need to interpret multiple results in order to provide you with the best plan of care or course of treatment. Therefore, we ask that you please give u[place]2 business days to thoroughly review all your results before contacting the office for clarification. Should we see a critical lab result, you will be contacted sooner.   If You Need Anything After Your Visit  If you have any questions or concerns for your doctor, please call our main line at [phone] press option 4 to reach your doctor's medical assistant. If no one answers, please leave a voicemail as directed and we will return your call as soon as possible. Messages left after 4 pm  will be answered the following business day.   You may also send [place] a message via [place]. We typically respond to MyChart messages within 1-2 business days.  For prescription refills, please ask your pharmacy to contact our office. Our fax number is [phone].  If you have an urgent issue when the clinic is closed that cannot wait until the next business day, you can page your doctor at the number below.    Please note that while we do our best to be available for urgent issues outside of office hours, we are not available 24/7.   If you  have an urgent issue and are unable to reach [place], you may choose to seek medical care at your doctor's office, retail clinic, urgent care center, or emergency room.  If you have a medical emergency, please immediately call 911 or go to the emergency department.  Pager Numbers  - Dr. Nehemiah Massed: [phone]  - Dr. Laurence Ferrari: [phone]  - Dr. Nicole Kindred: [phone]  In the event of inclement weather, please call our main line at [phone] for an update on the status of any delays or closures.  Dermatology Medication Tips: Please keep the boxes that topical medications come in in order to help keep track of the instructions about where and how to use these. Pharmacies typically print the medication instructions only on the boxes and not directly on the medication tubes.   If your medication is too expensive, please contact our office at [phone] option 4 or send [place] a message through [place].   We are unable to tell what your co-pay for medications will be in advance as this is different depending on your insurance coverage. However, we may be able to find a substitute medication at lower cost or fill out paperwork to get insurance to cover a needed medication.   If a prior authorization is required to get your medication covered by your insurance company, please allow [place] 1-2 business days to complete this process.  Drug prices often vary depending on where the prescription is filled and some pharmacies may offer cheaper prices.  The website www.goodrx.com contains coupons for medications through different pharmacies. The prices here do not account for what the cost may be with help from insurance (it may be cheaper with your insurance), but the website can give you the price if you did not use any insurance.  - You can print the associated coupon and take it with your prescription to the pharmacy.  - You may also stop by our office during regular business hours and pick up a GoodRx coupon  card.  - If you need your prescription sent electronically to a different pharmacy, notify our office through [facility] or by phone at [phone] option 4.     Si Usted Necesita Algo Despus de Su Visita  Tambin puede enviarnos un mensaje a travs de Pharmacist, community. Por lo general respondemos a los mensajes de MyChart en el transcurso de 1 a 2 das hbiles.  Para renovar recetas, por favor pida a su farmacia que se ponga en contacto con nuestra oficina. Harland Dingwall de fax es [place] [phone].  Si tiene un asunto urgente cuando la clnica est cerrada y que no puede esperar hasta el siguiente da hbil, puede llamar/localizar a su doctor(a) al nmero que aparece a continuacin.   Por favor, tenga en cuenta que aunque hacemos todo lo posible para estar disponibles para asuntos urgentes fuera del horario de oficina, no estamos disponibles las 24 horas  del da, los 7 das de la [facility].   Si tiene un problema urgente y no puede comunicarse con nosotros, puede optar por buscar atencin mdica  en el consultorio de su doctor(a), en una clnica privada, en un centro de atencin urgente o en una sala de emergencias.  Si tiene Engineering geologist, por favor llame inmediatamente al 911 o vaya a la sala de emergencias.  Nmeros de bper  - Dr. Nehemiah Massed: [phone]  - Dra. Moye: [phone]  - Dra. Nicole Kindred: [phone]  En caso de inclemencias del [place], por favor llame a Johnsie Kindred principal al [phone] para una actualizacin sobre el [place] de cualquier retraso o cierre.  Consejos para la medicacin en dermatologa: Por favor, guarde las cajas en las que vienen los medicamentos de uso tpico para ayudarle a seguir las instrucciones sobre dnde y cmo usarlos. Las farmacias generalmente imprimen las instrucciones del medicamento slo en las cajas y no directamente en los tubos del [place].   Si su medicamento es muy caro, por favor, pngase en contacto con Zigmund Daniel llamando al [phone] y presione la opcin 4 o envenos un mensaje a travs de Pharmacist, community.   No podemos decirle cul ser su copago por los medicamentos por adelantado ya que esto es diferente dependiendo de la cobertura de su seguro. Sin embargo, es posible que podamos encontrar un medicamento sustituto a Electrical engineer un formulario para que el seguro cubra el medicamento que se considera necesario.   Si se requiere una autorizacin previa para que su compaa de seguros [place] su medicamento, por favor permtanos de 1 a 2 das hbiles para completar este proceso.  Los precios de los medicamentos varan con frecuencia dependiendo del Environmental consultant de dnde se surte la receta y alguna farmacias pueden ofrecer precios ms baratos.  El sitio web www.goodrx.com tiene cupones para medicamentos de Airline pilot. Los precios aqu no tienen en cuenta lo que podra costar con la ayuda del seguro (puede ser ms barato con su seguro), pero el sitio web puede darle el precio si no utiliz Research scientist (physical sciences).  - Puede imprimir el cupn correspondiente y llevarlo con su receta a la farmacia.  - Tambin puede pasar por nuestra oficina durante el horario de atencin regular y Charity fundraiser una tarjeta de cupones de GoodRx.  - Si necesita que su receta se enve electrnicamente a una farmacia diferente, informe a nuestra oficina a travs de MyChart de [place] o por telfono llamando al [phone] y presione la opcin 4.

## 2022-06-30 NOTE — Progress Notes (Signed)
Follow-Up Visit   Subjective  Michelle Figueroa is a 61 y.o. female who presents for the following: FBSE (No hx skin cancer. Patient does have a spot at left posterior thigh she is concerned about, no bleeding or itching. She does have a spot that itches and gets irritated at back.).  The patient presents for Total-Body Skin Exam (TBSE) for skin cancer screening and mole check.  The patient has spots, moles and lesions to be evaluated, some may be new or changing and the patient has concerns that these could be cancer.   The following portions of the chart were reviewed this encounter and updated as appropriate:   Tobacco  Allergies  Meds  Problems  Med Hx  Surg Hx  Fam Hx      Review of Systems:  No other skin or systemic complaints except as noted in HPI or Assessment and Plan.  Objective  Well appearing patient in no apparent distress; mood and affect are within normal limits.  A full examination was performed including scalp, head, eyes, ears, nose, lips, neck, chest, axillae, abdomen, back, buttocks, bilateral upper extremities, bilateral lower extremities, hands, feet, fingers, toes, fingernails, and toenails. All findings within normal limits unless otherwise noted below.  face Scattered tan macules.   left lower eyelid Erythematous 0.25 cm papule      Mid Back Itching     Assessment & Plan  Lentigines face  With SK's  Samples of Effaclar given to patient to use nightly as tolerated.   Discussed option of treatment with the Perfect Derma Peel  Neoplasm of skin left lower eyelid  Patient will schedule for biopsy after holidays.   R/o Irritated Skin Tag vs ISK vs Other  Notalgia paresthetica Mid Back  Recommend OTC Gold Bond Rapid Relief Anti-Itch cream (pramoxine + menthol), CeraVe Anti-itch cream or lotion (pramoxine), Sarna lotion (Original- menthol + camphor or Sensitive- pramoxine) or Eucerin 12 hour Itch Relief lotion (menthol) up to 3 times  per day to areas on body that are itchy.    Lentigines - Scattered tan macules - Due to sun exposure - Benign-appearing, observe - Recommend daily broad spectrum sunscreen SPF 30+ to sun-exposed areas, reapply every 2 hours as needed. - Call for any changes  Seborrheic Keratoses - Stuck-on, waxy, tan-brown papules and/or plaques  - Benign-appearing - Discussed benign etiology and prognosis. - Observe - Call for any changes  Melanocytic Nevi - Tan-brown and/or pink-flesh-colored symmetric macules and papules - Benign appearing on exam today - Observation - Call clinic for new or changing moles - Recommend daily use of broad spectrum spf 30+ sunscreen to sun-exposed areas.   Hemangiomas - Red papules - Discussed benign nature - Observe - Call for any changes  Actinic Damage - Chronic condition, secondary to cumulative UV/sun exposure - diffuse scaly erythematous macules with underlying dyspigmentation - Recommend daily broad spectrum sunscreen SPF 30+ to sun-exposed areas, reapply every 2 hours as needed.  - Staying in the shade or wearing long sleeves, sun glasses (UVA+UVB protection) and wide brim hats (4-inch brim around the entire circumference of the hat) are also recommended for sun protection.  - Call for new or changing lesions.  Skin cancer screening performed today.  Return in about 1 year (around 07/01/2023) for TBSE, next available for biopsy at left lower eyelid.  Graciella Belton, RMA, am acting as scribe for Forest Gleason, MD .  Documentation: I have reviewed the above documentation for accuracy and completeness, and I agree  with the above.  Forest Gleason, MD

## 2022-07-01 ENCOUNTER — Encounter: Payer: Self-pay | Admitting: Physician Assistant

## 2022-07-01 ENCOUNTER — Ambulatory Visit (INDEPENDENT_AMBULATORY_CARE_PROVIDER_SITE_OTHER): Payer: Self-pay | Admitting: Physician Assistant

## 2022-07-01 VITALS — BP 130/78 | HR 97 | Temp 99.4°F | Ht 66.0 in | Wt 187.6 lb

## 2022-07-01 DIAGNOSIS — R509 Fever, unspecified: Secondary | ICD-10-CM

## 2022-07-01 DIAGNOSIS — R051 Acute cough: Secondary | ICD-10-CM

## 2022-07-01 DIAGNOSIS — J101 Influenza due to other identified influenza virus with other respiratory manifestations: Secondary | ICD-10-CM

## 2022-07-01 LAB — POC SOFIA 2 FLU + SARS ANTIGEN FIA
Influenza A, POC: POSITIVE — AB
Influenza B, POC: NEGATIVE
SARS Coronavirus 2 Ag: NEGATIVE

## 2022-07-01 MED ORDER — BENZONATATE 200 MG PO CAPS: 200.0000 mg | ORAL_CAPSULE | Freq: Three times a day (TID) | ORAL | 0 refills | Status: DC | PRN

## 2022-07-01 MED ORDER — OSELTAMIVIR PHOSPHATE 75 MG PO CAPS: 75.0000 mg | ORAL_CAPSULE | Freq: Two times a day (BID) | ORAL | 0 refills | Status: AC

## 2022-07-01 NOTE — Progress Notes (Signed)
[facility] [address], [place] [address]   Office Visit Note  Patient Name: Michelle Figueroa Date of Birth [id number]  Medical Record number [id number]  Date of Service: 07/01/2022  Chief Complaint  Patient presents with   Cough    Started Mon/ Tue. Was in [place] last week. Low grade fever, cough, ST, left ear pain, runny nose, congested     61 y/o F presents to the clinic for c/o rhinorrhea, cough, nasal congestion, post nasal drainage, boyd aches, fever, and sore throat x 2 days. She denies CP, SOB, or wheezing. No h/o asthma. She returned back from [place] last weekend. She had her influenza vaccine already. Has taken previously prescribed Tessalon with some relief of cough.   Cough Associated symptoms include a fever, postnasal drip, rhinorrhea and a sore throat. Pertinent negatives include no shortness of breath or wheezing.      Current Medication:  Outpatient Encounter Medications as of 07/01/2022  Medication Sig Note   benzonatate (TESSALON) 200 MG capsule Take 1 capsule (200 mg total) by mouth 3 (three) times daily as needed for cough.    lisinopril (ZESTRIL) 30 MG tablet Take 1 tablet (30 mg total) by mouth daily. for blood pressure.    Multiple Vitamin (MULTIVITAMIN) tablet Take 1 tablet by mouth daily.    omeprazole (PRILOSEC) 20 MG capsule Take 1 capsule (20 mg total) by mouth daily. For heartburn    oseltamivir (TAMIFLU) 75 MG capsule Take 1 capsule (75 mg total) by mouth 2 (two) times daily for 5 days.    pravastatin (PRAVACHOL) 40 MG tablet Take 1 tablet (40 mg total) by mouth daily. for cholesterol.    sertraline (ZOLOFT) 100 MG tablet Take 1 tablet (100 mg total) by mouth daily. for anxiety.    VITAMIN D PO Take by mouth.    estradiol (ESTRACE VAGINAL) 0.1 MG/GM vaginal cream 1 gram pv twice weekly    Semaglutide-Weight Management (WEGOVY) 0.25 MG/0.5ML SOAJ Inject 0.25 mg into the skin once a week. (Patient not taking: Reported on 07/01/2022)  01/21/2022: Not currently taking. Waiting for insurance.    No facility-administered encounter medications on file as of 07/01/2022.      Medical History: Past Medical History:  Diagnosis Date   Diabetes mellitus without complication ([place])    followed by PCP   Diverticulosis    noted on colonoscopy   Essential hypertension    GERD (gastroesophageal reflux disease)    Hemorrhoids    Insomnia      Vital Signs: BP 130/78 (BP Location: Left Arm, Patient Position: Sitting, Cuff Size: Normal)   Pulse 97   Temp 99.4 F (37.4 C) (Tympanic)   Ht '5\' 6"'$  (1.676 m)   Wt 187 lb 9.6 oz (85.1 kg)   SpO2 98%   BMI 30.28 kg/m    Review of Systems  Constitutional:  Positive for fever.  HENT:  Positive for congestion, postnasal drip, rhinorrhea and sore throat. Negative for trouble swallowing.   Respiratory:  Positive for cough. Negative for chest tightness, shortness of breath and wheezing.   Gastrointestinal: Negative.   Neurological: Negative.     Physical Exam Constitutional:      Appearance: Normal appearance.  HENT:     Head: Atraumatic.     Right Ear: Tympanic membrane, ear canal and external ear normal.     Left Ear: Tympanic membrane, ear canal and external ear normal.     Nose: Nose normal.     Mouth/Throat:  Mouth: Mucous membranes are moist.     Pharynx: Oropharynx is clear.  Eyes:     Extraocular Movements: Extraocular movements intact.  Cardiovascular:     Rate and Rhythm: Normal rate and regular rhythm.  Pulmonary:     Effort: Pulmonary effort is normal.     Breath sounds: Normal breath sounds.  Musculoskeletal:     Cervical back: Neck supple.  Skin:    General: Skin is warm.  Neurological:     Mental Status: She is alert.  Psychiatric:        Mood and Affect: Mood normal.        Behavior: Behavior normal.        Thought Content: Thought content normal.        Judgment: Judgment normal.       Assessment/Plan:  1. Influenza A - oseltamivir  (TAMIFLU) 75 MG capsule; Take 1 capsule (75 mg total) by mouth 2 (two) times daily for 5 days.  Dispense: 10 capsule; Refill: 0  2. Fever, unspecified fever cause - POC SOFIA 2 FLU + SARS ANTIGEN FIA  3. Acute cough - benzonatate (TESSALON) 200 MG capsule; Take 1 capsule (200 mg total) by mouth 3 (three) times daily as needed for cough.  Dispense: 30 capsule; Refill: 0  Reviewed POSITIVE FLU TYPE A test and negative Covid test with patient. She verbalized understanding. Increase fluids Start a humidifier  May take Tylenol or Ibuprofen for pain if needed Take medicines as prescribed. Reviewed side effects of Tamiflu with patient and  she verbalized understanding.  RTC prn Pt verbalized understanding and in agreement.    General Counseling: Chareese verbalizes understanding of the findings of todays visit and agrees with plan of treatment. I have discussed any further diagnostic evaluation that may be needed or ordered today. We also reviewed her medications today. she has been encouraged to call the office with any questions or concerns that should arise related to todays visit.    Time spent:20 [place], [place] Physician Assistant

## 2022-07-06 DIAGNOSIS — R062 Wheezing: Secondary | ICD-10-CM | POA: Diagnosis not present

## 2022-07-06 DIAGNOSIS — J029 Acute pharyngitis, unspecified: Secondary | ICD-10-CM | POA: Diagnosis not present

## 2022-07-06 DIAGNOSIS — R058 Other specified cough: Secondary | ICD-10-CM | POA: Diagnosis not present

## 2022-07-06 DIAGNOSIS — J101 Influenza due to other identified influenza virus with other respiratory manifestations: Secondary | ICD-10-CM | POA: Diagnosis not present

## 2022-07-06 DIAGNOSIS — J40 Bronchitis, not specified as acute or chronic: Secondary | ICD-10-CM | POA: Diagnosis not present

## 2022-07-06 DIAGNOSIS — Z03818 Encounter for observation for suspected exposure to other biological agents ruled out: Secondary | ICD-10-CM | POA: Diagnosis not present

## 2022-07-07 ENCOUNTER — Encounter: Payer: Self-pay | Admitting: Dermatology

## 2022-07-14 NOTE — Progress Notes (Signed)
Cardiology Office Note:    Date:  07/15/2022   ID:  Michelle Figueroa, DOB [birth year], MRN [id number]  PCP:  Pleas Koch, NP  Cardiologist:  Buford Dresser, MD PhD  Referring MD: Pleas Koch, NP   CC: follow up  History of Present Illness:    Michelle Figueroa is a 62 y.o. female with a hx of HTN, HLD, obesity, type II diabetes who is seen for follow up today. I initially met her 03/20/2019 as a new consult at the request of Pleas Koch, NP for the evaluation and management of CV risk/family history of CV disease.  CV history: Her brother and father both had Mis in their 71s. She has hypertension, hyperlipidemia, diabetes. Calcium score 04/2019 was 21 (84th percentile).  Family history: brother had MI in 79s, father had MI in 36s as well. Both were smokers, father died of lung cancer at age 60. Father had triglyceride problem, brother had cholesterol problem. Possibly grandmother had a stroke, unsure. Both grandmothers lived to old age, one age 83 and one age 16.  At her last visit, she was off diabetes medication and lost >20 lbs. Did [employer] program to get jumpstarted. Cut out processed foods (especially white flour, luncheon meats). Reviewed labs from 03/03/20, lipids were excellent. Discussed that with her calcium on CT, would continue atorvastatin if she is tolerating. Got Covid booster, had more reactions with the third shot (no issues with the first two doses).   She had [facility] ED [facility]/14/20[facility] with LLQ pain and fever. Two days prior she underwent colonoscopy with polyp removal. She was treated for diverticulitis with outpatient management.  Today, she reports developing the flu and bronchitis in the past few weeks. Currently she complains of a lingering cough, but denies any wheezing. She is concerned about the findings noted on her recent chest x-ray, so we reviewed this in detail.  Since her recent illness she has struggled with persistent  indigestion, often waking up with gastric upset. She is now taking omeprazole BID, and her symptoms are improved with Rolaids.   Her blood pressure in clinic is elevated at 144/90 (142/92 on recheck) on 30 mg lisinopril. She confirms some similar readings previously, regardless of feeling ill or well. She does believe she may have some white coat hypertension.  She has recently retired. Her plans include starting to walk 20 minutes for formal exercise. Typically she had walked frequently while working. Regarding her diet she has been cutting back on processed foods. Subsequently she has noticed health improvements including less colonic polyps per her last colonoscopy.  She denies any palpitations, chest pain, shortness of breath, or peripheral edema. No lightheadedness, headaches, syncope, orthopnea, or PND.   Past Medical History:  Diagnosis Date   Diabetes mellitus without complication ([place])    followed by PCP   Diverticulosis    noted on colonoscopy   Essential hypertension    GERD (gastroesophageal reflux disease)    Hemorrhoids    Insomnia     Past Surgical History:  Procedure Laterality Date   APPENDECTOMY  2009   CERVICAL FUSION  2010   CESAREAN SECTION     CHOLECYSTECTOMY  2000   [facility] Suspension Arthroplasty Left 12/25/2020   left thumb with tight rope and tendon interposition   ENDOMETRIAL ABLATION     GYNECOLOGIC CRYOSURGERY     RETROPUBIC SLING  10/2009   Dr. Davis Gourd at [place]  11/19/2009   with  cysto due to urinary retension    Current Medications: Current Outpatient Medications on File Prior to Visit  Medication Sig   estradiol (ESTRACE VAGINAL) 0.1 MG/GM vaginal cream 1 gram pv twice weekly   lisinopril (ZESTRIL) 30 MG tablet Take 1 tablet (30 mg total) by mouth daily. for blood pressure.   Multiple Vitamin (MULTIVITAMIN) tablet Take 1 tablet by mouth daily.   omeprazole (PRILOSEC) 20 MG capsule Take 1 capsule (20 mg total)  by mouth daily. For heartburn   pravastatin (PRAVACHOL) 40 MG tablet Take 1 tablet (40 mg total) by mouth daily. for cholesterol.   sertraline (ZOLOFT) 100 MG tablet Take 50 mg by mouth daily.   VITAMIN D PO Take by mouth.   No current facility-administered medications on file prior to visit.     Allergies:   Erythromycin ethylsuccinate and Oxycodone-acetaminophen   Social History   Tobacco Use   Smoking status: Never   Smokeless tobacco: Never  Vaping Use   Vaping Use: Never used  Substance Use Topics   Alcohol use: Yes    Alcohol/week: 3.0 standard drinks of alcohol    Types: 3 Standard drinks or equivalent per week   Drug use: No    Family History: The patient's family history includes Arthritis in her mother; Colon cancer (age of onset: 21) in her mother; Diabetes in her mother; Heart attack in her father; Heart attack (age of onset: 49) in her brother; Hypertension in her father; Lung cancer in her father. There is no history of Breast cancer.  ROS:   Please see the history of present illness.   (+) Indigestion (+) Cough Additional pertinent ROS otherwise unremarkable.    EKGs/Labs/Other Studies Reviewed:    The following studies were reviewed today: Prior PCP notes.  CT Calcium Scoring  04/20/2019: FINDINGS: Non-cardiac: See separate report from [facility].   Ascending Aorta: Normal size, measuring 32 mm at the mid ascending aorta, measured double oblique at PA bifurcation. No significant calcification.   Pericardium: Normal   Coronary arteries: Arise from normal coronary cusps.  Left dominant   IMPRESSION: Coronary calcium score of 21. This was 84th percentile for age and sex matched control.  Echo  11/08/2016: Study Conclusions  - Left ventricle: The cavity size was normal. There was mild    concentric hypertrophy. Systolic function was normal. The    estimated ejection fraction was in the range of 60% to 65%. Wall    motion was normal;  there were no regional wall motion    abnormalities. Left ventricular diastolic function parameters    were normal.  - Left atrium: The atrium was normal in size.  - Right ventricle: Systolic function was normal.  - Pulmonary arteries: Systolic pressure was within the normal    range.   Nuclear Stress  10/28/2016: Blood pressure demonstrated a normal response to exercise. There was no ST segment deviation noted during stress. No T wave inversion was noted during stress. The study is normal. This is a low risk study. The left ventricular ejection fraction is normal (55-65%).   EKG:  EKG is personally reviewed.   07/15/2022:  NSR at 83 bpm 05/14/2020:  NSR at 64 bpm  Recent Labs: 10/23/2021: ALT 50; BUN 13; Creatinine, Ser 0.79; Hemoglobin 14.8; Platelets 150.0; Potassium 4.0; Sodium 139   Recent Lipid Panel    Component Value Date/Time   CHOL 141 10/23/2021 1021   CHOL 163 08/08/2018 0811   TRIG 180.0 (H) 10/23/2021 1021   HDL  48.80 10/23/2021 1021   HDL 59 08/08/2018 0811   CHOLHDL 3 10/23/2021 1021   VLDL 36.0 10/23/2021 1021   LDLCALC 56 10/23/2021 1021   LDLCALC 76 08/08/2018 0811   LDLDIRECT 75.0 09/04/2019 1154    Physical Exam:    VS:  BP (!) 142/92 (BP Location: Left Arm, Patient Position: Sitting, Cuff Size: Normal)   Pulse 83   Ht _0  (1.676 m)   Wt 181 lb (82.1 kg)   BMI 29.21 kg/m     Wt Readings from Last 3 Encounters:  07/15/22 181 lb (82.1 kg)  07/01/22 187 lb 9.6 oz (85.1 kg)  01/21/22 189 lb 6.4 oz (85.9 kg)    GEN: Well nourished, well developed in no acute distress HEENT: Normal, moist mucous membranes NECK: No JVD CARDIAC: regular rhythm, normal S1 and S2, no rubs or gallops. No murmur. VASCULAR: Radial and DP pulses 2+ bilaterally. No carotid bruits RESPIRATORY:  Clear to auscultation without rales, wheezing or rhonchi  ABDOMEN: Soft, non-tender, non-distended MUSCULOSKELETAL:  Ambulates independently SKIN: Warm and dry, no  edema NEUROLOGIC:  Alert and oriented x 3. No focal neuro deficits noted. PSYCHIATRIC:  Normal affect    ASSESSMENT:    1. Essential hypertension   2. Cardiac risk counseling   3. Counseling on health promotion and disease prevention   4. Coronary artery calcification seen on CT scan   5. Family history of heart disease     PLAN:    Family history of premature CAD Coronary calcium seen on CT scan: -continue statin given her family history and calcium on CT. LDL goal <70, last 56  Type II diabetes Prior obesity -now diet controlled with weight loss, last A1c 5.6 -BMI 29, no longer in obese category -if future medications needed, would consider SGLT2i or GLP1RA given coronary calcium  Hypertension: -not at goal of <130/80 today. Reports better control at home. On lisinopril 30 mg daily -just recently retired and is re-committing to lifestyle. She will monitor BP at home. Reassess in 6 mos  Cardiac risk counseling and prevention recommendations: discussed diet and nutrition recommendations today -recommend heart healthy/Mediterranean diet, with whole grains, fruits, vegetable, fish, lean meats, nuts, and olive oil. Limit salt. -recommend moderate walking, 3-5 times/week for 30-50 minutes each session. Aim for at least 150 minutes.week. Goal should be pace of 3 miles/hours, or walking 1.5 miles in 30 minutes -recommend avoidance of tobacco products. Avoid excess alcohol.  Plan for follow up: 6 months or sooner as needed.  Medication Adjustments/Labs and Tests Ordered: Current medicines are reviewed at length with the patient today.  Concerns regarding medicines are outlined above.   Orders Placed This Encounter  Procedures   EKG 12-Lead   No orders of the defined types were placed in this encounter.  Patient Instructions  Medication Instructions:  Your physician recommends that you continue on your current medications as directed. Please refer to the Current Medication list  given to you today.   Labwork: NONE  Testing/Procedures: NONE  Follow-Up: 6 MONTHS   Any Other Special Instructions Will Be Listed Below (If Applicable).  how to check blood pressure:  -sit comfortably in a chair, feet uncrossed and flat on floor, for 5-10 minutes  -arm ideally should rest at the level of the heart. However, arm should be relaxed and not tense (for example, do not hold the arm up unsupported)  -avoid exercise, caffeine, and tobacco for at least 30 minutes prior to BP reading  -don't take BP  cuff reading over clothes (always place on skin directly)  -I prefer to know how well the medication is working, so I would like you to take your readings 1-2 hours after taking your blood pressure medication if possible    I,Mathew Stumpf,acting as a scribe for [employer], MD.,have documented all relevant documentation on the behalf of Buford Dresser, MD,as directed by  Buford Dresser, MD while in the presence of Buford Dresser, MD.  I, Buford Dresser, MD, have reviewed all documentation for this visit. The documentation on 07/19/22 for the exam, diagnosis, procedures, and orders are all accurate and complete.   Signed, Buford Dresser, MD PhD 07/15/2022     [place]

## 2022-07-15 ENCOUNTER — Encounter (HOSPITAL_BASED_OUTPATIENT_CLINIC_OR_DEPARTMENT_OTHER): Payer: Self-pay | Admitting: Cardiology

## 2022-07-15 ENCOUNTER — Ambulatory Visit (HOSPITAL_BASED_OUTPATIENT_CLINIC_OR_DEPARTMENT_OTHER): Payer: BC Managed Care – PPO | Admitting: Cardiology

## 2022-07-15 VITALS — BP 142/92 | HR 83 | Ht 66.0 in | Wt 181.0 lb

## 2022-07-15 DIAGNOSIS — Z8249 Family history of ischemic heart disease and other diseases of the circulatory system: Secondary | ICD-10-CM

## 2022-07-15 DIAGNOSIS — I2510 Atherosclerotic heart disease of native coronary artery without angina pectoris: Secondary | ICD-10-CM | POA: Diagnosis not present

## 2022-07-15 DIAGNOSIS — Z7189 Other specified counseling: Secondary | ICD-10-CM | POA: Diagnosis not present

## 2022-07-15 DIAGNOSIS — I10 Essential (primary) hypertension: Secondary | ICD-10-CM

## 2022-07-15 NOTE — Patient Instructions (Addendum)
Medication Instructions:  Your physician recommends that you continue on your current medications as directed. Please refer to the Current Medication list given to you today.   Labwork: NONE  Testing/Procedures: NONE  Follow-Up: 6 MONTHS   Any Other Special Instructions Will Be Listed Below (If Applicable).  how to check blood pressure:  -sit comfortably in a chair, feet uncrossed and flat on floor, for 5-10 minutes  -arm ideally should rest at the level of the heart. However, arm should be relaxed and not tense (for example, do not hold the arm up unsupported)  -avoid exercise, caffeine, and tobacco for at least 30 minutes prior to BP reading  -don't take BP cuff reading over clothes (always place on skin directly)  -I prefer to know how well the medication is working, so I would like you to take your readings 1-2 hours after taking your blood pressure medication if possible

## 2022-07-19 ENCOUNTER — Encounter (HOSPITAL_BASED_OUTPATIENT_CLINIC_OR_DEPARTMENT_OTHER): Payer: Self-pay | Admitting: Cardiology

## 2022-07-20 ENCOUNTER — Ambulatory Visit (INDEPENDENT_AMBULATORY_CARE_PROVIDER_SITE_OTHER): Payer: Self-pay | Admitting: Dermatology

## 2022-07-20 ENCOUNTER — Encounter: Payer: Self-pay | Admitting: Dermatology

## 2022-07-20 DIAGNOSIS — L988 Other specified disorders of the skin and subcutaneous tissue: Secondary | ICD-10-CM

## 2022-07-20 NOTE — Patient Instructions (Signed)
Due to recent changes in healthcare laws, you may see results of your pathology and/or laboratory studies on MyChart before the doctors have had a chance to review them. We understand that in some cases there may be results that are confusing or concerning to you. Please understand that not all results are received at the same time and often the doctors may need to interpret multiple results in order to provide you with the best plan of care or course of treatment. Therefore, we ask that you please give us 2 business days to thoroughly review all your results before contacting the office for clarification. Should we see a critical lab result, you will be contacted sooner.   If You Need Anything After Your Visit  If you have any questions or concerns for your doctor, please call our main line at [phone] and press option 4 to reach your doctor's medical assistant. If no one answers, please leave a voicemail as directed and we will return your call as soon as possible. Messages left after 4 pm will be answered the following business day.   You may also send us a message via MyChart. We typically respond to MyChart messages within 1-2 business days.  For prescription refills, please ask your pharmacy to contact our office. Our fax number is [phone].  If you have an urgent issue when the clinic is closed that cannot wait until the next business day, you can page your doctor at the number below.    Please note that while we do our best to be available for urgent issues outside of office hours, we are not available 24/7.   If you have an urgent issue and are unable to reach us, you may choose to seek medical care at your doctor's office, retail clinic, urgent care center, or emergency room.  If you have a medical emergency, please immediately call 911 or go to the emergency department.  Pager Numbers  - Dr. Kowalski: [phone]  - Dr. Moye: [phone]  - Dr. Stewart:  [phone]  In the event of inclement weather, please call our main line at [phone] for an update on the status of any delays or closures.  Dermatology Medication Tips: Please keep the boxes that topical medications come in in order to help keep track of the instructions about where and how to use these. Pharmacies typically print the medication instructions only on the boxes and not directly on the medication tubes.   If your medication is too expensive, please contact our office at [phone] option 4 or send us a message through MyChart.   We are unable to tell what your co-pay for medications will be in advance as this is different depending on your insurance coverage. However, we may be able to find a substitute medication at lower cost or fill out paperwork to get insurance to cover a needed medication.   If a prior authorization is required to get your medication covered by your insurance company, please allow us 1-2 business days to complete this process.  Drug prices often vary depending on where the prescription is filled and some pharmacies may offer cheaper prices.  The website www.goodrx.com contains coupons for medications through different pharmacies. The prices here do not account for what the cost may be with help from insurance (it may be cheaper with your insurance), but the website can give you the price if you did not use any insurance.  - You can print the associated coupon and take it with   your prescription to the pharmacy.  - You may also stop by our office during regular business hours and pick up a GoodRx coupon card.  - If you need your prescription sent electronically to a different pharmacy, notify our office through [facility] MyChart or by phone at [phone] option 4.     Si Usted Necesita Algo Despus de Su Visita  Tambin puede enviarnos un mensaje a travs de MyChart. Por lo general respondemos a los mensajes de MyChart en el transcurso de 1 a 2  das hbiles.  Para renovar recetas, por favor pida a su farmacia que se ponga en contacto con nuestra oficina. Nuestro nmero de fax es el [phone].  Si tiene un asunto urgente cuando la clnica est cerrada y que no puede esperar hasta el siguiente da hbil, puede llamar/localizar a su doctor(a) al nmero que aparece a continuacin.   Por favor, tenga en cuenta que aunque hacemos todo lo posible para estar disponibles para asuntos urgentes fuera del horario de oficina, no estamos disponibles las 24 horas del da, los 7 das de la semana.   Si tiene un problema urgente y no puede comunicarse con nosotros, puede optar por buscar atencin mdica  en el consultorio de su doctor(a), en una clnica privada, en un centro de atencin urgente o en una sala de emergencias.  Si tiene una emergencia mdica, por favor llame inmediatamente al 911 o vaya a la sala de emergencias.  Nmeros de bper  - Dr. Kowalski: [phone]  - Dra. Moye: [phone]  - Dra. Stewart: [phone]  En caso de inclemencias del tiempo, por favor llame a nuestra lnea principal al [phone] para una actualizacin sobre el estado de cualquier retraso o cierre.  Consejos para la medicacin en dermatologa: Por favor, guarde las cajas en las que vienen los medicamentos de uso tpico para ayudarle a seguir las instrucciones sobre dnde y cmo usarlos. Las farmacias generalmente imprimen las instrucciones del medicamento slo en las cajas y no directamente en los tubos del medicamento.   Si su medicamento es muy caro, por favor, pngase en contacto con nuestra oficina llamando al [phone] y presione la opcin 4 o envenos un mensaje a travs de MyChart.   No podemos decirle cul ser su copago por los medicamentos por adelantado ya que esto es diferente dependiendo de la cobertura de su seguro. Sin embargo, es posible que podamos encontrar un medicamento sustituto a menor costo o llenar un formulario para que el  seguro cubra el medicamento que se considera necesario.   Si se requiere una autorizacin previa para que su compaa de seguros cubra su medicamento, por favor permtanos de 1 a 2 das hbiles para completar este proceso.  Los precios de los medicamentos varan con frecuencia dependiendo del lugar de dnde se surte la receta y alguna farmacias pueden ofrecer precios ms baratos.  El sitio web www.goodrx.com tiene cupones para medicamentos de diferentes farmacias. Los precios aqu no tienen en cuenta lo que podra costar con la ayuda del seguro (puede ser ms barato con su seguro), pero el sitio web puede darle el precio si no utiliz ningn seguro.  - Puede imprimir el cupn correspondiente y llevarlo con su receta a la farmacia.  - Tambin puede pasar por nuestra oficina durante el horario de atencin regular y recoger una tarjeta de cupones de GoodRx.  - Si necesita que su receta se enve electrnicamente a una farmacia diferente, informe a nuestra oficina a travs de MyChart de [place]   o por telfono llamando al [phone] y presione la opcin 4.  

## 2022-07-20 NOTE — Progress Notes (Signed)
   Follow-Up Visit   Subjective  Michelle Figueroa is a 62 y.o. female who presents for the following: Facial Elastosis (Face, pt presents for botox today).  The following portions of the chart were reviewed this encounter and updated as appropriate:   Tobacco  Allergies  Meds  Problems  Med Hx  Surg Hx  Fam Hx     Review of Systems:  No other skin or systemic complaints except as noted in HPI or Assessment and Plan.  Objective  Well appearing patient in no apparent distress; mood and affect are within normal limits.  A focused examination was performed including face. Relevant physical exam findings are noted in the Assessment and Plan.  face Rhytides and volume loss.       Assessment & Plan  Elastosis of skin face  Botox 27.5 units injected today to: - Frown complex 25 - Forehead 2.5 units (1.25 u each side superior medial forehead)  Botox Injection - face Location: frown complex, forehead  Informed consent: Discussed risks (infection, pain, bleeding, bruising, swelling, allergic reaction, paralysis of nearby muscles, eyelid droop, double vision, neck weakness, difficulty breathing, headache, undesirable cosmetic result, and need for additional treatment) and benefits of the procedure, as well as the alternatives.  Informed consent was obtained.  Preparation: The area was cleansed with alcohol.  Procedure Details:  Botox was injected into the dermis with a 30-gauge needle. Pressure applied to any bleeding. Ice packs offered for swelling.  Lot Number:  [id number] Expiration:  08/2024  Total Units Injected:  27.5  Plan: Patient was instructed to remain upright for 4 hours. Patient was instructed to avoid massaging the face and avoid vigorous exercise for the rest of the day. Tylenol may be used for headache.  Allow 2 weeks before returning to clinic for additional dosing as needed. Patient will call for any problems.  Return for 3-79mBotox.  I, SOthelia Pulling RMA,  am acting as scribe for DSarina Ser MD . Documentation: I have reviewed the above documentation for accuracy and completeness, and I agree with the above.  DSarina Ser MD

## 2022-08-05 ENCOUNTER — Other Ambulatory Visit: Payer: Self-pay | Admitting: Primary Care

## 2022-08-05 DIAGNOSIS — I10 Essential (primary) hypertension: Secondary | ICD-10-CM

## 2022-08-05 DIAGNOSIS — E7800 Pure hypercholesterolemia, unspecified: Secondary | ICD-10-CM

## 2022-08-05 NOTE — Telephone Encounter (Signed)
Called and spoke to patient, she states she has a couple weeks left of pills in her current bottles and she has a refill at the pharmacy ready to pick up.  The pharmacy submitted this request automatically, and doesn't need to be filled right now.  Scheduled cpe 10/26/22

## 2022-08-05 NOTE — Telephone Encounter (Signed)
Received refill requests for pravastatin and lisinopril, she should have at least a 90 day fill at the pharmacy. Is she out?  Also needs CPE/follow up scheduled for April. See last CPE date.

## 2022-08-18 ENCOUNTER — Other Ambulatory Visit: Payer: Self-pay | Admitting: Primary Care

## 2022-08-18 DIAGNOSIS — K219 Gastro-esophageal reflux disease without esophagitis: Secondary | ICD-10-CM

## 2022-08-19 ENCOUNTER — Telehealth: Payer: Self-pay | Admitting: Physician Assistant

## 2022-08-19 DIAGNOSIS — B9789 Other viral agents as the cause of diseases classified elsewhere: Secondary | ICD-10-CM

## 2022-08-19 DIAGNOSIS — J019 Acute sinusitis, unspecified: Secondary | ICD-10-CM

## 2022-08-19 MED ORDER — IPRATROPIUM BROMIDE 0.03 % NA SOLN: 2.0000 | Freq: Two times a day (BID) | NASAL | 0 refills | Status: DC

## 2022-08-19 NOTE — Progress Notes (Signed)
Message sent to patient requesting further input regarding current symptoms. Awaiting patient response.  

## 2022-08-19 NOTE — Progress Notes (Signed)
E-Visit for Sinus Problems  We are sorry that you are not feeling well.  Here is how we plan to help!  Based on what you have shared with me it looks like you have sinusitis.  Sinusitis is inflammation and infection in the sinus cavities of the head.  Based on your presentation I believe you most likely have Acute Viral Sinusitis.This is an infection most likely caused by a virus. There is not specific treatment for viral sinusitis other than to help you with the symptoms until the infection runs its course.  You may use an oral decongestant such as Mucinex D or if you have glaucoma or high blood pressure use plain Mucinex. Saline nasal spray help and can safely be used as often as needed for congestion, I have prescribed: Ipratropium Bromide nasal spray 0.03% 2 sprays in eah nostril 2-3 times a day. Please stop the Afrin as if used for more than 2 days it causes rebound congestion.  Some authorities believe that zinc sprays or the use of Echinacea may shorten the course of your symptoms.  Sinus infections are not as easily transmitted as other respiratory infection, however we still recommend that you avoid close contact with loved ones, especially the very young and elderly.  Remember to wash your hands thoroughly throughout the day as this is the number one way to prevent the spread of infection!  Home Care: Only take medications as instructed by your medical team. Do not take these medications with alcohol. A steam or ultrasonic humidifier can help congestion.  You can place a towel over your head and breathe in the steam from hot water coming from a faucet. Avoid close contacts especially the very young and the elderly. Cover your mouth when you cough or sneeze. Always remember to wash your hands.  Get Help Right Away If: You develop worsening fever or sinus pain. You develop a severe head ache or visual changes. Your symptoms persist after you have completed your treatment plan.  Make  sure you Understand these instructions. Will watch your condition. Will get help right away if you are not doing well or get worse.   Thank you for choosing an e-visit.  Your e-visit answers were reviewed by a board certified advanced clinical practitioner to complete your personal care plan. Depending upon the condition, your plan could have included both over the counter or prescription medications.  Please review your pharmacy choice. Make sure the pharmacy is open so you can pick up prescription now. If there is a problem, you may contact your provider through [employer] and have the prescription routed to another pharmacy.  Your safety is important to [place]. If you have drug allergies check your prescription carefully.   For the next 24 hours you can use MyChart to ask questions about today's visit, request a non-urgent call back, or ask for a work or school excuse. You will get an email in the next two days asking about your experience. I hope that your e-visit has been valuable and will speed your recovery.

## 2022-08-19 NOTE — Progress Notes (Signed)
I have spent 5 minutes in review of e-visit questionnaire, review and updating patient chart, medical decision making and response to patient.   Michelle Alviar Cody Tymeka Privette, PA-C    

## 2022-08-19 NOTE — Telephone Encounter (Signed)
Lvmtcb, sent mychart message  

## 2022-08-19 NOTE — Telephone Encounter (Signed)
Patient is due for CPE/follow up in late April, this will be required prior to any further refills.  Please schedule, thank you!

## 2022-08-23 NOTE — Telephone Encounter (Signed)
Pt's cpe scheduled for 10/26/22

## 2022-08-30 ENCOUNTER — Ambulatory Visit: Payer: Self-pay | Admitting: Primary Care

## 2022-08-31 ENCOUNTER — Ambulatory Visit: Payer: Self-pay | Admitting: Primary Care

## 2022-09-29 ENCOUNTER — Ambulatory Visit: Payer: BC Managed Care – PPO | Admitting: Dermatology

## 2022-09-29 VITALS — BP 158/101 | HR 88

## 2022-09-29 DIAGNOSIS — L720 Epidermal cyst: Secondary | ICD-10-CM | POA: Diagnosis not present

## 2022-09-29 DIAGNOSIS — L821 Other seborrheic keratosis: Secondary | ICD-10-CM

## 2022-09-29 DIAGNOSIS — D485 Neoplasm of uncertain behavior of skin: Secondary | ICD-10-CM | POA: Diagnosis not present

## 2022-09-29 DIAGNOSIS — L814 Other melanin hyperpigmentation: Secondary | ICD-10-CM

## 2022-09-29 DIAGNOSIS — L578 Other skin changes due to chronic exposure to nonionizing radiation: Secondary | ICD-10-CM | POA: Diagnosis not present

## 2022-09-29 DIAGNOSIS — L918 Other hypertrophic disorders of the skin: Secondary | ICD-10-CM | POA: Diagnosis not present

## 2022-09-29 NOTE — Progress Notes (Signed)
   Follow-Up Visit   Subjective  ASLEY MAPA is a 62 y.o. female who presents for the following: Irritated skin lesion (On the L lower eyelid, patient here today for biopsy). Brown spots on the face, patient would like to discuss treatment options.   The following portions of the chart were reviewed this encounter and updated as appropriate:   Tobacco  Allergies  Meds  Problems  Med Hx  Surg Hx  Fam Hx      Review of Systems:  No other skin or systemic complaints except as noted in HPI or Assessment and Plan.  Objective  Well appearing patient in no apparent distress; mood and affect are within normal limits.  A focused examination was performed including the face. Relevant physical exam findings are noted in the Assessment and Plan.  L lower eyelid 0.25 cm erythematous papule.       Assessment & Plan  Neoplasm of uncertain behavior of skin L lower eyelid  Epidermal / dermal shaving  Lesion diameter (cm):  0.2 Informed consent: discussed and consent obtained   Timeout: patient name, date of birth, surgical site, and procedure verified   Procedure prep:  Patient was prepped and draped in usual sterile fashion Procedure prep: Puracyn. Anesthesia: the lesion was anesthetized in a standard fashion   Anesthetic:  1% lidocaine w/ epinephrine 1-100,000 buffered w/ 8.4% NaHCO3 Instrument used: scissors   Outcome: patient tolerated procedure well   Post-procedure details: sterile dressing applied and wound care instructions given   Dressing type: bandage and petrolatum    Specimen 1 - Surgical pathology Differential Diagnosis: R/o Irritated Skin Tag vs ISK vs Other Small specimen Check Margins: No  R/o Irritated Skin Tag vs ISK vs Other   Lentigines -  - Scattered tan macules - Due to sun exposure - Benign-appearing, observe - Recommend daily broad spectrum sunscreen SPF 30+ to sun-exposed areas, reapply every 2 hours as needed. - Call for any changes -  Recommend Alastin Illuminate BID. Discussed TCA peel during fall/winter months  Seborrheic Keratoses - Stuck-on, waxy, tan-brown papules and/or plaques  - Benign-appearing - Discussed benign etiology and prognosis. - Observe - Call for any changes  Actinic Damage - chronic, secondary to cumulative UV radiation exposure/sun exposure over time - diffuse scaly erythematous macules with underlying dyspigmentation - Recommend daily broad spectrum sunscreen SPF 30+ to sun-exposed areas, reapply every 2 hours as needed.  - Recommend staying in the shade or wearing long sleeves, sun glasses (UVA+UVB protection) and wide brim hats (4-inch brim around the entire circumference of the hat). - Call for new or changing lesions.  Milia - tiny firm white papules - type of cyst - benign - may be extracted if symptomatic - observe - Sample given of Aklief apply a thin coat QHS PRN  Return for appointment as scheduled.  Luther Redo, CMA, am acting as scribe for Forest Gleason, MD .  Documentation: I have reviewed the above documentation for accuracy and completeness, and I agree with the above.  Forest Gleason, MD

## 2022-09-29 NOTE — Patient Instructions (Signed)
Wound Care Instructions  Cleanse wound gently with soap and water once a day then pat dry with clean gauze. Apply a thin coat of Petrolatum (petroleum jelly, "Vaseline") over the wound (unless you have an allergy to this). We recommend that you use a new, sterile tube of Vaseline. Do not pick or remove scabs. Do not remove the yellow or white "healing tissue" from the base of the wound.  Cover the wound with fresh, clean, nonstick gauze and secure with paper tape. You may use Band-Aids in place of gauze and tape if the wound is small enough, but would recommend trimming much of the tape off as there is often too much. Sometimes Band-Aids can irritate the skin.  You should call the office for your biopsy report after 1 week if you have not already been contacted.  If you experience any problems, such as abnormal amounts of bleeding, swelling, significant bruising, significant pain, or evidence of infection, please call the office immediately.  FOR ADULT SURGERY PATIENTS: If you need something for pain relief you may take 1 extra strength Tylenol (acetaminophen) AND 2 Ibuprofen (200mg each) together every 4 hours as needed for pain. (do not take these if you are allergic to them or if you have a reason you should not take them.) Typically, you may only need pain medication for 1 to 3 days.     Due to recent changes in healthcare laws, you may see results of your pathology and/or laboratory studies on MyChart before the doctors have had a chance to review them. We understand that in some cases there may be results that are confusing or concerning to you. Please understand that not all results are received at the same time and often the doctors may need to interpret multiple results in order to provide you with the best plan of care or course of treatment. Therefore, we ask that you please give us 2 business days to thoroughly review all your results before contacting the office for clarification. Should  we see a critical lab result, you will be contacted sooner.   If You Need Anything After Your Visit  If you have any questions or concerns for your doctor, please call our main line at [phone] and press option 4 to reach your doctor's medical assistant. If no one answers, please leave a voicemail as directed and we will return your call as soon as possible. Messages left after 4 pm will be answered the following business day.   You may also send us a message via MyChart. We typically respond to MyChart messages within 1-2 business days.  For prescription refills, please ask your pharmacy to contact our office. Our fax number is [phone].  If you have an urgent issue when the clinic is closed that cannot wait until the next business day, you can page your doctor at the number below.    Please note that while we do our best to be available for urgent issues outside of office hours, we are not available 24/7.   If you have an urgent issue and are unable to reach us, you may choose to seek medical care at your doctor's office, retail clinic, urgent care center, or emergency room.  If you have a medical emergency, please immediately call 911 or go to the emergency department.  Pager Numbers  - Dr. Kowalski: [phone]  - Dr. Moye: [phone]  - Dr. Stewart: [phone]  In the event of inclement weather, please call our main line at   [phone] for an update on the status of any delays or closures.  Dermatology Medication Tips: Please keep the boxes that topical medications come in in order to help keep track of the instructions about where and how to use these. Pharmacies typically print the medication instructions only on the boxes and not directly on the medication tubes.   If your medication is too expensive, please contact our office at [phone] option 4 or send us a message through MyChart.   We are unable to tell what your co-pay for medications will be in  advance as this is different depending on your insurance coverage. However, we may be able to find a substitute medication at lower cost or fill out paperwork to get insurance to cover a needed medication.   If a prior authorization is required to get your medication covered by your insurance company, please allow us 1-2 business days to complete this process.  Drug prices often vary depending on where the prescription is filled and some pharmacies may offer cheaper prices.  The website www.goodrx.com contains coupons for medications through different pharmacies. The prices here do not account for what the cost may be with help from insurance (it may be cheaper with your insurance), but the website can give you the price if you did not use any insurance.  - You can print the associated coupon and take it with your prescription to the pharmacy.  - You may also stop by our office during regular business hours and pick up a GoodRx coupon card.  - If you need your prescription sent electronically to a different pharmacy, notify our office through [facility] MyChart or by phone at [phone] option 4.     Si Usted Necesita Algo Despus de Su Visita  Tambin puede enviarnos un mensaje a travs de MyChart. Por lo general respondemos a los mensajes de MyChart en el transcurso de 1 a 2 das hbiles.  Para renovar recetas, por favor pida a su farmacia que se ponga en contacto con nuestra oficina. Nuestro nmero de fax es el [phone].  Si tiene un asunto urgente cuando la clnica est cerrada y que no puede esperar hasta el siguiente da hbil, puede llamar/localizar a su doctor(a) al nmero que aparece a continuacin.   Por favor, tenga en cuenta que aunque hacemos todo lo posible para estar disponibles para asuntos urgentes fuera del horario de oficina, no estamos disponibles las 24 horas del da, los 7 das de la semana.   Si tiene un problema urgente y no puede comunicarse con nosotros, puede  optar por buscar atencin mdica  en el consultorio de su doctor(a), en una clnica privada, en un centro de atencin urgente o en una sala de emergencias.  Si tiene una emergencia mdica, por favor llame inmediatamente al 911 o vaya a la sala de emergencias.  Nmeros de bper  - Dr. Kowalski: [phone]  - Dra. Moye: [phone]  - Dra. Stewart: [phone]  En caso de inclemencias del tiempo, por favor llame a nuestra lnea principal al [phone] para una actualizacin sobre el estado de cualquier retraso o cierre.  Consejos para la medicacin en dermatologa: Por favor, guarde las cajas en las que vienen los medicamentos de uso tpico para ayudarle a seguir las instrucciones sobre dnde y cmo usarlos. Las farmacias generalmente imprimen las instrucciones del medicamento slo en las cajas y no directamente en los tubos del medicamento.   Si su medicamento es muy caro, por favor, pngase en contacto con   nuestra oficina llamando al [phone] y presione la opcin 4 o envenos un mensaje a travs de MyChart.   No podemos decirle cul ser su copago por los medicamentos por adelantado ya que esto es diferente dependiendo de la cobertura de su seguro. Sin embargo, es posible que podamos encontrar un medicamento sustituto a menor costo o llenar un formulario para que el seguro cubra el medicamento que se considera necesario.   Si se requiere una autorizacin previa para que su compaa de seguros cubra su medicamento, por favor permtanos de 1 a 2 das hbiles para completar este proceso.  Los precios de los medicamentos varan con frecuencia dependiendo del lugar de dnde se surte la receta y alguna farmacias pueden ofrecer precios ms baratos.  El sitio web www.goodrx.com tiene cupones para medicamentos de diferentes farmacias. Los precios aqu no tienen en cuenta lo que podra costar con la ayuda del seguro (puede ser ms barato con su seguro), pero el sitio web puede darle el  precio si no utiliz ningn seguro.  - Puede imprimir el cupn correspondiente y llevarlo con su receta a la farmacia.  - Tambin puede pasar por nuestra oficina durante el horario de atencin regular y recoger una tarjeta de cupones de GoodRx.  - Si necesita que su receta se enve electrnicamente a una farmacia diferente, informe a nuestra oficina a travs de MyChart de [facility] o por telfono llamando al [phone] y presione la opcin 4.  

## 2022-09-30 ENCOUNTER — Encounter: Payer: Self-pay | Admitting: Dermatology

## 2022-10-04 ENCOUNTER — Encounter: Payer: Self-pay | Admitting: Dermatology

## 2022-10-05 ENCOUNTER — Telehealth: Payer: Self-pay

## 2022-10-05 NOTE — Telephone Encounter (Signed)
Patient advised pathology showed benign skin tag. Lurlean Horns., RMA

## 2022-10-05 NOTE — Telephone Encounter (Signed)
-----   Message from Alfonso Patten, MD sent at 10/05/2022  9:46 AM EDT ----- Skin , left lower eyelid [place] --> Skin tag, benign, no additional treatment needed.     MAs please call. Thank you!

## 2022-10-07 ENCOUNTER — Other Ambulatory Visit: Payer: Self-pay

## 2022-10-07 MED ORDER — TRETINOIN 0.025 % EX CREA: TOPICAL_CREAM | CUTANEOUS | 3 refills | Status: AC

## 2022-10-07 NOTE — Telephone Encounter (Signed)
Please send rx for tretinoin 0.025% cream to use nightly.   Please send [place] the counseling info as well - Topical retinoid medications like tretinoin/Retin-A, adapalene/Differin, tazarotene/Fabior, and Epiduo/Epiduo Forte can cause dryness and irritation when first started. Only apply a pea-sized amount to the entire affected area. Avoid applying it around the eyes, edges of mouth and creases at the nose. If you experience irritation, use a good moisturizer first and/or apply the medicine less often. If you are doing well with the medicine, you can increase how often you use it until you are applying every night. Be careful with sun protection while using this medication as it can make you sensitive to the sun. This medicine should not be used by pregnant women.   Thank you!

## 2022-10-07 NOTE — Progress Notes (Signed)
Tretinoin sent to pharmacy. Patient advised via MyChart.

## 2022-10-26 ENCOUNTER — Encounter: Payer: BC Managed Care – PPO | Admitting: Primary Care

## 2022-10-26 ENCOUNTER — Ambulatory Visit: Payer: BC Managed Care – PPO | Admitting: Primary Care

## 2022-10-26 ENCOUNTER — Encounter: Payer: Self-pay | Admitting: Primary Care

## 2022-10-26 VITALS — BP 122/84 | HR 108 | Temp 98.0°F | Ht 66.0 in | Wt 185.0 lb

## 2022-10-26 DIAGNOSIS — E7800 Pure hypercholesterolemia, unspecified: Secondary | ICD-10-CM | POA: Diagnosis not present

## 2022-10-26 DIAGNOSIS — E1165 Type 2 diabetes mellitus with hyperglycemia: Secondary | ICD-10-CM | POA: Diagnosis not present

## 2022-10-26 DIAGNOSIS — Z0001 Encounter for general adult medical examination with abnormal findings: Secondary | ICD-10-CM | POA: Diagnosis not present

## 2022-10-26 DIAGNOSIS — F411 Generalized anxiety disorder: Secondary | ICD-10-CM

## 2022-10-26 DIAGNOSIS — K219 Gastro-esophageal reflux disease without esophagitis: Secondary | ICD-10-CM

## 2022-10-26 DIAGNOSIS — R197 Diarrhea, unspecified: Secondary | ICD-10-CM

## 2022-10-26 DIAGNOSIS — Z86010 Personal history of colonic polyps: Secondary | ICD-10-CM

## 2022-10-26 DIAGNOSIS — I10 Essential (primary) hypertension: Secondary | ICD-10-CM

## 2022-10-26 HISTORY — DX: Diarrhea, unspecified: R19.7

## 2022-10-26 LAB — COMPREHENSIVE METABOLIC PANEL
ALT: 56 U/L — ABNORMAL HIGH (ref 0–35)
AST: 68 U/L — ABNORMAL HIGH (ref 0–37)
Albumin: 4.3 g/dL (ref 3.5–5.2)
Alkaline Phosphatase: 100 U/L (ref 39–117)
BUN: 18 mg/dL (ref 6–23)
CO2: 24 mEq/L (ref 19–32)
Calcium: 9.6 mg/dL (ref 8.4–10.5)
Chloride: 105 mEq/L (ref 96–112)
Creatinine, Ser: 0.85 mg/dL (ref 0.40–1.20)
GFR: 74.45 mL/min (ref >60.00)
Glucose, Bld: 72 mg/dL (ref 70–99)
Potassium: 3.9 mEq/L (ref 3.5–5.1)
Sodium: 136 mEq/L (ref 135–145)
Total Bilirubin: 0.7 mg/dL (ref 0.2–1.2)
Total Protein: 7.4 g/dL (ref 6.0–8.3)

## 2022-10-26 LAB — CBC WITH DIFFERENTIAL/PLATELET
Basophils Absolute: 0 10*3/uL (ref 0.0–0.1)
Basophils Relative: 0.7 % (ref 0.0–3.0)
Eosinophils Absolute: 0 10*3/uL (ref 0.0–0.7)
Eosinophils Relative: 0.7 % (ref 0.0–5.0)
HCT: 49.4 % — ABNORMAL HIGH (ref 36.0–46.0)
Hemoglobin: 17 g/dL — ABNORMAL HIGH (ref 12.0–15.0)
Lymphocytes Relative: 40.1 % (ref 12.0–46.0)
Lymphs Abs: 2.2 10*3/uL (ref 0.7–4.0)
MCHC: 34.3 g/dL (ref 30.0–36.0)
MCV: 87 fl (ref 78.0–100.0)
Monocytes Absolute: 0.8 10*3/uL (ref 0.1–1.0)
Monocytes Relative: 14.2 % — ABNORMAL HIGH (ref 3.0–12.0)
Neutro Abs: 2.4 10*3/uL (ref 1.4–7.7)
Neutrophils Relative %: 44.3 % (ref 43.0–77.0)
Platelets: 168 10*3/uL (ref 150.0–400.0)
RBC: 5.68 Mil/uL — ABNORMAL HIGH (ref 3.87–5.11)
RDW: 13.9 % (ref 11.5–15.5)
WBC: 5.5 10*3/uL (ref 4.0–10.5)

## 2022-10-26 LAB — MICROALBUMIN / CREATININE URINE RATIO
Creatinine,U: 192.6 mg/dL
Microalb Creat Ratio: 1.2 mg/g (ref 0.0–30.0)
Microalb, Ur: 2.3 mg/dL — ABNORMAL HIGH (ref 0.0–1.9)

## 2022-10-26 LAB — LIPID PANEL
Cholesterol: 125 mg/dL (ref 0–200)
HDL: 44.2 mg/dL (ref >39.00)
LDL Cholesterol: 48 mg/dL (ref 0–99)
NonHDL: 80.83
Total CHOL/HDL Ratio: 3
Triglycerides: 162 mg/dL — ABNORMAL HIGH (ref 0.0–149.0)
VLDL: 32.4 mg/dL (ref 0.0–40.0)

## 2022-10-26 LAB — HEMOGLOBIN A1C: Hgb A1c MFr Bld: 5.8 % (ref 4.6–6.5)

## 2022-10-26 MED ORDER — PROMETHAZINE HCL 12.5 MG PO TABS: 12.5000 mg | ORAL_TABLET | Freq: Three times a day (TID) | ORAL | 0 refills | Status: DC | PRN

## 2022-10-26 NOTE — Assessment & Plan Note (Addendum)
Repeat A1C pending. Urine microalbumin pending. Foot exam today.  Remain off treatment.

## 2022-10-26 NOTE — Assessment & Plan Note (Signed)
Colonoscopy UTD, due in 2028 

## 2022-10-26 NOTE — Patient Instructions (Signed)
Stop by the lab prior to leaving today. I will notify you of your results once received.   Use the promethazine as needed for nausea.   It was a pleasure to see you today!

## 2022-10-26 NOTE — Assessment & Plan Note (Signed)
Controlled.  Continue lisinopril 30 mg daily. CMP pending. 

## 2022-10-26 NOTE — Assessment & Plan Note (Signed)
Controlled.  Continue Zoloft 50 mg. She is taking 1/2 of 100 mg daily.

## 2022-10-26 NOTE — Assessment & Plan Note (Signed)
Controlled.   Continue omeprazole 20 mg daily. 

## 2022-10-26 NOTE — Assessment & Plan Note (Signed)
Continue pravastatin 40 mg daily. Repeat lipid panel pending 

## 2022-10-26 NOTE — Progress Notes (Signed)
Subjective:    Patient ID: Michelle Figueroa, female    DOB: 09-27-1960, 62 y.o.   MRN: [id number]  Diarrhea  Pertinent negatives include no abdominal pain, arthralgias, coughing, headaches, myalgias or vomiting.    Michelle Figueroa is a very pleasant 62 y.o. female who presents today for complete physical and follow up of chronic conditions.  She would also like to discuss acute diarrhea.  Symptom onset three days ago with nausea and multiple episodes of diarrhea. She's also developed fevers (tmax of 100.4), chills, body aches, headaches, fatigue. She's been in bed for the last few days given her symptoms. Her granddaughter had the same symptoms last week. She's experiencing 15-20 episodes of diarrhea daily, liquid/watery, nausea without vomiting. She hasn't had much fluid intake until today. She's been sipping Ginger Ale and eating crackers mostly. Today she's had 32 oz of water today. Today she's only had 2 episodes of diarrhea. Her last fever was yesterday. She's taken promethazine with improvement in nausea.   Immunizations: -Tetanus: Completed in 2017 -Influenza: Completed this season -Shingles: Completed Shingrix x 1 dose.  -Pneumonia: Completed in 2020  Diet: Fair diet.  Exercise: Walking   Eye exam: Completes annually  Dental exam: Completes semi-annually    Pap Smear: July 2023 Mammogram: September 2023  Colonoscopy: Completed in 2023, due 2028  BP Readings from Last 3 Encounters:  10/26/22 122/84  09/29/22 (!) 158/101  07/15/22 (!) 142/92     Review of Systems  Constitutional:  Negative for unexpected weight change.  HENT:  Negative for rhinorrhea.   Respiratory:  Negative for cough and shortness of breath.   Cardiovascular:  Negative for chest pain.  Gastrointestinal:  Positive for diarrhea and nausea. Negative for abdominal pain, constipation and vomiting.  Genitourinary:  Negative for difficulty urinating.  Musculoskeletal:  Negative for arthralgias and  myalgias.  Skin:  Negative for rash.  Allergic/Immunologic: Negative for environmental allergies.  Neurological:  Negative for dizziness and headaches.  Psychiatric/Behavioral:  The patient is not nervous/anxious.          Past Medical History:  Diagnosis Date   Diabetes mellitus without complication    followed by PCP   Diverticulosis    noted on colonoscopy   Essential hypertension    GERD (gastroesophageal reflux disease)    Hemorrhoids    Insomnia     Social History   Socioeconomic History   Marital status: Married    Spouse name: Not on file   Number of children: Not on file   Years of education: Not on file   Highest education level: Not on file  Occupational History   Not on file  Tobacco Use   Smoking status: Never   Smokeless tobacco: Never  Vaping Use   Vaping Use: Never used  Substance and Sexual Activity   Alcohol use: Yes    Alcohol/week: 3.0 standard drinks of alcohol    Types: 3 Standard drinks or equivalent per week   Drug use: No   Sexual activity: Yes    Birth control/protection: Post-menopausal  Other Topics Concern   Not on file  Social History Narrative   Married.   2 children.   Works in [employer] at [employer].    Enjoys traveling, spending time with family.    Social Determinants of Health   Financial Resource Strain: Not on file  Food Insecurity: Not on file  Transportation Needs: Not on file  Physical Activity: Not on file  Stress: Not on file  Social Connections: Not on file  Intimate Partner Violence: Not on file    Past Surgical History:  Procedure Laterality Date   APPENDECTOMY  2009   CERVICAL FUSION  2010   CESAREAN SECTION     CHOLECYSTECTOMY  2000   [facility] Suspension Arthroplasty Left 12/25/2020   left thumb with tight rope and tendon interposition   ENDOMETRIAL ABLATION     GYNECOLOGIC CRYOSURGERY     RETROPUBIC SLING  10/2009   Dr. Barnabas Lister at [facility]   REVISION URINARY SLING  11/19/2009    with cysto due to urinary retension    Family History  Problem Relation Age of Onset   Arthritis Mother    Diabetes Mother    Colon cancer Mother 18   Lung cancer Father    Hypertension Father    Heart attack Father    Heart attack Brother 101   Breast cancer Neg Hx     Allergies  Allergen Reactions   Erythromycin Ethylsuccinate Rash   Oxycodone-Acetaminophen Nausea Only    Current Outpatient Medications on File Prior to Visit  Medication Sig Dispense Refill   estradiol (ESTRACE VAGINAL) 0.1 MG/GM vaginal cream 1 gram pv twice weekly 42.5 g 3   ipratropium (ATROVENT) 0.03 % nasal spray Place 2 sprays into both nostrils every 12 (twelve) hours. 30 mL 0   lisinopril (ZESTRIL) 30 MG tablet Take 1 tablet (30 mg total) by mouth daily. for blood pressure. 90 tablet 3   omeprazole (PRILOSEC) 20 MG capsule Take 1 capsule (20 mg total) by mouth daily. For heartburn 90 capsule 3   pravastatin (PRAVACHOL) 40 MG tablet Take 1 tablet (40 mg total) by mouth daily. for cholesterol. 90 tablet 3   sertraline (ZOLOFT) 100 MG tablet Take 50 mg by mouth daily.     tretinoin (RETIN-A) 0.025 % cream Apply pea-sized amount to face at bedtime, wash off in morning. 20 g 3   Multiple Vitamin (MULTIVITAMIN) tablet Take 1 tablet by mouth daily. (Patient not taking: Reported on 10/26/2022)     VITAMIN D PO Take by mouth. (Patient not taking: Reported on 10/26/2022)     No current facility-administered medications on file prior to visit.    BP 122/84   Pulse (!) 108   Temp 98 F (36.7 C) (Temporal)   Ht  (1.676 m)   Wt 185 lb (83.9 kg)   SpO2 98%   BMI 29.86 kg/m  Objective:   Physical Exam Constitutional:      Appearance: She is not ill-appearing.  HENT:     Right Ear: Tympanic membrane and ear canal normal.     Left Ear: Tympanic membrane and ear canal normal.     Nose: Nose normal.  Eyes:     Conjunctiva/sclera: Conjunctivae normal.     Pupils: Pupils are equal, round, and reactive to  light.  Neck:     Thyroid: No thyromegaly.  Cardiovascular:     Rate and Rhythm: Normal rate and regular rhythm.     Heart sounds: No murmur heard. Pulmonary:     Effort: Pulmonary effort is normal.     Breath sounds: Normal breath sounds. No rales.  Abdominal:     General: Bowel sounds are normal.     Palpations: Abdomen is soft.     Tenderness: There is no abdominal tenderness.  Musculoskeletal:        General: Normal range of motion.     Cervical back: Neck supple.  Lymphadenopathy:  Cervical: No cervical adenopathy.  Skin:    General: Skin is warm and dry.     Findings: No rash.  Neurological:     Mental Status: She is alert and oriented to person, place, and time.     Cranial Nerves: No cranial nerve deficit.     Deep Tendon Reflexes: Reflexes are normal and symmetric.  Psychiatric:        Mood and Affect: Mood normal.           Assessment & Plan:  Encounter for annual general medical examination with abnormal findings in adult Assessment & Plan: Immunizations UTD. Pap smear UTD, follows with GYN Mammogram UTD Colonoscopy UTD, due 2028  Discussed the importance of a healthy diet and regular exercise in order for weight loss, and to reduce the risk of further co-morbidity.  Exam stable. Labs pending.  Follow up in 1 year for repeat physical.    GAD (generalized anxiety disorder) Assessment & Plan: Controlled.  Continue Zoloft 50 mg. She is taking 1/2 of 100 mg daily.     Gastroesophageal reflux disease, unspecified whether esophagitis present Assessment & Plan: Controlled.  Continue omeprazole 20 mg daily.    Essential hypertension Assessment & Plan: Controlled.  Continue lisinopril 30 mg daily.  CMP pending.   Controlled type 2 diabetes mellitus with hyperglycemia, without long-term current use of insulin Assessment & Plan: Repeat A1C pending. Urine microalbumin pending. Foot exam today.  Remain off treatment.  Orders: -      Hemoglobin A1c -     Microalbumin / creatinine urine ratio  Pure hypercholesterolemia Assessment & Plan: Continue pravastatin 40 mg daily. Repeat lipid panel pending.  Orders: -     Lipid panel  Acute diarrhea Assessment & Plan: Improving based on HPI today.  CBC and CMP pending today. Consider stool studies if symptoms do not continue to improve. She agrees.  Rx for promethazine 12.5 mg provided to use PRN>  Discussed the need to work on hydration. Advance diet as tolerated.   She will update.   Orders: -     Promethazine HCl; Take 1 tablet (12.5 mg total) by mouth every 8 (eight) hours as needed for nausea or vomiting.  Dispense: 15 tablet; Refill: 0 -     Comprehensive metabolic panel -     CBC with Differential/Platelet  Hx of adenomatous colonic polyps Assessment & Plan: Colonoscopy UTD, due in 2028         Doreene Nest, NP

## 2022-10-26 NOTE — Assessment & Plan Note (Signed)
Immunizations UTD. Pap smear UTD, follows with GYN Mammogram UTD Colonoscopy UTD, due 2028  Discussed the importance of a healthy diet and regular exercise in order for weight loss, and to reduce the risk of further co-morbidity.  Exam stable. Labs pending.  Follow up in 1 year for repeat physical.

## 2022-10-26 NOTE — Assessment & Plan Note (Addendum)
Improving based on HPI today.  CBC and CMP pending today. Consider stool studies if symptoms do not continue to improve. She agrees.  Rx for promethazine 12.5 mg provided to use PRN>  Discussed the need to work on hydration. Advance diet as tolerated.   She will update.

## 2022-11-04 DIAGNOSIS — K648 Other hemorrhoids: Secondary | ICD-10-CM | POA: Diagnosis not present

## 2022-11-04 DIAGNOSIS — K602 Anal fissure, unspecified: Secondary | ICD-10-CM | POA: Diagnosis not present

## 2022-11-04 DIAGNOSIS — K625 Hemorrhage of anus and rectum: Secondary | ICD-10-CM | POA: Diagnosis not present

## 2022-11-16 ENCOUNTER — Ambulatory Visit: Payer: BC Managed Care – PPO | Admitting: Dermatology

## 2022-11-22 ENCOUNTER — Other Ambulatory Visit: Payer: Self-pay | Admitting: Primary Care

## 2022-11-22 DIAGNOSIS — E7800 Pure hypercholesterolemia, unspecified: Secondary | ICD-10-CM

## 2022-11-24 ENCOUNTER — Other Ambulatory Visit: Payer: Self-pay | Admitting: Primary Care

## 2022-11-24 DIAGNOSIS — I10 Essential (primary) hypertension: Secondary | ICD-10-CM

## 2022-11-24 DIAGNOSIS — K219 Gastro-esophageal reflux disease without esophagitis: Secondary | ICD-10-CM

## 2022-11-26 DIAGNOSIS — F411 Generalized anxiety disorder: Secondary | ICD-10-CM

## 2022-11-26 DIAGNOSIS — S39012A Strain of muscle, fascia and tendon of lower back, initial encounter: Secondary | ICD-10-CM | POA: Diagnosis not present

## 2022-11-28 MED ORDER — SERTRALINE HCL 25 MG PO TABS
25.0000 mg | ORAL_TABLET | Freq: Every day | ORAL | 0 refills | Status: DC
Start: 2022-11-28 — End: 2023-09-20

## 2023-01-06 ENCOUNTER — Ambulatory Visit (HOSPITAL_BASED_OUTPATIENT_CLINIC_OR_DEPARTMENT_OTHER): Payer: BC Managed Care – PPO | Admitting: Cardiology

## 2023-01-11 ENCOUNTER — Encounter (HOSPITAL_BASED_OUTPATIENT_CLINIC_OR_DEPARTMENT_OTHER): Payer: Self-pay

## 2023-01-11 ENCOUNTER — Telehealth (HOSPITAL_BASED_OUTPATIENT_CLINIC_OR_DEPARTMENT_OTHER): Payer: Self-pay | Admitting: Cardiology

## 2023-01-11 NOTE — Telephone Encounter (Signed)
Left message for patient regarding new appointment date with Dr. Cristal Deer Friday 03/11/23 at 9:00am.  (Provider not in 03/04/23).  Will send My Chart message and requested return confirmation call

## 2023-01-25 ENCOUNTER — Telehealth: Payer: BC Managed Care – PPO | Admitting: Family Medicine

## 2023-01-25 DIAGNOSIS — B9689 Other specified bacterial agents as the cause of diseases classified elsewhere: Secondary | ICD-10-CM

## 2023-01-25 DIAGNOSIS — J019 Acute sinusitis, unspecified: Secondary | ICD-10-CM | POA: Diagnosis not present

## 2023-01-25 MED ORDER — AMOXICILLIN-POT CLAVULANATE 875-125 MG PO TABS
1.0000 | ORAL_TABLET | Freq: Two times a day (BID) | ORAL | 0 refills | Status: AC
Start: 2023-01-25 — End: 2023-02-01

## 2023-01-25 NOTE — Progress Notes (Signed)

## 2023-01-26 ENCOUNTER — Ambulatory Visit (HOSPITAL_BASED_OUTPATIENT_CLINIC_OR_DEPARTMENT_OTHER): Payer: BC Managed Care – PPO | Admitting: Obstetrics & Gynecology

## 2023-02-16 ENCOUNTER — Ambulatory Visit: Payer: BC Managed Care – PPO | Admitting: Primary Care

## 2023-02-16 ENCOUNTER — Encounter: Payer: Self-pay | Admitting: Primary Care

## 2023-02-16 VITALS — BP 126/74 | HR 80 | Temp 97.6°F | Ht 66.0 in | Wt 185.0 lb

## 2023-02-16 DIAGNOSIS — E1165 Type 2 diabetes mellitus with hyperglycemia: Secondary | ICD-10-CM | POA: Diagnosis not present

## 2023-02-16 DIAGNOSIS — R053 Chronic cough: Secondary | ICD-10-CM

## 2023-02-16 MED ORDER — DOXYCYCLINE HYCLATE 100 MG PO TABS
100.0000 mg | ORAL_TABLET | Freq: Two times a day (BID) | ORAL | 0 refills | Status: DC
Start: 2023-02-16 — End: 2023-06-20

## 2023-02-16 MED ORDER — OZEMPIC (0.25 OR 0.5 MG/DOSE) 2 MG/3ML ~~LOC~~ SOPN
PEN_INJECTOR | SUBCUTANEOUS | 0 refills | Status: DC
Start: 2023-02-16 — End: 2023-06-14

## 2023-02-16 NOTE — Assessment & Plan Note (Signed)
Discussed common causes for persistent cough. Less likely GERD given omeprazole management.  We did discuss to start an antihistamine daily. Given no improvement, coupled with presentation today, we will treat for atypical infection.  She has an allergy to macrolides so we will start with doxycycline 100 mg twice daily x 7 days.  Will defer chest x-ray today as lungs were clear.  She agrees. She will update if no improvement.

## 2023-02-16 NOTE — Patient Instructions (Signed)
Start Doxycycline antibiotic for the infection. Take 1 tablet by mouth twice daily for 7 days.  Start semaglutide (Ozempic) for diabetes/weight loss. Start by injecting 0.25 mg into the skin once weekly for 4 weeks, then increase to 0.5 mg once weekly thereafter.   It was a pleasure to see you today!

## 2023-02-16 NOTE — Progress Notes (Signed)
Subjective:    Patient ID: Michelle Figueroa, female    DOB: 1960/12/24, 62 y.o.   MRN: [id number]  Cough Associated symptoms include postnasal drip and a sore throat. Pertinent negatives include no chills or fever.    Michelle Figueroa is a very pleasant 62 y.o. female with a history of GERD, controlled type 2 diabetes, hyperlipidemia, hypertension who presents today to discuss cough. She would also like to discuss trying Ozempic for weight loss and diabetes.  1) Persistent Cough: Symptom onset 01/07/23 with  nasal congestion, post nasal drip, and cough.  At that time she had kept her grandchildren for about 7 days.  One of the children had similar symptoms.  Her cough and symptoms persisted so she was evaluated through an e-visit on 01/25/2023, diagnosed with acute bacterial sinusitis and treated with a 7-day course of Augmentin 875/125 mg twice daily. She felt no improvement after taking Augmentin.   Symptoms today include cough, chest congestion, fatigue, sore throat. Her cough is productive but is not sure of the color.  Overall she is not feeling well.  She denies fevers, body aches, chills.   She is compliant to omeprazole 20 mg daily for GERD. She's not been taking anything OTC for symptoms. She is a non smoker. No history of asthma, even as a child.   2) Type 2 Diabetes:  Current medications include: None  Last A1C: 5.8 in April 2024 Last Eye Exam: UTD Last Foot Exam: UTD Pneumonia Vaccination: 2020 Urine Microalbumin: UTD Statin: Pravastatin  Dietary changes since last visit: None.  She is requesting a prescription for Ozempic to assist with weight loss.  She has tried numerous avenues for weight loss and has been temporarily successful.    Review of Systems  Constitutional:  Positive for fatigue. Negative for chills and fever.  HENT:  Positive for congestion, postnasal drip and sore throat.   Respiratory:  Positive for cough.          Past Medical History:   Diagnosis Date   Diabetes mellitus without complication (HCC)    followed by PCP   Diverticulosis    noted on colonoscopy   Essential hypertension    GERD (gastroesophageal reflux disease)    Hemorrhoids    Insomnia     Social History   Socioeconomic History   Marital status: Married    Spouse name: Not on file   Number of children: Not on file   Years of education: Not on file   Highest education level: Not on file  Occupational History   Not on file  Tobacco Use   Smoking status: Never   Smokeless tobacco: Never  Vaping Use   Vaping status: Never Used  Substance and Sexual Activity   Alcohol use: Yes    Alcohol/week: 3.0 standard drinks of alcohol    Types: 3 Standard drinks or equivalent per week   Drug use: No   Sexual activity: Yes    Birth control/protection: Post-menopausal  Other Topics Concern   Not on file  Social History Narrative   Married.   2 children.   Works in [employer] at [employer].    Enjoys traveling, spending time with family.    Social Determinants of Health   Financial Resource Strain: Not on file  Food Insecurity: Not on file  Transportation Needs: Not on file  Physical Activity: Not on file  Stress: Not on file  Social Connections: Unknown (03/14/2022)   [facility] from [facility]  Social Network    Social Network: Not on file  Intimate Partner Violence: Unknown (03/14/2022)   Received from [facility]   HITS    Physically Hurt: Not on file    Insult or Talk Down To: Not on file    Threaten Physical Harm: Not on file    Scream or Curse: Not on file    Past Surgical History:  Procedure Laterality Date   APPENDECTOMY  2009   CERVICAL FUSION  2010   CESAREAN SECTION     CHOLECYSTECTOMY  2000   [facility] Suspension Arthroplasty Left 12/25/2020   left thumb with tight rope and tendon interposition   ENDOMETRIAL ABLATION     GYNECOLOGIC CRYOSURGERY     RETROPUBIC SLING  10/2009   Dr. Barnabas [facility]ister at [facility]    REVISION URINARY SLING  11/19/2009   with cysto due to urinary retension    Family History  Problem Relation Age of Onset   Arthritis Mother    Diabetes Mother    Colon cancer Mother 84   Lung cancer Father    Hypertension Father    Heart attack Father    Heart attack Brother 33   Breast cancer Neg Hx     Allergies  Allergen Reactions   Erythromycin Ethylsuccinate Rash   Oxycodone-Acetaminophen Nausea Only    Current Outpatient Medications on File Prior to Visit  Medication Sig Dispense Refill   estradiol (ESTRACE VAGINAL) 0.1 MG/GM vaginal cream 1 gram pv twice weekly 42.5 g 3   lisinopril (ZESTRIL) 30 MG tablet TAKE 1 TABLET BY MOUTH DAILY FOR BLOOD PRESSURE. 90 tablet 2   omeprazole (PRILOSEC) 20 MG capsule TAKE 1 CAPSULE BY MOUTH ONCE DAILY FOR HEARTBURN 90 capsule 2   pravastatin (PRAVACHOL) 40 MG tablet TAKE ONE TABLET BY MOUTH DAILY FOR CHOLESTEROL 90 tablet 2   sertraline (ZOLOFT) 25 MG tablet Take 1 tablet (25 mg total) by mouth daily. For anxiety 90 tablet 0   tretinoin (RETIN-A) 0.025 % cream Apply pea-sized amount to face at bedtime, wash off in morning. 20 g 3   No current facility-administered medications on file prior to visit.    BP 126/74   Pulse 80   Temp 97.6 F (36.4 C) (Temporal)   Ht 5\' 6"  (1.676 m)   Wt 185 lb (83.9 kg)   SpO2 97%   BMI 29.86 kg/m  Objective:   Physical Exam HENT:     Right Ear: Tympanic membrane and ear canal normal.     Left Ear: Tympanic membrane and ear canal normal.     Nose:     Right Sinus: No maxillary sinus tenderness or frontal sinus tenderness.     Left Sinus: No maxillary sinus tenderness or frontal sinus tenderness.     Mouth/Throat:     Pharynx: No posterior oropharyngeal erythema.  Eyes:     Conjunctiva/sclera: Conjunctivae normal.  Cardiovascular:     Rate and Rhythm: Normal rate and regular rhythm.  Pulmonary:     Effort: Pulmonary effort is normal.     Breath sounds: Normal breath sounds. No wheezing  or rales.     Comments: Congested cough noted several times during visit Musculoskeletal:     Cervical back: Neck supple.  Lymphadenopathy:     Cervical: Cervical adenopathy present.  Skin:    General: Skin is warm and dry.           Assessment & Plan:  Persistent cough for 3 weeks or longer Assessment & Plan:  Discussed common causes for persistent cough. Less likely GERD given omeprazole management.  We did discuss to start an antihistamine daily. Given no improvement, coupled with presentation today, we will treat for atypical infection.  She has an allergy to macrolides so we will start with doxycycline 100 mg twice daily x 7 days.  Will defer chest x-ray today as lungs were clear.  She agrees. She will update if no improvement.  Orders: -     Doxycycline Hyclate; Take 1 tablet (100 mg total) by mouth 2 (two) times daily.  Dispense: 14 tablet; Refill: 0  Controlled type 2 diabetes mellitus with hyperglycemia, without long-term current use of insulin (HCC) Assessment & Plan: Start semaglutide (Ozempic) for diabetes/weight loss. Start by injecting 0.25 mg into the skin once weekly for 4 weeks, then increase to 0.5 mg once weekly thereafter.   Discussed potential side effects and instructions for administration. Follow-up in late fall.  Orders: -     Ozempic (0.25 or 0.5 MG/DOSE); Inject 0.25 mg into the skin once weekly for 4 weeks, then increase to 0.5 mg once weekly thereafter for diabetes.  Dispense: 9 mL; Refill: 0        Doreene Nest, NP

## 2023-02-16 NOTE — Assessment & Plan Note (Signed)
Start semaglutide (Ozempic) for diabetes/weight loss. Start by injecting 0.25 mg into the skin once weekly for 4 weeks, then increase to 0.5 mg once weekly thereafter.   Discussed potential side effects and instructions for administration. Follow-up in late fall.

## 2023-02-18 ENCOUNTER — Telehealth: Payer: Self-pay

## 2023-02-18 NOTE — Telephone Encounter (Signed)
*  Primary  Pharmacy Patient Advocate Encounter   Received notification from CoverMyMeds that prior authorization for Ozempic (0.25 or 0.5 MG/DOSE) 2MG /3ML pen-injectors  is required/requested.   Insurance verification completed.   The patient is insured through [facility] .   Per test claim: PA required; PA submitted to [facility] via CoverMyMeds Key/confirmation #/EOC [id number] Status is pending

## 2023-02-25 NOTE — Telephone Encounter (Signed)
Pharmacy Patient Advocate Encounter  Received notification from [facility] that Prior Authorization for Ozempic has been APPROVED from 02/20/2023 to 02/19/2026

## 2023-03-01 DIAGNOSIS — R053 Chronic cough: Secondary | ICD-10-CM

## 2023-03-01 DIAGNOSIS — I10 Essential (primary) hypertension: Secondary | ICD-10-CM

## 2023-03-01 MED ORDER — VALSARTAN 80 MG PO TABS: 80.0000 mg | ORAL_TABLET | Freq: Every day | ORAL | 0 refills | Status: DC

## 2023-03-03 ENCOUNTER — Other Ambulatory Visit: Payer: Self-pay | Admitting: Primary Care

## 2023-03-03 DIAGNOSIS — F411 Generalized anxiety disorder: Secondary | ICD-10-CM

## 2023-03-04 ENCOUNTER — Ambulatory Visit (HOSPITAL_BASED_OUTPATIENT_CLINIC_OR_DEPARTMENT_OTHER): Payer: BC Managed Care – PPO | Admitting: Cardiology

## 2023-03-07 ENCOUNTER — Other Ambulatory Visit: Payer: Self-pay | Admitting: Radiology

## 2023-03-07 ENCOUNTER — Ambulatory Visit (INDEPENDENT_AMBULATORY_CARE_PROVIDER_SITE_OTHER)
Admission: RE | Admit: 2023-03-07 | Discharge: 2023-03-07 | Disposition: A | Discharge status: Home / Self Care | Payer: BC Managed Care – PPO | Source: Ambulatory Visit | Attending: Primary Care | Admitting: Primary Care

## 2023-03-07 DIAGNOSIS — R053 Chronic cough: Secondary | ICD-10-CM | POA: Diagnosis not present

## 2023-03-07 DIAGNOSIS — Z981 Arthrodesis status: Secondary | ICD-10-CM | POA: Diagnosis not present

## 2023-03-07 DIAGNOSIS — R0989 Other specified symptoms and signs involving the circulatory and respiratory systems: Secondary | ICD-10-CM | POA: Diagnosis not present

## 2023-03-11 ENCOUNTER — Ambulatory Visit (HOSPITAL_BASED_OUTPATIENT_CLINIC_OR_DEPARTMENT_OTHER): Payer: BC Managed Care – PPO | Admitting: Cardiology

## 2023-03-15 ENCOUNTER — Other Ambulatory Visit: Payer: Self-pay | Admitting: Primary Care

## 2023-03-15 DIAGNOSIS — Z1231 Encounter for screening mammogram for malignant neoplasm of breast: Secondary | ICD-10-CM

## 2023-03-24 ENCOUNTER — Ambulatory Visit
Admission: RE | Admit: 2023-03-24 | Discharge: 2023-03-24 | Disposition: A | Discharge status: Home / Self Care | Payer: BC Managed Care – PPO | Source: Ambulatory Visit | Attending: Primary Care | Admitting: Primary Care

## 2023-03-24 DIAGNOSIS — Z1231 Encounter for screening mammogram for malignant neoplasm of breast: Secondary | ICD-10-CM | POA: Diagnosis not present

## 2023-03-29 LAB — HM DIABETES EYE EXAM

## 2023-04-04 DIAGNOSIS — A6000 Herpesviral infection of urogenital system, unspecified: Secondary | ICD-10-CM

## 2023-04-04 MED ORDER — VALACYCLOVIR HCL 1 G PO TABS
1000.0000 mg | ORAL_TABLET | Freq: Every day | ORAL | 0 refills | Status: AC
Start: 2023-04-04 — End: 2023-04-09

## 2023-05-03 DIAGNOSIS — R110 Nausea: Secondary | ICD-10-CM

## 2023-05-04 MED ORDER — ONDANSETRON 4 MG PO TBDP
4.0000 mg | ORAL_TABLET | Freq: Three times a day (TID) | ORAL | 0 refills | Status: DC | PRN
Start: 2023-05-04 — End: 2023-11-08

## 2023-05-23 ENCOUNTER — Ambulatory Visit (HOSPITAL_BASED_OUTPATIENT_CLINIC_OR_DEPARTMENT_OTHER): Payer: BC Managed Care – PPO | Admitting: Family

## 2023-05-26 ENCOUNTER — Other Ambulatory Visit: Payer: Self-pay | Admitting: Primary Care

## 2023-05-26 DIAGNOSIS — E7800 Pure hypercholesterolemia, unspecified: Secondary | ICD-10-CM

## 2023-06-07 ENCOUNTER — Other Ambulatory Visit: Payer: Self-pay | Admitting: Primary Care

## 2023-06-07 DIAGNOSIS — I10 Essential (primary) hypertension: Secondary | ICD-10-CM

## 2023-06-07 DIAGNOSIS — K219 Gastro-esophageal reflux disease without esophagitis: Secondary | ICD-10-CM

## 2023-06-12 ENCOUNTER — Other Ambulatory Visit: Payer: Self-pay | Admitting: Primary Care

## 2023-06-12 DIAGNOSIS — E1165 Type 2 diabetes mellitus with hyperglycemia: Secondary | ICD-10-CM

## 2023-06-12 NOTE — Telephone Encounter (Signed)
Needs follow up visit since initiation of Ozempic.  How is she doing on the Ozempic? Is she on 0.5 mg?

## 2023-06-13 NOTE — Telephone Encounter (Signed)
Patient called in returning call she received. Relayed message below to patient. She stated that she is currently taking 0.5 MG of the Ozempic. She stated that she is doing great on it and has lost 14 lbs since starting it. She stated that she is willing to go up if Jae Dire would like her to. She is scheduled for a follow up visit this week. Thank you!

## 2023-06-13 NOTE — Telephone Encounter (Signed)
Unable to reach patient. Left voicemail to return call to our office.   

## 2023-06-14 NOTE — Telephone Encounter (Signed)
Noted, will discuss during upcoming visit. 

## 2023-06-16 ENCOUNTER — Ambulatory Visit (INDEPENDENT_AMBULATORY_CARE_PROVIDER_SITE_OTHER): Payer: BC Managed Care – PPO | Admitting: Dermatology

## 2023-06-16 ENCOUNTER — Encounter: Payer: Self-pay | Admitting: Dermatology

## 2023-06-16 ENCOUNTER — Ambulatory Visit: Payer: BC Managed Care – PPO | Admitting: Primary Care

## 2023-06-16 VITALS — BP 142/80 | HR 80 | Temp 97.3°F | Ht 66.0 in | Wt 177.0 lb

## 2023-06-16 DIAGNOSIS — Z79899 Other long term (current) drug therapy: Secondary | ICD-10-CM

## 2023-06-16 DIAGNOSIS — Z7985 Long-term (current) use of injectable non-insulin antidiabetic drugs: Secondary | ICD-10-CM

## 2023-06-16 DIAGNOSIS — L988 Other specified disorders of the skin and subcutaneous tissue: Secondary | ICD-10-CM | POA: Diagnosis not present

## 2023-06-16 DIAGNOSIS — D1801 Hemangioma of skin and subcutaneous tissue: Secondary | ICD-10-CM

## 2023-06-16 DIAGNOSIS — Z7189 Other specified counseling: Secondary | ICD-10-CM

## 2023-06-16 DIAGNOSIS — E1165 Type 2 diabetes mellitus with hyperglycemia: Secondary | ICD-10-CM

## 2023-06-16 DIAGNOSIS — W908XXA Exposure to other nonionizing radiation, initial encounter: Secondary | ICD-10-CM

## 2023-06-16 DIAGNOSIS — Z23 Encounter for immunization: Secondary | ICD-10-CM | POA: Diagnosis not present

## 2023-06-16 DIAGNOSIS — L578 Other skin changes due to chronic exposure to nonionizing radiation: Secondary | ICD-10-CM | POA: Diagnosis not present

## 2023-06-16 DIAGNOSIS — L909 Atrophic disorder of skin, unspecified: Secondary | ICD-10-CM | POA: Diagnosis not present

## 2023-06-16 DIAGNOSIS — D229 Melanocytic nevi, unspecified: Secondary | ICD-10-CM

## 2023-06-16 DIAGNOSIS — L821 Other seborrheic keratosis: Secondary | ICD-10-CM

## 2023-06-16 DIAGNOSIS — Z1283 Encounter for screening for malignant neoplasm of skin: Secondary | ICD-10-CM

## 2023-06-16 DIAGNOSIS — L814 Other melanin hyperpigmentation: Secondary | ICD-10-CM

## 2023-06-16 LAB — POCT GLYCOSYLATED HEMOGLOBIN (HGB A1C): Hemoglobin A1C: 5.1 % (ref 4.0–5.6)

## 2023-06-16 MED ORDER — SEMAGLUTIDE (1 MG/DOSE) 4 MG/3ML ~~LOC~~ SOPN: 1.0000 mg | PEN_INJECTOR | SUBCUTANEOUS | 0 refills | Status: DC

## 2023-06-16 MED ORDER — TRETINOIN 0.025 % EX CREA: TOPICAL_CREAM | Freq: Every day | CUTANEOUS | 2 refills | Status: AC

## 2023-06-16 NOTE — Assessment & Plan Note (Signed)
Improved and well controlled with A1C today of 5.1.   Increase Ozempic to 1 mg weekly for weight loss purposes. We discussed the symptoms and warning signs of hypoglycemia.  Follow up in 6 months.

## 2023-06-16 NOTE — Progress Notes (Signed)
   Follow-Up Visit   Subjective  Michelle Figueroa is a 62 y.o. female who presents for the following: Skin Cancer Screening and Full Body Skin Exam  The patient presents for Total-Body Skin Exam (TBSE) for skin cancer screening and mole check. The patient has spots, moles and lesions to be evaluated, some may be new or changing and the patient may have concern these could be cancer.    The following portions of the chart were reviewed this encounter and updated as appropriate: medications, allergies, medical history  Review of Systems:  No other skin or systemic complaints except as noted in HPI or Assessment and Plan.  Objective  Well appearing patient in no apparent distress; mood and affect are within normal limits.  A full examination was performed including scalp, head, eyes, ears, nose, lips, neck, chest, axillae, abdomen, back, buttocks, bilateral upper extremities, bilateral lower extremities, hands, feet, fingers, toes, fingernails, and toenails. All findings within normal limits unless otherwise noted below.   Relevant physical exam findings are noted in the Assessment and Plan.  face Rhytides and volume loss.     Assessment & Plan   SKIN CANCER SCREENING PERFORMED TODAY.  ACTINIC DAMAGE - Chronic condition, secondary to cumulative UV/sun exposure - diffuse scaly erythematous macules with underlying dyspigmentation - Recommend daily broad spectrum sunscreen SPF 30+ to sun-exposed areas, reapply every 2 hours as needed.  - Staying in the shade or wearing long sleeves, sun glasses (UVA+UVB protection) and wide brim hats (4-inch brim around the entire circumference of the hat) are also recommended for sun protection.  - Call for new or changing lesions.  LENTIGINES, SEBORRHEIC KERATOSES, HEMANGIOMAS - Benign normal skin lesions - Benign-appearing - Call for any changes  MELANOCYTIC NEVI - Tan-brown and/or pink-flesh-colored symmetric macules and papules - Benign  appearing on exam today - Observation - Call clinic for new or changing moles - Recommend daily use of broad spectrum spf 30+ sunscreen to sun-exposed areas.    Indentation - fat atrophy of skin Left Buttock No hx of injury or steroid injection that patient is aware of  Elastosis of skin face Start tretinoin 0.025% cream at bedtime  Topical retinoid medications like tretinoin/Retin-A, adapalene/Differin, tazarotene/Fabior, and Epiduo/Epiduo Forte can cause dryness and irritation when first started. Only apply a pea-sized amount to the entire affected area. Avoid applying it around the eyes, edges of mouth and creases at the nose. If you experience irritation, use a good moisturizer first and/or apply the medicine less often. If you are doing well with the medicine, you can increase how often you use it until you are applying every night. Be careful with sun protection while using this medication as it can make you sensitive to the sun. This medicine should not be used by pregnant women.    Related Medications tretinoin (RETIN-A) 0.025 % cream Apply topically at bedtime.   Return in about 1 year (around 06/15/2024) for TBSE, with Dr. Beverlyn Roux, RMA, am acting as scribe for Armida Sans, MD .   Documentation: I have reviewed the above documentation for accuracy and completeness, and I agree with the above.  Armida Sans, MD

## 2023-06-16 NOTE — Patient Instructions (Signed)

## 2023-06-16 NOTE — Progress Notes (Signed)
Subjective:    Patient ID: Michelle Figueroa, female    DOB: 13-Feb-1961, 62 y.o.   MRN: [id number]  HPI  Michelle Figueroa is a very pleasant 62 y.o. female with a history of hypertension, type 2 diabetes, hyperlipidemia, GAD who presents today for follow-up of diabetes and obesity.  She is also due for her second Shingrix vaccine.  Current medications include: Ozempic 0.5 mg weekly. She would like to increase her dose to 1 mg weekly for additional weight loss.   She is checking her blood glucose 0 times daily.  Last A1C: 5.8 in April 2024, 5.1 today  Last Eye Exam: UTD Last Foot Exam: Up-to-date Pneumonia Vaccination: 2020 Urine Microalbumin: Up-to-date Statin: Pravastatin  Dietary changes since last visit: Little appetite, could make better food choices, does tend to eat more take out food.   Exercise: Walking some  Wt Readings from Last 3 Encounters:  06/16/23 177 lb (80.3 kg)  02/16/23 185 lb (83.9 kg)  10/26/22 185 lb (83.9 kg)   BP Readings from Last 3 Encounters:  06/16/23 (!) 142/80  02/16/23 126/74  10/26/22 122/84      Review of Systems  Respiratory:  Negative for shortness of breath.   Cardiovascular:  Negative for chest pain.  Gastrointestinal:  Positive for nausea. Negative for constipation and diarrhea.  Neurological:  Negative for numbness.         Past Medical History:  Diagnosis Date   Diabetes mellitus without complication (HCC)    followed by PCP   Diverticulosis    noted on colonoscopy   Essential hypertension    GERD (gastroesophageal reflux disease)    Hemorrhoids    Insomnia     Social History   Socioeconomic History   Marital status: Married    Spouse name: Not on file   Number of children: Not on file   Years of education: Not on file   Highest education level: Not on file  Occupational History   Not on file  Tobacco Use   Smoking status: Never   Smokeless tobacco: Never  Vaping Use   Vaping status: Never Used   Substance and Sexual Activity   Alcohol use: Yes    Alcohol/week: 3.0 standard drinks of alcohol    Types: 3 Standard drinks or equivalent per week   Drug use: No   Sexual activity: Yes    Birth control/protection: Post-menopausal  Other Topics Concern   Not on file  Social History Narrative   Married.   2 children.   Works in [employer] at [employer].    Enjoys traveling, spending time with family.    Social Determinants of Health   Financial Resource Strain: Not on file  Food Insecurity: Not on file  Transportation Needs: Not on file  Physical Activity: Not on file  Stress: Not on file  Social Connections: Unknown (03/14/2022)   Received [facility]   Social Network    Social Network: Not on file  Intimate Partner Violence: Unknown (03/14/2022)   Received from [facility], [facility]   HITS    Physically Hurt: Not on file    Insult or Talk Down To: Not on file    Threaten Physical Harm: Not on file    Scream or Curse: Not on file    Past Surgical History:  Procedure Laterality Date   APPENDECTOMY  2009   CERVICAL FUSION  2010   CESAREAN SECTION     CHOLECYSTECTOMY  2000  [facility] Suspension Arthroplasty Left 12/25/2020   left thumb with tight rope and tendon interposition   ENDOMETRIAL ABLATION     GYNECOLOGIC CRYOSURGERY     RETROPUBIC SLING  10/2009   Dr. Barnabas Lister at [facility]   REVISION URINARY SLING  11/19/2009   with cysto due to urinary retension    Family History  Problem Relation Age of Onset   Arthritis Mother    Diabetes Mother    Colon cancer Mother 18   Lung cancer Father    Hypertension Father    Heart attack Father    Heart attack Brother 30   Breast cancer Neg Hx     Allergies  Allergen Reactions   Erythromycin Ethylsuccinate Rash   Oxycodone-Acetaminophen Nausea Only    Current Outpatient Medications on File Prior to Visit  Medication Sig Dispense Refill   estradiol (ESTRACE VAGINAL) 0.1  MG/GM vaginal cream 1 gram pv twice weekly 42.5 g 3   omeprazole (PRILOSEC) 20 MG capsule TAKE 1 CAPSULE BY MOUTH ONCE DAILY FOR HEARTBURN 90 capsule 2   pravastatin (PRAVACHOL) 40 MG tablet TAKE ONE TABLET BY MOUTH DAILY FOR CHOLESTEROL 90 tablet 2   sertraline (ZOLOFT) 25 MG tablet Take 1 tablet (25 mg total) by mouth daily. For anxiety 90 tablet 0   tretinoin (RETIN-A) 0.025 % cream Apply topically at bedtime. 45 g 2   valsartan (DIOVAN) 80 MG tablet TAKE ONE TABLET EVERY DAY FOR BLOOD PRESSURE 90 tablet 0   doxycycline (VIBRA-TABS) 100 MG tablet Take 1 tablet (100 mg total) by mouth 2 (two) times daily. 14 tablet 0   ondansetron (ZOFRAN-ODT) 4 MG disintegrating tablet Take 1 tablet (4 mg total) by mouth every 8 (eight) hours as needed for nausea or vomiting. (Patient not taking: Reported on 06/16/2023) 15 tablet 0   tretinoin (RETIN-A) 0.025 % cream Apply pea-sized amount to face at bedtime, wash off in morning. 20 g 3   No current facility-administered medications on file prior to visit.    BP (!) 142/80   Pulse 80   Temp (!) 97.3 F (36.3 C) (Temporal)   Ht 5\' 6"  (1.676 m)   Wt 177 lb (80.3 kg)   SpO2 98%   BMI 28.57 kg/m  Objective:   Physical Exam Cardiovascular:     Rate and Rhythm: Normal rate and regular rhythm.  Pulmonary:     Effort: Pulmonary effort is normal.     Breath sounds: Normal breath sounds.  Musculoskeletal:     Cervical back: Neck supple.  Skin:    General: Skin is warm and dry.  Neurological:     Mental Status: She is alert and oriented to person, place, and time.  Psychiatric:        Mood and Affect: Mood normal.           Assessment & Plan:  Controlled type 2 diabetes mellitus with hyperglycemia, without long-term current use of insulin (HCC) Assessment & Plan: Improved and well controlled with A1C today of 5.1.   Increase Ozempic to 1 mg weekly for weight loss purposes. We discussed the symptoms and warning signs of hypoglycemia.  Follow  up in 6 months.  Orders: -     POCT glycosylated hemoglobin (Hb A1C) -     Semaglutide (1 MG/DOSE); Inject 1 mg as directed once a week. for diabetes.  Dispense: 9 mL; Refill: 0  Encounter for immunization -     Varicella-zoster vaccine IM        Natalia Leatherwood  Allayne Gitelman, NP

## 2023-06-16 NOTE — Patient Instructions (Signed)
We increased your dose of Ozempic to 1 mg weekly.  Please schedule a physical to meet with me in 6 months.   It was a pleasure to see you today!

## 2023-06-20 ENCOUNTER — Encounter (HOSPITAL_BASED_OUTPATIENT_CLINIC_OR_DEPARTMENT_OTHER): Payer: Self-pay | Admitting: Obstetrics & Gynecology

## 2023-06-20 ENCOUNTER — Ambulatory Visit (HOSPITAL_BASED_OUTPATIENT_CLINIC_OR_DEPARTMENT_OTHER): Payer: BC Managed Care – PPO | Admitting: Obstetrics & Gynecology

## 2023-06-20 VITALS — BP 160/94 | HR 85 | Ht 65.0 in | Wt 175.2 lb

## 2023-06-20 DIAGNOSIS — I10 Essential (primary) hypertension: Secondary | ICD-10-CM | POA: Diagnosis not present

## 2023-06-20 DIAGNOSIS — R6882 Decreased libido: Secondary | ICD-10-CM

## 2023-06-20 DIAGNOSIS — Z01419 Encounter for gynecological examination (general) (routine) without abnormal findings: Secondary | ICD-10-CM | POA: Diagnosis not present

## 2023-06-20 DIAGNOSIS — N952 Postmenopausal atrophic vaginitis: Secondary | ICD-10-CM | POA: Diagnosis not present

## 2023-06-20 MED ORDER — ESTRADIOL 0.1 MG/GM VA CREA: TOPICAL_CREAM | VAGINAL | 3 refills | Status: DC

## 2023-06-20 NOTE — Progress Notes (Signed)
62 y.o. G3P2 Married White or Caucasian female here for annual exam.  She had some issues with traffic today.  Her mother is in hospice just recently.  There is a lot going on with her.  Blood pressure elevated today.  She reports recent adjustment with her anti-hypertensive.  She does have a cuff at home and will start checking it.    Having decreased libido issues.  Does not really care about this at all but would like to have more interest for her husband/marriage.  Does have vaginal dryness. Not really using estrogen cream but does want a refill.    Has been on ozympic since September.  Hba1c was 5.1 with last testing that was one recently.    No LMP recorded. Patient is postmenopausal.    Health Maintenance: Pap:  01/2022 History of abnormal Pap:  remote hx MMG:  03/25/2023 Colonoscopy:  03/12/2022, follow up 5 years BMD:   guidelines reviewed Screening Labs: 10/2022   reports that she has never smoked. She has never used smokeless tobacco. She reports current alcohol use of about 3.0 standard drinks of alcohol per week. She reports that she does not use drugs.  Past Medical History:  Diagnosis Date   Diabetes mellitus without complication (HCC)    followed by PCP   Diverticulosis    noted on colonoscopy   Essential hypertension    GERD (gastroesophageal reflux disease)    Hemorrhoids    Insomnia     Past Surgical History:  Procedure Laterality Date   APPENDECTOMY  2009   CERVICAL FUSION  2010   CESAREAN SECTION     CHOLECYSTECTOMY  2000   [facility] Suspension Arthroplasty Left 12/25/2020   left thumb with tight rope and tendon interposition   ENDOMETRIAL ABLATION     GYNECOLOGIC CRYOSURGERY     RETROPUBIC SLING  10/2009   [facility]ab[facility]ister at [facility]   REVISION URINARY SLING  11/19/2009   with cysto due to urinary retension    Current Outpatient Medications  Medication Sig Dispense Refill   estradiol (ESTRACE VAGINAL) 0.1 MG/GM vaginal cream 1 gram pv twice weekly  42.5 g 3   omeprazole (PRILOSEC) 20 MG capsule TAKE 1 CAPSULE BY MOUTH ONCE DAILY FOR HEARTBURN 90 capsule 2   pravastatin (PRAVACHOL) 40 MG tablet TAKE ONE TABLET BY MOUTH DAILY FOR CHOLESTEROL 90 tablet 2   Semaglutide, 1 MG/DOSE, 4 MG/3ML SOPN Inject 1 mg as directed once a week. for diabetes. 9 mL 0   sertraline (ZOLOFT) 25 MG tablet Take 1 tablet (25 mg total) by mouth daily. For anxiety 90 tablet 0   tretinoin (RETIN-A) 0.025 % cream Apply pea-sized amount to face at bedtime, wash off in morning. 20 g 3   tretinoin (RETIN-A) 0.025 % cream Apply topically at bedtime. 45 g 2   valsartan (DIOVAN) 80 MG tablet TAKE ONE TABLET EVERY DAY FOR BLOOD PRESSURE 90 tablet 0   ondansetron (ZOFRAN-ODT) 4 MG disintegrating tablet Take 1 tablet (4 mg total) by mouth every 8 (eight) hours as needed for nausea or vomiting. (Patient not taking: Reported on 06/16/2023) 15 tablet 0   No current facility-administered medications for this visit.    Family History  Problem Relation Age of Onset   Arthritis Mother    Diabetes Mother    Colon cancer Mother 17   Lung cancer Father    Hypertension Father    Heart attack Father    Heart attack Brother 70   Breast cancer Neg  Hx     ROS: Constitutional: negative Genitourinary:negative  Exam:   BP (!) 153/89 (BP Location: Right Arm, Patient Position: Sitting, Cuff Size: Large)   Pulse 85   Ht 5\' 5"  (1.651 m)   Wt 175 lb 3.2 oz (79.5 kg)   BMI 29.15 kg/m   Height: 5\' 5"  (165.1 cm)  General appearance: alert, cooperative and appears stated age Head: Normocephalic, without obvious abnormality, atraumatic Neck: no adenopathy, supple, symmetrical, trachea midline and thyroid normal to inspection and palpation Lungs: clear to auscultation bilaterally Breasts: normal appearance, no masses or tenderness Heart: regular rate and rhythm Abdomen: soft, non-tender; bowel sounds normal; no masses,  no organomegaly Extremities: extremities normal, atraumatic, no  cyanosis or edema Skin: Skin color, texture, turgor normal. No rashes or lesions Lymph nodes: Cervical, supraclavicular, and axillary nodes normal. No abnormal inguinal nodes palpated Neurologic: Grossly normal   Pelvic: External genitalia:  no lesions              Urethra:  normal appearing urethra with no masses, tenderness or lesions              Bartholins and Skenes: normal                 Vagina: atrophic vagina mucosa, no discharge, no lesions              Cervix: no lesions              Pap taken: No. Bimanual Exam:  Uterus:  normal size, contour, position, consistency, mobility, non-tender              Adnexa: normal adnexa and no mass, fullness, tenderness               Rectovaginal: Confirms               Anus:  normal sphincter tone, no lesions  Chaperone, Ina Homes, CMA, was present for exam.  Assessment/Plan: 1. Well woman exam with routine gynecological exam - Pap smear neg with neg HR HPV 2023 - Mammogram up ot date - Colonoscopy 2023, follow up 5 years - Bone mineral density will be done by age 62 - lab work done with PCP - vaccines reviewed/updated  2. Vaginal atrophy - encouraged pt to use more regularly - estradiol (ESTRACE VAGINAL) 0.1 MG/GM vaginal cream; 1 gram pv twice weekly  Dispense: 42.5 g; Refill: 3  3. Decreased libido - will chest testosterone level.  If low, will add topical T. - Testosterone, Total, LC/MS/MS  4. Essential hypertension - she is going to monitor blood pressures at home and then reach out if still elevated

## 2023-06-24 LAB — TESTOSTERONE, TOTAL, LC/MS/MS: Testosterone, total: 14 ng/dL (ref 7.0–40.0)

## 2023-06-27 ENCOUNTER — Other Ambulatory Visit (HOSPITAL_BASED_OUTPATIENT_CLINIC_OR_DEPARTMENT_OTHER): Payer: Self-pay | Admitting: Obstetrics & Gynecology

## 2023-06-27 NOTE — Addendum Note (Signed)
Addended by: Jerene Bears on: 06/27/2023 05:22 AM   Modules accepted: Orders

## 2023-06-28 ENCOUNTER — Telehealth (HOSPITAL_BASED_OUTPATIENT_CLINIC_OR_DEPARTMENT_OTHER): Payer: Self-pay

## 2023-06-28 NOTE — Telephone Encounter (Signed)
Pt was called and notified of lab result. Pt would like to proceed with the topical testosterone and I called [facility] they can make this compound this is where the patient would like for it to be sent too.   Thanks

## 2023-07-04 ENCOUNTER — Encounter: Payer: Self-pay | Admitting: Primary Care

## 2023-07-04 NOTE — Telephone Encounter (Signed)
 Care team updated and letter sent for eye exam notes.

## 2023-07-11 ENCOUNTER — Other Ambulatory Visit (HOSPITAL_BASED_OUTPATIENT_CLINIC_OR_DEPARTMENT_OTHER): Payer: Self-pay | Admitting: Obstetrics & Gynecology

## 2023-07-11 DIAGNOSIS — R748 Abnormal levels of other serum enzymes: Secondary | ICD-10-CM

## 2023-07-11 MED ORDER — NONFORMULARY OR COMPOUNDED ITEM: 1 refills | Status: DC

## 2023-07-12 NOTE — Telephone Encounter (Signed)
Rx Faxed. Spoke to [place] for the fax number. [email or web address]

## 2023-08-02 ENCOUNTER — Ambulatory Visit (HOSPITAL_BASED_OUTPATIENT_CLINIC_OR_DEPARTMENT_OTHER): Payer: BC Managed Care – PPO | Admitting: Family

## 2023-08-23 DIAGNOSIS — Z6828 Body mass index (BMI) 28.0-28.9, adult: Secondary | ICD-10-CM | POA: Diagnosis not present

## 2023-08-23 DIAGNOSIS — M5412 Radiculopathy, cervical region: Secondary | ICD-10-CM | POA: Diagnosis not present

## 2023-08-23 DIAGNOSIS — M542 Cervicalgia: Secondary | ICD-10-CM | POA: Diagnosis not present

## 2023-08-26 ENCOUNTER — Other Ambulatory Visit: Payer: Self-pay | Admitting: Primary Care

## 2023-08-26 DIAGNOSIS — E7800 Pure hypercholesterolemia, unspecified: Secondary | ICD-10-CM

## 2023-08-26 NOTE — Telephone Encounter (Signed)
Patient is due for CPE/follow up in late April, this will be required prior to any further refills.  Please schedule, thank you!

## 2023-08-29 NOTE — Telephone Encounter (Signed)
 lvm for pt to call office to schedule appt.

## 2023-09-05 ENCOUNTER — Encounter (HOSPITAL_BASED_OUTPATIENT_CLINIC_OR_DEPARTMENT_OTHER): Payer: Self-pay | Admitting: Family

## 2023-09-05 ENCOUNTER — Ambulatory Visit (HOSPITAL_BASED_OUTPATIENT_CLINIC_OR_DEPARTMENT_OTHER): Payer: BC Managed Care – PPO | Admitting: Family

## 2023-09-05 VITALS — BP 124/82 | HR 102 | Ht 66.0 in | Wt 171.4 lb

## 2023-09-05 DIAGNOSIS — E785 Hyperlipidemia, unspecified: Secondary | ICD-10-CM

## 2023-09-05 DIAGNOSIS — E119 Type 2 diabetes mellitus without complications: Secondary | ICD-10-CM

## 2023-09-05 DIAGNOSIS — I10 Essential (primary) hypertension: Secondary | ICD-10-CM

## 2023-09-05 DIAGNOSIS — I25118 Atherosclerotic heart disease of native coronary artery with other forms of angina pectoris: Secondary | ICD-10-CM

## 2023-09-05 NOTE — Patient Instructions (Signed)
 Medication Instructions:  Your physician recommends that you continue on your current medications as directed. Please refer to the Current Medication list given to you today.  Follow-Up: At [facility], you and your health needs are our priority.  As part of our continuing mission to provide you with exceptional heart care, we have created designated Provider Care Teams.  These Care Teams include your primary Cardiologist (physician) and Advanced Practice Providers (APPs -  Physician Assistants and Nurse Practitioners) who all work together to provide you with the care you need, when you need it.  We recommend signing up for the patient portal called "MyChart".  Sign up information is provided on this After Visit Summary.  MyChart is used to connect with patients for Virtual Visits (Telemedicine).  Patients are able to view lab/test results, encounter notes, upcoming appointments, etc.  Non-urgent messages can be sent to your provider as well.   To learn more about what you can do with MyChart, go to [email or web address].    Your next appointment:   1 year follow up with Dr. Cristal Deer, Gillian Shields, NP or Eligha Bridegroom, NP   Other Instructions To prevent palpitations: Make sure you are adequately hydrated.  Avoid and/or limit caffeine containing beverages like soda or tea. Exercise regularly.  Manage stress well. Some over the counter medications can cause palpitations such as Benadryl, AdvilPM, TylenolPM. Regular Advil or Tylenol do not cause palpitations.

## 2023-09-05 NOTE — Progress Notes (Signed)
 Cardiology Office Note:  .   Date:  09/05/2023  ID:  [name], DOB [birth year], MRN [id number] PCP: Doreene Nest, NP  [facility] Providers Cardiologist:  Jodelle Red, MD    History of Present Illness: .   Michelle Figueroa is a 63 y.o. female with history of nonobstructive coronary artery disease, hyperlipidemia, hypertension, DM2, obesity.  Calcium score 04/2019 of 21 placing her in the 84th percentile.  Brother and father both had MIs in their 41s.  Presents today for follow-up.  Notes her mother passed away 3 weeks ago and offered condolences.  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  No chest pain, exertional dyspnea, edema, orthopnea, PND.  Does note about twice per week she will wake at night and noticed her heart racing.  This self resolves.  No daytime palpitations.  ROS: Please see the history of present illness.    All other systems reviewed and are negative.   Studies Reviewed: Marland Kitchen   EKG Interpretation Date/Time:  Monday September 05 2023 10:41:27 EST Ventricular Rate:  93 PR Interval:  166 QRS Duration:  80 QT Interval:  366 QTC Calculation: 455 R Axis:   45  Text Interpretation: Normal sinus rhythm Normal ECG Confirmed by Gillian Shields ([id number]) on 09/05/2023 10:43:14 AM    Cardiac Studies & Procedures   ______________________________________________________________________________________________   STRESS TESTS  NM MYOCAR MULTI W/SPECT W 10/28/2016  Narrative  Blood pressure demonstrated a normal response to exercise.  There was no ST segment deviation noted during stress.  No T wave inversion was noted during stress.  The study is normal.  This is a low risk study.  The left ventricular ejection fraction is normal (55-65%).   ECHOCARDIOGRAM  ECHOCARDIOGRAM COMPLETE 11/08/2016  Narrative *[facility]* [address] [place], [place]  [address] [phone]  ------------------------------------------------------------------- Transthoracic Echocardiography  Patient:    Michelle Figueroa, Michelle Figueroa MR #:       [id number] Study Date: 11/08/2016 Gender:     F Age:        55 Height:     167.6 cm Weight:     89.4 kg BSA:        2.07 m^2 Pt. Status: Room:  ATTENDING    Default, Provider [phone] SONOGRAPHER  [facility] PERFORMING   [facility] [place] ORDERING     Almond Lint, MD REFERRING    Almond Lint, MD  cc:  ------------------------------------------------------------------- LV EF: 60% -   65%  ------------------------------------------------------------------- History:   PMH:   Chest pain.  Dyspnea.  Risk factors: Hypertension.  ------------------------------------------------------------------- Study Conclusions  - Left ventricle: The cavity size was normal. There was mild concentric hypertrophy. Systolic function was normal. The estimated ejection fraction was in the range of 60% to 65%. Wall motion was normal; there were no regional wall motion abnormalities. Left ventricular diastolic function parameters were normal. - Left atrium: The atrium was normal in size. - Right ventricle: Systolic function was normal. - Pulmonary arteries: Systolic pressure was within the normal range.  ------------------------------------------------------------------- Study data:  No prior study was available for comparison.  Study status:  Routine.  Procedure:  The patient reported no pain pre or post test. Transthoracic echocardiography. Image quality was adequate.  Study completion:  There were no complications. Transthoracic echocardiography.  M-mode, complete 2D, spectral Doppler, and color Doppler.  Birthdate:  Patient birthdate: [birth year].  Age:  Patient is 63 yr old.  Sex:  Gender: female. BMI: 31.8 kg/m^2.  Blood pressure:     118/80  Patient status: Outpatient.  Study date:  Study date: 11/08/2016. Study time:  02:09 PM.  -------------------------------------------------------------------  ------------------------------------------------------------------- Left ventricle:  The cavity size was normal. There was mild concentric hypertrophy. Systolic function was normal. The estimated ejection fraction was in the range of 60% to 65%. Wall motion was normal; there were no regional wall motion abnormalities. The transmitral flow pattern was normal. The deceleration time of the early transmitral flow velocity was normal. The pulmonary vein flow pattern was normal. The tissue Doppler parameters were normal. Left ventricular diastolic function parameters were normal.  ------------------------------------------------------------------- Aortic valve:  Poorly visualized.  Trileaflet; normal thickness leaflets. Mobility was not restricted.  Doppler:  Transvalvular velocity was within the normal range. There was no stenosis. There was no regurgitation.  ------------------------------------------------------------------- Aorta:  Aortic root: The aortic root was normal in size.  ------------------------------------------------------------------- Mitral valve:   Structurally normal valve.   Mobility was not restricted.  Doppler:  Transvalvular velocity was within the normal range. There was no evidence for stenosis. There was trivial regurgitation.    Peak gradient (D): 3 mm Hg.  ------------------------------------------------------------------- Left atrium:  The atrium was normal in size.  ------------------------------------------------------------------- Right ventricle:  The cavity size was normal. Wall thickness was normal. Systolic function was normal.  ------------------------------------------------------------------- Pulmonic valve:    Structurally normal valve.   Cusp separation was normal.  Doppler:  Transvalvular velocity was within the normal range. There was no evidence for stenosis.  There was no regurgitation.  ------------------------------------------------------------------- Tricuspid valve:   Structurally normal valve.    Doppler: Transvalvular velocity was within the normal range. There was no regurgitation.  ------------------------------------------------------------------- Pulmonary artery:   The main pulmonary artery was normal-sized. Systolic pressure was within the normal range.  ------------------------------------------------------------------- Right atrium:  The atrium was normal in size.  ------------------------------------------------------------------- Pericardium:  There was no pericardial effusion.  ------------------------------------------------------------------- Systemic veins: Inferior vena cava: The vessel was normal in size.  ------------------------------------------------------------------- Measurements  Left ventricle                             Value        Reference LV ID, ED, PLAX chordal            (L)     41    mm     43 - 52 LV ID, ES, PLAX chordal                    24    mm     23 - 38 LV fx shortening, PLAX chordal             41    %      >=29 LV PW thickness, ED                        12    mm     ---------- IVS/LV PW ratio, ED                        0.92         <=1.3 Stroke volume, 2D                          64    ml     ---------- Stroke volume/bsa, 2D  31    ml/m^2 ---------- LV ejection fraction, 1-p A4C              74    %      ---------- LV end-diastolic volume, 2-p               51    ml     ---------- LV end-systolic volume, 2-p                17    ml     ---------- LV ejection fraction, 2-p                  67    %      ---------- Stroke volume, 2-p                         34    ml     ---------- LV end-diastolic volume/bsa, 2-p           25    ml/m^2 ---------- LV end-systolic volume/bsa, 2-p            8     ml/m^2 ---------- Stroke volume/bsa, 2-p                     16.6  ml/m^2  ---------- LV e&', lateral                             10.3  cm/s   ---------- LV E/e&', lateral                           8.01         ---------- LV e&', medial                              6.85  cm/s   ---------- LV E/e&', medial                            12.04        ---------- LV e&', average                             8.58  cm/s   ---------- LV E/e&', average                           9.62         ----------  Ventricular septum                         Value        Reference IVS thickness, ED                          11    mm     ----------  LVOT                                       Value        Reference LVOT ID, S  18    mm     ---------- LVOT area                                  2.54  cm^2   ---------- LVOT mean velocity, S                      97.9  cm/s   ---------- LVOT VTI, S                                25.1  cm     ----------  Aorta                                      Value        Reference Aortic root ID, ED                         31    mm     ----------  Left atrium                                Value        Reference LA ID, A-P, ES                             32    mm     ---------- LA ID/bsa, A-P                             1.55  cm/m^2 <=2.2 LA volume, S                               41.6  ml     ---------- LA volume/bsa, S                           20.1  ml/m^2 ---------- LA volume, ES, 1-p A4C                     49.8  ml     ---------- LA volume/bsa, ES, 1-p A4C                 24.1  ml/m^2 ---------- LA volume, ES, 1-p A2C                     34.8  ml     ---------- LA volume/bsa, ES, 1-p A2C                 16.8  ml/m^2 ----------  Mitral valve                               Value        Reference Mitral E-wave peak velocity                82.5  cm/s   ---------- Mitral A-wave peak velocity  88.3  cm/s   ---------- Mitral deceleration time           (H)     275   ms     150 - 230 Mitral peak gradient, D                     3     mm Hg  ---------- Mitral E/A ratio, peak                     0.9          ---------- Mitral maximal regurg velocity,            271   cm/s   ---------- PISA  Pulmonary arteries                         Value        Reference PA pressure, S, DP                         9     mm Hg  <=30  Tricuspid valve                            Value        Reference Tricuspid regurg peak velocity             123   cm/s   ---------- Tricuspid peak RV-RA gradient              6     mm Hg  ----------  Right atrium                               Value        Reference RA ID, S-I, ES, A4C                        44.1  mm     34 - 49 RA area, ES, A4C                           14    cm^2   8.3 - 19.5 RA volume, ES, A/L                         35.6  ml     ---------- RA volume/bsa, ES, A/L                     17.2  ml/m^2 ----------  Systemic veins                             Value        Reference Estimated CVP                              3     mm Hg  ----------  Right ventricle                            Value        Reference TAPSE  19.1  mm     ---------- RV pressure, S, DP                         9     mm Hg  <=30 RV s&', lateral, S                          10.4  cm/s   ----------  Legend: (L)  and  (H)  mark values outside specified reference range.  ------------------------------------------------------------------- Prepared and Electronically Authenticated by  Dossie Arbour, MD, [facility] 2018-04-30T18:33:42      CT SCANS  CT CARDIAC SCORING (SELF PAY ONLY) 04/20/2019  Addendum 04/23/2019  4:33 PM ADDENDUM REPORT: 04/23/2019 16:31  CLINICAL DATA:  Risk stratification  EXAM: Coronary Calcium Score  TECHNIQUE: The patient was scanned on a [employer] scanner. Axial non-contrast 3 mm slices were carried out through the heart. The data set was analyzed on a dedicated work station and scored using the Agatson  method.  FINDINGS: Non-cardiac: See separate report from [facility].  Ascending Aorta: Normal size, measuring 32 mm at the mid ascending aorta, measured double oblique at PA bifurcation. No significant calcification.  Pericardium: Normal  Coronary arteries: Arise from normal coronary cusps.  Left dominant  IMPRESSION: Coronary calcium score of 21. This was 84th percentile for age and sex matched control.  Bridgette Christopher   Electronically Signed By: Jodelle Red M.D. On: 04/23/2019 16:31  Narrative EXAM: OVER-READ INTERPRETATION  CT CHEST  The following report is an over-read performed by radiologist Dr. Genevive Bi of [facility] on 04/20/2019. This over-read does not include interpretation of cardiac or coronary anatomy or pathology. The coronary calcium score interpretation by the cardiologist is attached.  COMPARISON:  None.  FINDINGS: Limited view of the lung parenchyma demonstrates no suspicious nodularity. Airways are normal.  Limited view of the mediastinum demonstrates no adenopathy. Esophagus normal.  Limited view of the upper abdomen unremarkable.  Limited view of the skeleton and chest wall is unremarkable.  IMPRESSION: No significant extracardiac findings.  Electronically Signed: By: Genevive Bi M.D. On: 04/20/2019 15:41     ______________________________________________________________________________________________      Risk Assessment/Calculations:             Physical Exam:   VS:  BP 124/82   Pulse (!) 102   Ht 5\' 6"  (1.676 m)   Wt 171 lb 6.4 oz (77.7 kg)   SpO2 97%   BMI 27.66 kg/m    Wt Readings from Last 3 Encounters:  09/05/23 171 lb 6.4 oz (77.7 kg)  06/20/23 175 lb 3.2 oz (79.5 kg)  06/16/23 177 lb (80.3 kg)    GEN: Well nourished, well developed in no acute distress NECK: No JVD; No carotid bruits CARDIAC: RRR, no murmurs, rubs, gallops RESPIRATORY:  Clear to  auscultation without rales, wheezing or rhonchi  ABDOMEN: Soft, non-tender, non-distended EXTREMITIES:  No edema; No deformity   ASSESSMENT AND PLAN: .    Nonobstructive CAD/HLD, LDL goal less than 70- Stable with no anginal symptoms. No indication for ischemic evaluation.  GDMT Pravastatin 40mg  daily. Recommend aiming for 150 minutes of moderate intensity activity per week and following a heart healthy diet.    Palpitations-palpitations about twice a week that wake her from sleep and self resolved.  Likely etiology SVT.  EKG today NSR with no acute ST/T wave changes.  As symptoms are infrequent, will defer ZIO  monitor.  We discussed possible as needed beta-blocker but is infrequent and overall not bothersome she prefers to monitor.  Encouraged to stay well-hydrated, avoid caffeine, limit alcohol, manage stress well.  Grief -offered condolences on the loss of her mother 3 weeks ago.  She does have a good support system.    DM2 / Obesity -successful 10 pound weight loss over the last year with Ozempic.  Managed by PCP.       Dispo: follow up in 1 year  Signed, Alver Sorrow, NP

## 2023-09-07 ENCOUNTER — Other Ambulatory Visit: Payer: Self-pay | Admitting: Primary Care

## 2023-09-07 DIAGNOSIS — I10 Essential (primary) hypertension: Secondary | ICD-10-CM

## 2023-09-09 ENCOUNTER — Other Ambulatory Visit: Payer: Self-pay | Admitting: Primary Care

## 2023-09-09 DIAGNOSIS — E1165 Type 2 diabetes mellitus with hyperglycemia: Secondary | ICD-10-CM

## 2023-09-13 ENCOUNTER — Telehealth: Payer: Self-pay

## 2023-09-13 NOTE — Telephone Encounter (Signed)
 Patient left voicemail and stated she would like to get scheduled for an IPL with you. I know this is now considered more of our BBL laser. OK to schedule?

## 2023-09-14 NOTE — Telephone Encounter (Signed)
 Patient advised and scheduled in October.  She was advised of treatment cost and no hx of fever blisters. aw

## 2023-09-19 ENCOUNTER — Other Ambulatory Visit: Payer: Self-pay | Admitting: Primary Care

## 2023-09-19 DIAGNOSIS — K219 Gastro-esophageal reflux disease without esophagitis: Secondary | ICD-10-CM

## 2023-09-19 DIAGNOSIS — F411 Generalized anxiety disorder: Secondary | ICD-10-CM

## 2023-09-19 NOTE — Telephone Encounter (Signed)
 Unable to reach patient. Left voicemail to return call to our office.

## 2023-09-19 NOTE — Telephone Encounter (Signed)
 Please call patient:  Received refill request for Zoloft 100 mg pills from [facility]. She weaned off Zoloft in May last year. Did she resume?

## 2023-09-19 NOTE — Telephone Encounter (Signed)
 Pt states she never stopped taking zoloft, she just spilt the dosage in half

## 2023-09-20 MED ORDER — SERTRALINE HCL 50 MG PO TABS: 50.0000 mg | ORAL_TABLET | Freq: Every day | ORAL | 0 refills | Status: DC

## 2023-09-20 NOTE — Telephone Encounter (Signed)
 Just to clarify, she is taking 50 mg once daily of Zoloft? If so, then I can send her the 50 mg pill so she doesn't have to cut the pill in half. Let me know.

## 2023-09-20 NOTE — Telephone Encounter (Signed)
 Called and spoke to patient. Yes, she has been taking 50 mg dose once a day, she would like the 50mg  pills sent.

## 2023-09-20 NOTE — Telephone Encounter (Signed)
 Noted, Rx for 50 mg dose sent to pharmacy

## 2023-10-05 ENCOUNTER — Encounter: Payer: Self-pay | Admitting: Dermatology

## 2023-10-05 ENCOUNTER — Ambulatory Visit (INDEPENDENT_AMBULATORY_CARE_PROVIDER_SITE_OTHER): Payer: Self-pay | Admitting: Dermatology

## 2023-10-05 DIAGNOSIS — L814 Other melanin hyperpigmentation: Secondary | ICD-10-CM

## 2023-10-05 DIAGNOSIS — L821 Other seborrheic keratosis: Secondary | ICD-10-CM

## 2023-10-05 DIAGNOSIS — L578 Other skin changes due to chronic exposure to nonionizing radiation: Secondary | ICD-10-CM

## 2023-10-05 MED ORDER — VALACYCLOVIR HCL 500 MG PO TABS: ORAL_TABLET | ORAL | 11 refills | Status: DC

## 2023-10-05 NOTE — Progress Notes (Signed)
   Follow-Up Visit   Subjective  Michelle Figueroa is a 63 y.o. female who presents for the following: bbl treatment  1st session  The patient has spots, moles and lesions to be evaluated, some may be new or changing and the patient may have concern these could be cancer.  The following portions of the chart were reviewed this encounter and updated as appropriate: medications, allergies, medical history  Review of Systems:  No other skin or systemic complaints except as noted in HPI or Assessment and Plan.  Objective  Well appearing patient in no apparent distress; mood and affect are within normal limits.  A focused examination was performed of the following areas: Face  Relevant exam findings are noted in the Assessment and Plan.                        Assessment & Plan   LENTIGINES/SKS and ACTINIC DAMAGE  Exam: scattered tan macules at face Treatment Plan: BBL Treatment today  Laser safety: Patient was advised in laser safety.  Patient was fitted with laser safety goggles and advised to keep eyes closed during procedure with goggles on. Staff and provider ensured that patient and their own safety goggles were also on and eyes protected during procedure. Laser room door was secured and locked from the inside. Laser room door has laser safety sign affixed to the outside of the door.   Start Valtrex 500 mg - take 1 tab po bid for 7 days, starting 1 day prior to procedure   [place] BBL - 10/05/23 1400      Patient Details   Skin Type: I    Anesthestic Cream Applied: No    Photo Takes: Yes    Consent Signed: Yes    Improvement from Previous Treatment: No      Treatment Details   Date: 10/05/23    Treatment #: 1    Area: face    Filter: 1st Pass;2nd Pass      1st Pass   Location: F    Device: 15    BBL j/cm2: 15    Cooling Temp: 15    Pulses: 57    7mm: this one      2nd Pass   Location: F    Device: 515    BBL j/cm2: 12    PW Msec Sec: 10     Cooling Temp: 25    Pulses: 188    11mm: this one     Patient tolerated the procedure well.   Wynelle Link avoidance was stressed. The patient will call with any problems, questions or concerns prior to their next appointment.   Return if symptoms worsen or fail to improve.  IAsher Muir, CMA, am acting as scribe for Armida Sans, MD.   Documentation: I have reviewed the above documentation for accuracy and completeness, and I agree with the above.  Armida Sans, MD

## 2023-10-05 NOTE — Patient Instructions (Addendum)
 Start Valtrex 500 mg - take 1 tab po bid for 7 days, starting 1 day prior to procedure    Due to recent changes in healthcare laws, you may see results of your pathology and/or laboratory studies on MyChart before the doctors have had a chance to review them. We understand that in some cases there may be results that are confusing or concerning to you. Please understand that not all results are received at the same time and often the doctors may need to interpret multiple results in order to provide you with the best plan of care or course of treatment. Therefore, we ask that you please give [place] 2 business days to thoroughly review all your results before contacting the office for clarification. Should we see a critical lab result, you will be contacted sooner.   If You Need Anything After Your Visit  If you have any questions or concerns for your doctor, please call our main line at [phone] and press option 4 to reach your doctor's medical assistant. If no one answers, please leave a voicemail as directed and we will return your call as soon as possible. Messages left after 4 pm will be answered the following business day.   You may also send [place] a message via MyChart. We typically respond to MyChart messages within 1-2 business days.  For prescription refills, please ask your pharmacy to contact our office. Our fax number is [phone].  If you have an urgent issue when the clinic is closed that cannot wait until the next business day, you can page your doctor at the number below.    Please note that while we do our best to be available for urgent issues outside of office hours, we are not available 24/7.   If you have an urgent issue and are unable to reach [place], you may choose to seek medical care at your doctor's office, retail clinic, urgent care center, or emergency room.  If you have a medical emergency, please immediately call 911 or go to the emergency department.  Pager  Numbers  - Dr. Gwen Pounds: [phone]  - Dr. Roseanne Reno: [phone]  - Dr. Katrinka Blazing: [phone]   In the event of inclement weather, please call our main line at [phone] for an update on the status of any delays or closures.  Dermatology Medication Tips: Please keep the boxes that topical medications come in in order to help keep track of the instructions about where and how to use these. Pharmacies typically print the medication instructions only on the boxes and not directly on the medication tubes.   If your medication is too expensive, please contact our office at [phone] option 4 or send [place] a message through MyChart.   We are unable to tell what your co-pay for medications will be in advance as this is different depending on your insurance coverage. However, we may be able to find a substitute medication at lower cost or fill out paperwork to get insurance to cover a needed medication.   If a prior authorization is required to get your medication covered by your insurance company, please allow [place] 1-2 business days to complete this process.  Drug prices often vary depending on where the prescription is filled and some pharmacies may offer cheaper prices.  The website www.goodrx.com contains coupons for medications through different pharmacies. The prices here do not account for what the cost may be with help from insurance (it may be cheaper with your insurance), but the website can  give you the price if you did not use any insurance.  - You can print the associated coupon and take it with your prescription to the pharmacy.  - You may also stop by our office during regular business hours and pick up a GoodRx coupon card.  - If you need your prescription sent electronically to a different pharmacy, notify our office through [facility] or by phone at [phone] option 4.     Si Usted Necesita Algo Despus de Su Visita  Tambin puede enviarnos un mensaje a travs de  Clinical cytogeneticist. Por lo general respondemos a los mensajes de MyChart en el transcurso de 1 a 2 das hbiles.  Para renovar recetas, por favor pida a su farmacia que se ponga en contacto con nuestra oficina. Annie Sable de fax es [place] [phone].  Si tiene un asunto urgente cuando la clnica est cerrada y que no puede esperar hasta el siguiente da hbil, puede llamar/localizar a su doctor(a) al nmero que aparece a continuacin.   Por favor, tenga en cuenta que aunque hacemos todo lo posible para estar disponibles para asuntos urgentes fuera del horario de [place], no estamos disponibles las 24 horas del da, los 7 [address]  [address] de la [place].   Si tiene un problema urgente y no puede comunicarse con nosotros, puede optar por buscar atencin mdica  en el consultorio de su doctor(a), en una clnica privada, en un centro de atencin urgente o en una sala de emergencias.  Si tiene Engineer, drilling, por favor llame inmediatamente al 911 o vaya a la sala de emergencias.  Nmeros de bper  - Dr. Gwen Pounds: [phone]  - Dra. Roseanne Reno: [phone]  - Dr. Katrinka Blazing: [phone]   En caso de inclemencias del tiempo, por favor llame a Lacy Duverney principal al [phone] para una actualizacin sobre el [place] de cualquier retraso o cierre.  Consejos para la medicacin en dermatologa: Por favor, guarde las cajas en las que vienen los medicamentos de uso tpico para ayudarle a seguir las instrucciones sobre dnde y cmo usarlos. Las farmacias generalmente imprimen las instrucciones del medicamento slo en las cajas y no directamente en los tubos del [place].   Si su medicamento es muy caro, por favor, pngase en contacto con Rolm Gala llamando al [phone] y presione la opcin 4 o envenos un mensaje a travs de Clinical cytogeneticist.   No podemos decirle cul ser su copago por los medicamentos por adelantado ya que esto es diferente dependiendo de la cobertura de su seguro. Sin embargo, es posible que  podamos encontrar un medicamento sustituto a Audiological scientist un formulario para que el seguro cubra el medicamento que se considera necesario.   Si se requiere una autorizacin previa para que su compaa de seguros [place] su medicamento, por favor permtanos de 1 a 2 das hbiles para completar [address]  Los precios de los medicamentos varan con frecuencia dependiendo del Environmental consultant de dnde se surte la receta y alguna farmacias pueden ofrecer precios ms baratos.  El sitio web www.goodrx.com tiene cupones para medicamentos de Health and safety inspector. Los precios aqu no tienen en cuenta lo que podra costar con la ayuda del seguro (puede ser ms barato con su seguro), pero el sitio web puede darle el precio si no utiliz Tourist information centre manager.  - Puede imprimir el cupn correspondiente y llevarlo con su receta a la farmacia.  - Tambin puede pasar por nuestra oficina durante el horario de atencin regular y Education officer, museum una tarjeta de cupones de GoodRx.  -  Si necesita que su receta se enve electrnicamente a Psychiatrist, informe a nuestra oficina a travs de MyChart de [place] o por telfono llamando al [phone] y presione la opcin 4.

## 2023-10-10 ENCOUNTER — Telehealth: Payer: Self-pay

## 2023-10-10 DIAGNOSIS — M5412 Radiculopathy, cervical region: Secondary | ICD-10-CM | POA: Diagnosis not present

## 2023-10-10 NOTE — Telephone Encounter (Signed)
 Called patient to see how she was feeling after recent BBL procedure. Patient states she is doing well, has noticed some of the darkened spots are fading slowly. She is overall very pleased and has not had any adverse effects to treatment.   Denies questions or concerns. Verbalized she looks forward to her next treatment.

## 2023-10-21 ENCOUNTER — Other Ambulatory Visit (HOSPITAL_BASED_OUTPATIENT_CLINIC_OR_DEPARTMENT_OTHER): Payer: Self-pay | Admitting: Obstetrics & Gynecology

## 2023-10-21 ENCOUNTER — Telehealth (HOSPITAL_BASED_OUTPATIENT_CLINIC_OR_DEPARTMENT_OTHER): Payer: Self-pay | Admitting: *Deleted

## 2023-10-21 DIAGNOSIS — R6882 Decreased libido: Secondary | ICD-10-CM

## 2023-10-21 NOTE — Telephone Encounter (Signed)
-----   Message from Jerene Bears sent at 10/21/2023  5:40 AM EDT ----- Regarding: follow up testosterone level Michelle Figueroa, Pt was started on testosterone in December.  Would like to recheck this in 3 months.  Future order has been placed.  She lives in [place].  Can she do there at a local [facility].  Order released to be able to do that if she wants.  If not, I can change the order to do here.  Thanks.  MSM

## 2023-10-21 NOTE — Telephone Encounter (Signed)
 DPR reviewed. LMOVM that repeat testosterone lab is recommended. Advised that the order has been placed and she can go to any [facility] to get this done. Advised that if she would like this done here at [facility], she should call us so that we can set up an appt for her.

## 2023-10-31 DIAGNOSIS — M5412 Radiculopathy, cervical region: Secondary | ICD-10-CM | POA: Diagnosis not present

## 2023-11-07 DIAGNOSIS — M5412 Radiculopathy, cervical region: Secondary | ICD-10-CM | POA: Diagnosis not present

## 2023-11-08 ENCOUNTER — Ambulatory Visit (INDEPENDENT_AMBULATORY_CARE_PROVIDER_SITE_OTHER): Payer: BC Managed Care – PPO | Admitting: Primary Care

## 2023-11-08 ENCOUNTER — Encounter: Payer: Self-pay | Admitting: Primary Care

## 2023-11-08 VITALS — BP 152/100 | HR 83 | Temp 98.2°F | Ht 66.0 in | Wt 174.8 lb

## 2023-11-08 DIAGNOSIS — Z7985 Long-term (current) use of injectable non-insulin antidiabetic drugs: Secondary | ICD-10-CM | POA: Diagnosis not present

## 2023-11-08 DIAGNOSIS — Z0001 Encounter for general adult medical examination with abnormal findings: Secondary | ICD-10-CM

## 2023-11-08 DIAGNOSIS — E7800 Pure hypercholesterolemia, unspecified: Secondary | ICD-10-CM

## 2023-11-08 DIAGNOSIS — I10 Essential (primary) hypertension: Secondary | ICD-10-CM | POA: Diagnosis not present

## 2023-11-08 DIAGNOSIS — R110 Nausea: Secondary | ICD-10-CM | POA: Diagnosis not present

## 2023-11-08 DIAGNOSIS — F411 Generalized anxiety disorder: Secondary | ICD-10-CM

## 2023-11-08 DIAGNOSIS — K219 Gastro-esophageal reflux disease without esophagitis: Secondary | ICD-10-CM

## 2023-11-08 DIAGNOSIS — E1165 Type 2 diabetes mellitus with hyperglycemia: Secondary | ICD-10-CM | POA: Diagnosis not present

## 2023-11-08 LAB — COMPREHENSIVE METABOLIC PANEL WITH GFR
ALT: 37 U/L — ABNORMAL HIGH (ref 0–35)
AST: 36 U/L (ref 0–37)
Albumin: 4.2 g/dL (ref 3.5–5.2)
Alkaline Phosphatase: 101 U/L (ref 39–117)
BUN: 19 mg/dL (ref 6–23)
CO2: 30 meq/L (ref 19–32)
Calcium: 9.5 mg/dL (ref 8.4–10.5)
Chloride: 103 meq/L (ref 96–112)
Creatinine, Ser: 0.81 mg/dL (ref 0.40–1.20)
GFR: 78.31 mL/min (ref >60.00)
Glucose, Bld: 97 mg/dL (ref 70–99)
Potassium: 4.7 meq/L (ref 3.5–5.1)
Sodium: 138 meq/L (ref 135–145)
Total Bilirubin: 0.7 mg/dL (ref 0.2–1.2)
Total Protein: 6.9 g/dL (ref 6.0–8.3)

## 2023-11-08 LAB — LIPID PANEL
Cholesterol: 166 mg/dL (ref 0–200)
HDL: 59.1 mg/dL (ref >39.00)
LDL Cholesterol: 80 mg/dL (ref 0–99)
NonHDL: 106.8
Total CHOL/HDL Ratio: 3
Triglycerides: 135 mg/dL (ref 0.0–149.0)
VLDL: 27 mg/dL (ref 0.0–40.0)

## 2023-11-08 LAB — MICROALBUMIN / CREATININE URINE RATIO
Creatinine,U: 118.1 mg/dL
Microalb Creat Ratio: 8.6 mg/g (ref 0.0–30.0)
Microalb, Ur: 1 mg/dL (ref 0.0–1.9)

## 2023-11-08 LAB — HEMOGLOBIN A1C: Hgb A1c MFr Bld: 5.1 % (ref 4.6–6.5)

## 2023-11-08 MED ORDER — VALSARTAN 160 MG PO TABS: 160.0000 mg | ORAL_TABLET | Freq: Every day | ORAL | 3 refills | Status: DC

## 2023-11-08 MED ORDER — SEMAGLUTIDE (2 MG/DOSE) 8 MG/3ML ~~LOC~~ SOPN: 2.0000 mg | PEN_INJECTOR | SUBCUTANEOUS | 1 refills | Status: DC

## 2023-11-08 MED ORDER — ONDANSETRON 4 MG PO TBDP: 4.0000 mg | ORAL_TABLET | Freq: Three times a day (TID) | ORAL | 0 refills | Status: AC | PRN

## 2023-11-08 NOTE — Assessment & Plan Note (Signed)
Controlled.  Continue sertraline 50 mg daily.

## 2023-11-08 NOTE — Assessment & Plan Note (Addendum)
 Repeat A1c pending.  Agreed to increase Ozempic  to 2 mg weekly given weight plateau. Will refill Zofran  per patient request given intermittent nausea with Ozempic . Urine microalbumin done pending.  Follow-up in 6 months.

## 2023-11-08 NOTE — Progress Notes (Signed)
 Subjective:    Patient ID: Michelle Figueroa, female    DOB: 1961/01/10, 63 y.o.   MRN: [id number]  HPI  [place] is a very pleasant 63 y.o. female who presents today for complete physical and follow up of chronic conditions.  She's noticed increased BP readings since the passing of her mother January 2025. BP is ranging 140-150/100s. Symptoms include facial flushing and headaches intermittently. She denies chest pain.   She has noticed her weight plateau. Would like to increase her dose of Ozempic  to 2 mg weekly. She is also requesting a refill of her Zofran  to use PRN.   Immunizations: -Tetanus: Completed in 2017 -Influenza: Completed last season  -Shingles: Completed Shingrix  series -Pneumonia: Completed Pneumovax 23 in 2020  Diet: Fair diet.  Exercise: No regular exercise.  Eye exam: Completes annually  Dental exam: Completes semi-annually    Pap Smear: Completed in 2023 Mammogram: Completed in September 2024  Colonoscopy: Completed in 2023, due 2028  BP Readings from Last 3 Encounters:  11/08/23 (!) 160/100  09/05/23 124/82  06/20/23 (!) 160/94   Wt Readings from Last 3 Encounters:  11/08/23 174 lb 12.8 oz (79.3 kg)  09/05/23 171 lb 6.4 oz (77.7 kg)  06/20/23 175 lb 3.2 oz (79.5 kg)      Review of Systems  Constitutional:  Negative for unexpected weight change.  HENT:  Negative for rhinorrhea.   Respiratory:  Negative for cough and shortness of breath.   Cardiovascular:  Negative for chest pain.  Gastrointestinal:  Negative for constipation and diarrhea.  Genitourinary:  Negative for difficulty urinating.  Musculoskeletal:  Negative for arthralgias and myalgias.  Skin:  Negative for rash.  Allergic/Immunologic: Negative for environmental allergies.  Neurological:  Positive for headaches. Negative for dizziness.  Psychiatric/Behavioral:  The patient is not nervous/anxious.          Past Medical History:  Diagnosis Date   Acute diarrhea  10/26/2022   Diabetes mellitus without complication (HCC)    followed by PCP   Diverticulosis    noted on colonoscopy   Essential hypertension    GERD (gastroesophageal reflux disease)    Hemorrhoids    Insomnia    Persistent cough for 3 weeks or longer 08/25/2021    Social History   Socioeconomic History   Marital status: Married    Spouse name: Not on file   Number of children: Not on file   Years of education: Not on file   Highest education level: Not on file  Occupational History   Not on file  Tobacco Use   Smoking status: Never   Smokeless tobacco: Never  Vaping Use   Vaping status: Never Used  Substance and Sexual Activity   Alcohol use: Yes    Alcohol/week: 3.0 standard drinks of alcohol    Types: 3 Standard drinks or equivalent per week   Drug use: No   Sexual activity: Yes    Birth control/protection: Post-menopausal  Other Topics Concern   Not on file  Social History Narrative   Married.   2 children.   Works in [employer] at [employer].    Enjoys traveling, spending time with family.    Social Drivers of Corporate investment banker Strain: Not on file  Food Insecurity: Not on file  Transportation Needs: Not on file  Physical Activity: Not on file  Stress: Not on file  Social Connections: Unknown (03/14/2022)   [facility]   Social Network  Social Network: Not on file  Intimate Partner Violence: Unknown (03/14/2022)   [facility]   HITS    Physically Hurt: Not on file    Insult or Talk Down To: Not on file    Threaten Physical Harm: Not on file    Scream or Curse: Not on file    Past Surgical History:  Procedure Laterality Date   APPENDECTOMY  2009   CERVICAL FUSION  2010   CESAREAN SECTION     CHOLECYSTECTOMY  2000   [facility] Suspension Arthroplasty Left 12/25/2020   left thumb with tight rope and tendon interposition   ENDOMETRIAL ABLATION     GYNECOLOGIC CRYOSURGERY      RETROPUBIC SLING  10/2009   Dr. Washington  at [facility]   REVISION URINARY SLING  11/19/2009   with cysto due to urinary retension    Family History  Problem Relation Age of Onset   Arthritis Mother    Diabetes Mother    Colon cancer Mother 69   Lung cancer Father    Hypertension Father    Heart attack Father    Heart attack Brother 56   Breast cancer Neg Hx     Allergies  Allergen Reactions   Erythromycin Ethylsuccinate Rash   Oxycodone-Acetaminophen Nausea Only    Current Outpatient Medications on File Prior to Visit  Medication Sig Dispense Refill   estradiol  (ESTRACE  VAGINAL) 0.1 MG/GM vaginal cream 1 gram pv twice weekly 42.5 g 3   omeprazole  (PRILOSEC) 20 MG capsule TAKE 1 CAPSULE BY MOUTH ONCE DAILY FOR HEARTBURN 90 capsule 0   pravastatin  (PRAVACHOL ) 40 MG tablet TAKE ONE TABLET BY MOUTH DAILY FOR CHOLESTEROL 90 tablet 0   sertraline  (ZOLOFT ) 50 MG tablet Take 1 tablet (50 mg total) by mouth daily. For anxiety 90 tablet 0   tretinoin  (RETIN-A ) 0.025 % cream Apply pea-sized amount to face at bedtime, wash off in morning. 20 g 3   tretinoin  (RETIN-A ) 0.025 % cream Apply topically at bedtime. 45 g 2   valACYclovir  (VALTREX ) 500 MG tablet Take 1 tab po bid for 7 days, starting 1 day prior to procedure 60 tablet 11   No current facility-administered medications on file prior to visit.    BP (!) 160/100   Pulse 83   Temp 98.2 F (36.8 C) (Oral)   Ht 5\' 6"  (1.676 m)   Wt 174 lb 12.8 oz (79.3 kg)   SpO2 98%   BMI 28.21 kg/m  Objective:   Physical Exam HENT:     Right Ear: Tympanic membrane and ear canal normal.     Left Ear: Tympanic membrane and ear canal normal.  Eyes:     Pupils: Pupils are equal, round, and reactive to light.  Cardiovascular:     Rate and Rhythm: Normal rate and regular rhythm.  Pulmonary:     Effort: Pulmonary effort is normal.     Breath sounds: Normal breath sounds.  Abdominal:     General: Bowel sounds are normal.      Palpations: Abdomen is soft.     Tenderness: There is no abdominal tenderness.  Musculoskeletal:        General: Normal range of motion.     Cervical back: Neck supple.  Skin:    General: Skin is warm and dry.  Neurological:     Mental Status: She is alert and oriented to person, place, and time.     Cranial Nerves: No cranial nerve deficit.  Deep Tendon Reflexes:     Reflex Scores:      Patellar reflexes are 2+ on the right side and 2+ on the left side. Psychiatric:        Mood and Affect: Mood normal.           Assessment & Plan:  Encounter for annual general medical examination with abnormal findings in adult Assessment & Plan: Immunizations UTD. Pap smear UTD. Mammogram UTD Colonoscopy UTD, due 2028  Discussed the importance of a healthy diet and regular exercise in order for weight loss, and to reduce the risk of further co-morbidity.  Exam stable. Labs pending.  Follow up in 1 year for repeat physical.    Nausea -     Ondansetron ; Take 1 tablet (4 mg total) by mouth every 8 (eight) hours as needed for nausea or vomiting.  Dispense: 15 tablet; Refill: 0  Controlled type 2 diabetes mellitus with hyperglycemia, without long-term current use of insulin (HCC) Assessment & Plan: Repeat A1c pending.  Agreed to increase Ozempic  to 2 mg weekly given weight plateau. Will refill Zofran  per patient request given intermittent nausea with Ozempic . Urine microalbumin done pending.  Follow-up in 6 months.  Orders: -     Semaglutide  (2 MG/DOSE); Inject 2 mg as directed once a week. for diabetes.  Dispense: 9 mL; Refill: 1 -     Hemoglobin A1c -     Microalbumin / creatinine urine ratio  Essential hypertension Assessment & Plan: Above goal today, also with home readings.  Increase valsartan  to 160 mg daily. She will send readings via MyChart in 2 weeks.  CMP pending.  Orders: -     Comprehensive metabolic panel with GFR -     Valsartan ; Take 1 tablet (160 mg  total) by mouth daily. for blood pressure.  Dispense: 90 tablet; Refill: 3  Pure hypercholesterolemia Assessment & Plan: Repeat lipid panel pending.  Continue pravastatin  40 mg daily.  Orders: -     Lipid panel  Gastroesophageal reflux disease, unspecified whether esophagitis present Assessment & Plan: Controlled.  Continue omeprazole  20 mg daily.   GAD (generalized anxiety disorder) Assessment & Plan: Controlled.  Continue sertraline  50 mg daily.         Jospeh Mangel K Mickell Birdwell, NP

## 2023-11-08 NOTE — Assessment & Plan Note (Signed)
Immunizations UTD. Pap smear UTD. Mammogram UTD Colonoscopy UTD, due 2028  Discussed the importance of a healthy diet and regular exercise in order for weight loss, and to reduce the risk of further co-morbidity.  Exam stable. Labs pending.  Follow up in 1 year for repeat physical.  

## 2023-11-08 NOTE — Assessment & Plan Note (Signed)
 Above goal today, also with home readings.  Increase valsartan  to 160 mg daily. She will send readings via MyChart in 2 weeks.  CMP pending.

## 2023-11-08 NOTE — Patient Instructions (Signed)
 We increased the dose of your valsartan  to 160 mg daily.  I sent a new prescription to your pharmacy.  We increased the dose of your Ozempic  to 2 mg weekly for weight loss.  Please keep me updated.  Stop by the lab prior to leaving today. I will notify you of your results once received.   Please schedule a follow up visit for 6 months for a diabetes check.  It was a pleasure to see you today!

## 2023-11-08 NOTE — Assessment & Plan Note (Signed)
Controlled.   Continue omeprazole 20 mg daily. 

## 2023-11-08 NOTE — Assessment & Plan Note (Signed)
Repeat lipid panel pending. Continue pravastatin 40 mg daily. 

## 2023-12-21 ENCOUNTER — Other Ambulatory Visit: Payer: Self-pay | Admitting: Primary Care

## 2023-12-21 DIAGNOSIS — F411 Generalized anxiety disorder: Secondary | ICD-10-CM

## 2023-12-22 ENCOUNTER — Other Ambulatory Visit: Payer: Self-pay | Admitting: Primary Care

## 2023-12-22 DIAGNOSIS — E7800 Pure hypercholesterolemia, unspecified: Secondary | ICD-10-CM

## 2023-12-22 DIAGNOSIS — K219 Gastro-esophageal reflux disease without esophagitis: Secondary | ICD-10-CM

## 2024-02-16 ENCOUNTER — Other Ambulatory Visit: Payer: Self-pay | Admitting: Primary Care

## 2024-02-16 DIAGNOSIS — Z1231 Encounter for screening mammogram for malignant neoplasm of breast: Secondary | ICD-10-CM

## 2024-03-21 LAB — OPHTHALMOLOGY REPORT-SCANNED

## 2024-03-28 ENCOUNTER — Ambulatory Visit
Admission: RE | Admit: 2024-03-28 | Discharge: 2024-03-28 | Disposition: A | Discharge status: Home / Self Care | Source: Ambulatory Visit | Attending: Primary Care | Admitting: Primary Care

## 2024-03-28 DIAGNOSIS — Z1231 Encounter for screening mammogram for malignant neoplasm of breast: Secondary | ICD-10-CM | POA: Diagnosis not present

## 2024-03-30 ENCOUNTER — Ambulatory Visit: Payer: Self-pay | Admitting: Primary Care

## 2024-04-11 ENCOUNTER — Ambulatory Visit: Payer: Self-pay | Admitting: Dermatology

## 2024-04-11 DIAGNOSIS — L578 Other skin changes due to chronic exposure to nonionizing radiation: Secondary | ICD-10-CM

## 2024-04-11 DIAGNOSIS — L814 Other melanin hyperpigmentation: Secondary | ICD-10-CM

## 2024-04-11 DIAGNOSIS — L821 Other seborrheic keratosis: Secondary | ICD-10-CM

## 2024-04-11 NOTE — Patient Instructions (Signed)

## 2024-04-11 NOTE — Progress Notes (Unsigned)
   Follow-Up Visit   Subjective  Michelle Figueroa is a 63 y.o. female who presents for the following: here today for 2nd BBL treatment for brown spots on face  The following portions of the chart were reviewed this encounter and updated as appropriate: medications, allergies, medical history  Review of Systems:  No other skin or systemic complaints except as noted in HPI or Assessment and Plan.  Objective  Well appearing patient in no apparent distress; mood and affect are within normal limits.  A focused examination was performed of the following areas: face  Relevant exam findings are noted in the Assessment and Plan.  Today's before photos BBL        1st pass   1st pass    2nd pass    2nd pass     Assessment & Plan   LENTIGINES/SKS and ACTINIC DAMAGE  Exam: scattered tan macules at face Treatment Plan: BBL Treatment today   Laser safety: Patient was advised in laser safety.  Patient was fitted with laser safety goggles and advised to keep eyes closed during procedure with goggles on. Staff and provider ensured that patient and their own safety goggles were also on and eyes protected during procedure. Laser room door was secured and locked from the inside. Laser room door has laser safety sign affixed to the outside of the door.    Start Valtrex  500 mg - take 1 tab po bid for 7 days, starting 1 day prior to procedure   [place] BBL - 04/11/24 1400      Patient Details   Skin Type: I    Anesthestic Cream Applied: No    Photo Takes: Yes    Consent Signed: Yes    Improvement from Previous Treatment: Yes      Treatment Details   Date: 04/11/24    Treatment #: 2    Area: face    Filter: 1st Pass;2nd Pass      1st Pass   Location: F    Device: 515   spot treat lentigines at cheeks, temples forehead   BBL j/cm2: 15    PW Msec Sec: 15    Target Temp: 15    Pulses: 66    7mm: this one      2nd Pass   Location: F    Device: 515   treatment of  browns forehead, cheeks, temples, nose, upper  lip, chin   BBL j/cm2: 12    PW Msec Sec: 10    Target Temp: 25    Pulses: 112    15x15: this one       Patient tolerated the procedure well.   [place] avoidance was stressed. The patient will call with any problems, questions or concerns prior to their next appointment.  ACTINIC DAMAGE - chronic, secondary to cumulative UV radiation exposure/sun exposure over time - diffuse scaly erythematous macules with underlying dyspigmentation - Recommend daily broad spectrum sunscreen SPF 30+ to sun-exposed areas, reapply every 2 hours as needed.  - Recommend staying in the shade or wearing long sleeves, sun glasses (UVA+UVB protection) and wide brim hats (4-inch brim around the entire circumference of the hat). - Call for new or changing lesions.    Return for schedule for bbl.  IEleanor Blush, CMA, am acting as scribe for Alm Rhyme, MD.   Documentation: I have reviewed the above documentation for accuracy and completeness, and I agree with the above.  Alm Rhyme, MD

## 2024-04-12 ENCOUNTER — Encounter: Payer: Self-pay | Admitting: Dermatology

## 2024-04-17 ENCOUNTER — Telehealth: Payer: Self-pay

## 2024-04-17 NOTE — Telephone Encounter (Addendum)
 Tried calling patient regarding recent BBL procedure last week to see how she was doing. No answer. Lm for patient to return call.  ----- Message from Alm Rhyme sent at 04/12/2024  3:40 PM EDT ----- Please call pt to see how she is doing after laser tx.

## 2024-04-27 ENCOUNTER — Ambulatory Visit: Payer: Self-pay | Admitting: Primary Care

## 2024-04-27 ENCOUNTER — Ambulatory Visit: Admitting: Primary Care

## 2024-04-27 ENCOUNTER — Encounter: Payer: Self-pay | Admitting: Primary Care

## 2024-04-27 VITALS — BP 146/88 | HR 88 | Temp 97.2°F | Ht 66.0 in | Wt 176.0 lb

## 2024-04-27 DIAGNOSIS — Z23 Encounter for immunization: Secondary | ICD-10-CM | POA: Diagnosis not present

## 2024-04-27 DIAGNOSIS — I10 Essential (primary) hypertension: Secondary | ICD-10-CM

## 2024-04-27 DIAGNOSIS — E1165 Type 2 diabetes mellitus with hyperglycemia: Secondary | ICD-10-CM

## 2024-04-27 DIAGNOSIS — Z7985 Long-term (current) use of injectable non-insulin antidiabetic drugs: Secondary | ICD-10-CM

## 2024-04-27 DIAGNOSIS — E119 Type 2 diabetes mellitus without complications: Secondary | ICD-10-CM

## 2024-04-27 LAB — POCT GLYCOSYLATED HEMOGLOBIN (HGB A1C): Hemoglobin A1C: 4.9 % (ref 4.0–5.6)

## 2024-04-27 MED ORDER — VALSARTAN-HYDROCHLOROTHIAZIDE 160-12.5 MG PO TABS: 1.0000 | ORAL_TABLET | Freq: Every day | ORAL | 0 refills | Status: DC

## 2024-04-27 NOTE — Assessment & Plan Note (Signed)
 Above goal today, also with home readings and prior visits.  Stop valsartan  160 mg daily. Start valsartan  hydrochlorothiazide 160-12.5 mg daily.  Will plan to see her back in 2 to 3 weeks for blood pressure check and BMP

## 2024-04-27 NOTE — Patient Instructions (Signed)
 Stop taking valsartan  160 mg for blood pressure.  Start taking valsartan -hydrochlorothiazide 160-12.5 mg daily for blood pressure.  Please schedule a follow up visit to meet back with me in 2-3 weeks for blood pressure check.   It was a pleasure to see you today!

## 2024-04-27 NOTE — Progress Notes (Signed)
 Subjective:    Patient ID: Michelle Figueroa, female    DOB: [birth year], 62 y.o.   MRN: [id number]  Michelle Figueroa is a very pleasant 63 y.o. female with a history of type 2 diabetes, hypertension, hyperlipidemia who presents today for follow-up of diabetes and hypertension.   1) Type 2 Diabetes:  Current medications include: Ozempic  2 mg weekly  She is checking her blood glucose 0 times daily.  Last A1C: 5.1 in April 2025 Last Eye Exam: Due Last Foot Exam: Due Pneumonia Vaccination: 2020 Urine Microalbumin: UTD Statin: pravastatin    Dietary changes since last visit: Not eating well.   Exercise: No regular exercise   Wt Readings from Last 3 Encounters:  04/27/24 176 lb (79.8 kg)  11/08/23 174 lb 12.8 oz (79.3 kg)  09/05/23 171 lb 6.4 oz (77.7 kg)     2) Hypertension: Currently managed on valsartan  160 mg daily. Over the last 6 months she's noticed BP readings at home running 140-150/100.  She has also noticed intermittent facial flushing and headaches.  She does have family history of hypertension in her father.  She is compliant to valsartan  160 mg daily.  BP Readings from Last 3 Encounters:  04/27/24 (!) 146/88  11/08/23 (!) 152/100  09/05/23 124/82     Review of Systems  Respiratory:  Negative for shortness of breath.   Cardiovascular:  Negative for chest pain.  Gastrointestinal:  Negative for constipation and diarrhea.  Neurological:  Positive for headaches.         Past Medical History:  Diagnosis Date   Acute diarrhea 10/26/2022   Diabetes mellitus without complication (HCC)    followed by PCP   Diverticulosis    noted on colonoscopy   Essential hypertension    GERD (gastroesophageal reflux disease)    Hemorrhoids    Insomnia    Persistent cough for 3 weeks or longer 08/25/2021    Social History   Socioeconomic History   Marital status: Married    Spouse name: Not on file   Number of children: Not on file   Years of education: Not on file    Highest education level: Master's degree (e.g., MA, MS, MEng, MEd, MSW, MBA)  Occupational History   Not on file  Tobacco Use   Smoking status: Never   Smokeless tobacco: Never  Vaping Use   Vaping status: Never Used  Substance and Sexual Activity   Alcohol use: Yes    Alcohol/week: 3.0 standard drinks of alcohol    Types: 3 Standard drinks or equivalent per week   Drug use: No   Sexual activity: Yes    Birth control/protection: Post-menopausal  Other Topics Concern   Not on file  Social History Narrative   Married.   2 children.   Works in [employer] at [employer].    Enjoys traveling, spending time with family.    Social Drivers of Corporate investment banker Strain: Low Risk  (04/26/2024)   Overall Financial Resource Strain (CARDIA)    Difficulty of Paying Living Expenses: Not hard at all  Food Insecurity: No Food Insecurity (04/26/2024)   Hunger Vital Sign    Worried About Running Out of Food in the Last Year: Never true    Ran Out of Food in the Last Year: Never true  Transportation Needs: No Transportation Needs (04/26/2024)   PRAPARE - Administrator, Civil Service (Medical): No    Lack of Transportation (Non-Medical): No  Physical Activity: Insufficiently  Active (04/26/2024)   Exercise Vital Sign    Days of Exercise per Week: 1 day    Minutes of Exercise per Session: 20 min  Stress: No Stress Concern Present (04/26/2024)   [employer] of Occupational Health - Occupational Stress Questionnaire    Feeling of Stress: Only a little  Social Connections: Moderately Integrated (04/26/2024)   Social Connection and Isolation Panel    Frequency of Communication with Friends and Family: More than three times a week    Frequency of Social Gatherings with Friends and Family: More than three times a week    Attends Religious Services: 1 to 4 times per year    Active Member of [employer] or Organizations: No    Attends Banker Meetings:  Not on file    Marital Status: Married  Intimate Partner Violence: Unknown (03/14/2022)   Received from [facility]   HITS    Physically Hurt: Not on file    Insult or Talk Down To: Not on file    Threaten Physical Harm: Not on file    Scream or Curse: Not on file    Past Surgical History:  Procedure Laterality Date   APPENDECTOMY  2009   CERVICAL FUSION  2010   CESAREAN SECTION     CHOLECYSTECTOMY  2000   [facility] Suspension Arthroplasty Left 12/25/2020   left thumb with tight rope and tendon interposition   ENDOMETRIAL ABLATION     GYNECOLOGIC CRYOSURGERY     RETROPUBIC SLING  10/2009   Dr. Verda  at [facility]   REVISION URINARY SLING  11/19/2009   with cysto due to urinary retension    Family History  Problem Relation Age of Onset   Arthritis Mother    Diabetes Mother    Colon cancer Mother 18   Lung cancer Father    Hypertension Father    Heart attack Father    Heart attack Brother 50   Breast cancer Neg Hx     Allergies  Allergen Reactions   Erythromycin Ethylsuccinate Rash   Oxycodone-Acetaminophen Nausea Only    Current Outpatient Medications on File Prior to Visit  Medication Sig Dispense Refill   estradiol  (ESTRACE  VAGINAL) 0.1 MG/GM vaginal cream 1 gram pv twice weekly 42.5 g 3   omeprazole  (PRILOSEC) 20 MG capsule TAKE 1 CAPSULE BY MOUTH ONCE DAILY FOR HEARTBURN 90 capsule 2   ondansetron  (ZOFRAN -ODT) 4 MG disintegrating tablet Take 1 tablet (4 mg total) by mouth every 8 (eight) hours as needed for nausea or vomiting. 15 tablet 0   pravastatin  (PRAVACHOL ) 40 MG tablet TAKE ONE TABLET BY MOUTH DAILY FOR CHOLESTEROL 90 tablet 2   Semaglutide , 2 MG/DOSE, 8 MG/3ML SOPN Inject 2 mg as directed once a week. for diabetes. 9 mL 1   sertraline  (ZOLOFT ) 50 MG tablet TAKE ONE TABLET BY MOUTH ONCE DAILY FOR ANXIETY 90 tablet 2   tretinoin  (RETIN-A ) 0.025 % cream Apply pea-sized amount to face at bedtime, wash off in morning. 20 g 3   tretinoin  (RETIN-A ) 0.025  % cream Apply topically at bedtime. 45 g 2   valACYclovir  (VALTREX ) 500 MG tablet Take 1 tab po bid for 7 days, starting 1 day prior to procedure (Patient not taking: Reported on 04/27/2024) 60 tablet 11   No current facility-administered medications on file prior to visit.    BP (!) 146/88   Pulse 88   Temp (!) 97.2 F (36.2 C) (Temporal)   Ht 5' 6 (1.676 m)   Wt  176 lb (79.8 kg)   SpO2 100%   BMI 28.41 kg/m  Objective:   Physical Exam Cardiovascular:     Rate and Rhythm: Normal rate and regular rhythm.  Pulmonary:     Effort: Pulmonary effort is normal.     Breath sounds: Normal breath sounds.  Musculoskeletal:     Cervical back: Neck supple.  Skin:    General: Skin is warm and dry.  Neurological:     Mental Status: She is alert and oriented to person, place, and time.  Psychiatric:        Mood and Affect: Mood normal.     Physical Exam        Assessment & Plan:  Controlled type 2 diabetes mellitus with hyperglycemia, without long-term current use of insulin (HCC) -     POCT glycosylated hemoglobin (Hb A1C)  Essential hypertension Assessment & Plan: Above goal today, also with home readings and prior visits.  Stop valsartan  160 mg daily. Start valsartan  hydrochlorothiazide 160-12.5 mg daily.  Will plan to see her back in 2 to 3 weeks for blood pressure check and BMP  Orders: -     Valsartan -hydroCHLOROthiazide; Take 1 tablet by mouth daily. for blood pressure.  Dispense: 30 tablet; Refill: 0  Encounter for immunization -     Flu vaccine trivalent PF, 6mos and older(Flulaval,Afluria,Fluarix,Fluzone)  Controlled type 2 diabetes mellitus (HCC) Assessment & Plan: Well-controlled with A1c of 4.9 today.  Continue Ozempic  2 mg weekly. Foot exam today.  Follow-up in 6 months     Assessment and Plan Assessment & Plan         Michelle MARLA Gaskins, NP     History of Present Illness

## 2024-04-27 NOTE — Assessment & Plan Note (Signed)
 Well-controlled with A1c of 4.9 today.  Continue Ozempic  2 mg weekly. Foot exam today.  Follow-up in 6 months

## 2024-05-20 ENCOUNTER — Other Ambulatory Visit: Payer: Self-pay | Admitting: Primary Care

## 2024-05-20 DIAGNOSIS — E1165 Type 2 diabetes mellitus with hyperglycemia: Secondary | ICD-10-CM

## 2024-05-22 ENCOUNTER — Encounter: Payer: Self-pay | Admitting: Dermatology

## 2024-05-22 ENCOUNTER — Ambulatory Visit (INDEPENDENT_AMBULATORY_CARE_PROVIDER_SITE_OTHER): Payer: Self-pay | Admitting: Dermatology

## 2024-05-22 ENCOUNTER — Ambulatory Visit: Payer: Self-pay | Admitting: Primary Care

## 2024-05-22 ENCOUNTER — Encounter: Payer: Self-pay | Admitting: Primary Care

## 2024-05-22 ENCOUNTER — Ambulatory Visit: Admitting: Primary Care

## 2024-05-22 VITALS — BP 138/82 | HR 93 | Temp 98.1°F | Ht 66.0 in | Wt 179.5 lb

## 2024-05-22 DIAGNOSIS — E119 Type 2 diabetes mellitus without complications: Secondary | ICD-10-CM

## 2024-05-22 DIAGNOSIS — L814 Other melanin hyperpigmentation: Secondary | ICD-10-CM

## 2024-05-22 DIAGNOSIS — I10 Essential (primary) hypertension: Secondary | ICD-10-CM

## 2024-05-22 DIAGNOSIS — E1165 Type 2 diabetes mellitus with hyperglycemia: Secondary | ICD-10-CM

## 2024-05-22 DIAGNOSIS — L578 Other skin changes due to chronic exposure to nonionizing radiation: Secondary | ICD-10-CM

## 2024-05-22 LAB — BASIC METABOLIC PANEL WITH GFR
BUN: 19 mg/dL (ref 6–23)
CO2: 27 meq/L (ref 19–32)
Calcium: 9.2 mg/dL (ref 8.4–10.5)
Chloride: 102 meq/L (ref 96–112)
Creatinine, Ser: 0.88 mg/dL (ref 0.40–1.20)
GFR: 70.63 mL/min (ref >60.00)
Glucose, Bld: 90 mg/dL (ref 70–99)
Potassium: 3.9 meq/L (ref 3.5–5.1)
Sodium: 137 meq/L (ref 135–145)

## 2024-05-22 MED ORDER — MOUNJARO 2.5 MG/0.5ML ~~LOC~~ SOAJ: 2.5000 mg | SUBCUTANEOUS | 0 refills | Status: DC

## 2024-05-22 NOTE — Assessment & Plan Note (Addendum)
 Improved.  We discussed today that BP goal is <130/80. She will work on weight loss through exercise.  We will also switch to Mounjaro for weight loss as Ozempic  seems ineffective.   She will continue to monitor. BMP pending

## 2024-05-22 NOTE — Patient Instructions (Signed)
 Wet Dressings at Home  1. Gather the necessary materials:  a. The medicated cream prescribed by your doctor  b. The 100% cotton material to be used as dressings: long underwear, T-shirts,       pajamas, cloth diapers, old bath towels, socks, or gloves  c. A basin containing warm water to moisten the material 2. Prepare the room by waterproofing a chair or bed with a large plastic bag or a sheet        of plastic, then cover the plastic with a bed sheet or large bath towel, and turn up the       heat in the room so you won't become chilled after the dressings are in place. 3. Wet the dressing in the basin, then squeeze or wring them so they are wet but not           dripping. 4. Apply the prescribed cream, wrap the affected skin with the wet dressings, and cover     the wet dressing with dry towels or put on a dry sweat suit over the wet dressings. 5. Sit or lie on the waterproofed chair or bed for the prescribed time (usually 30-60               minutes); watching television or listening to the radio during the treatment can help     pass  the time. 6. After the prescribed time, remove all the coverings and reapply moisturizers to your        skin as prescribed.  7. Put your dressings in the dryer to dry them out before the next use; wash them         frequently during the week.  NOTE: Wet dressings are a form of skin treatment placed over corticosteroid creams to increase the effectiveness of the creams and relieve itching. They are usually prescribed for use up to 3 times a day. Due to recent changes in healthcare laws, you may see results of your pathology and/or laboratory studies on MyChart before the doctors have had a chance to review them. We understand that in some cases there may be results that are confusing or concerning to you. Please understand that not all results are received at the same time and often the doctors may need to interpret multiple results in order to provide you with the  best plan of care or course of treatment. Therefore, we ask that you please give us  2 business days to thoroughly review all your results before contacting the office for clarification. Should we see a critical lab result, you will be contacted sooner.   If You Need Anything After Your Visit  If you have any questions or concerns for your doctor, please call our main line at [phone] and press option 4 to reach your doctor's medical assistant. If no one answers, please leave a voicemail as directed and we will return your call as soon as possible. Messages left after 4 pm will be answered the following business day.   You may also send us  a message via MyChart. We typically respond to MyChart messages within 1-2 business days.  For prescription refills, please ask your pharmacy to contact our office. Our fax number is [phone].  If you have an urgent issue when the clinic is closed that cannot wait until the next business day, you can page your doctor at the number below.    Please note that while we do our best to be available for urgent  issues outside of office hours, we are not available 24/7.   If you have an urgent issue and are unable to reach us , you may choose to seek medical care at your doctor's office, retail clinic, urgent care center, or emergency room.  If you have a medical emergency, please immediately call 911 or go to the emergency department.  Pager Numbers  - Dr. Hester: [phone]  - Dr. Jackquline: [phone]  - Dr. Claudene: [phone]   - Dr. Raymund: [phone]  In the event of inclement weather, please call our main line at [phone] for an update on the status of any delays or closures.  Dermatology Medication Tips: Please keep the boxes that topical medications come in in order to help keep track of the instructions about where and how to use these. Pharmacies typically print the medication instructions only on the boxes and not directly on the  medication tubes.   If your medication is too expensive, please contact our office at [phone] option 4 or send us  a message through MyChart.   We are unable to tell what your co-pay for medications will be in advance as this is different depending on your insurance coverage. However, we may be able to find a substitute medication at lower cost or fill out paperwork to get insurance to cover a needed medication.   If a prior authorization is required to get your medication covered by your insurance company, please allow us  1-2 business days to complete this process.  Drug prices often vary depending on where the prescription is filled and some pharmacies may offer cheaper prices.  The website www.goodrx.com contains coupons for medications through different pharmacies. The prices here do not account for what the cost may be with help from insurance (it may be cheaper with your insurance), but the website can give you the price if you did not use any insurance.  - You can print the associated coupon and take it with your prescription to the pharmacy.  - You may also stop by our office during regular business hours and pick up a GoodRx coupon card.  - If you need your prescription sent electronically to a different pharmacy, notify our office through [facility] or by phone at [phone] option 4.     Si Usted Necesita Algo Despus de Su Visita  Tambin puede enviarnos un mensaje a travs de Clinical Cytogeneticist. Por lo general respondemos a los mensajes de MyChart en el transcurso de 1 a 2 das hbiles.  Para renovar recetas, por favor pida a su farmacia que se ponga en contacto con nuestra oficina. Randi lakes de fax es [place] [phone].  Si tiene un asunto urgente cuando la clnica est cerrada y que no puede esperar hasta el siguiente da hbil, puede llamar/localizar a su doctor(a) al nmero que aparece a continuacin.   Por favor, tenga en cuenta que aunque hacemos todo lo posible  para estar disponibles para asuntos urgentes fuera del horario de [place], no estamos disponibles las 24 horas del da, los 7 [address]  [address] de la [place].   Si tiene un problema urgente y no puede comunicarse con nosotros, puede optar por buscar atencin mdica  en el consultorio de su doctor(a), en una clnica privada, en un centro de atencin urgente o en una sala de emergencias.  Si tiene engineer, drilling, por favor llame inmediatamente al 911 o vaya a la sala de emergencias.  Nmeros de bper  - Dr. Hester: [phone]  - Dra. Jackquline: [phone]  -  Dr. Claudene: [phone]  - Dra. Kitts: [phone]  En caso de inclemencias del [place], por favor llame a nuestra lnea principal al [phone] para una actualizacin sobre el estado de cualquier retraso o cierre.  Consejos para la medicacin en dermatologa: Por favor, guarde las cajas en las que vienen los medicamentos de uso tpico para ayudarle a seguir las instrucciones sobre dnde y cmo usarlos. Las farmacias generalmente imprimen las instrucciones del medicamento slo en las cajas y no directamente en los tubos del [place].   Si su medicamento es muy caro, por favor, pngase en contacto con landry rieger llamando al [phone] y presione la opcin 4 o envenos un mensaje a travs de Clinical Cytogeneticist.   No podemos decirle cul ser su copago por los medicamentos por adelantado ya que esto es diferente dependiendo de la cobertura de su seguro. Sin embargo, es posible que podamos encontrar un medicamento sustituto a audiological scientist un formulario para que el seguro cubra el medicamento que se considera necesario.   Si se requiere una autorizacin previa para que su compaa de seguros cubra su medicamento, por favor permtanos de 1 a 2 das hbiles para completar este proceso.  Los precios de los medicamentos varan con frecuencia dependiendo del environmental consultant de dnde se surte la receta y alguna farmacias pueden ofrecer precios ms  baratos.  El sitio web www.goodrx.com tiene cupones para medicamentos de health and safety inspector. Los precios aqu no tienen en cuenta lo que podra costar con la ayuda del seguro (puede ser ms barato con su seguro), pero el sitio web puede darle el precio si no utiliz tourist information centre manager.  - Puede imprimir el cupn correspondiente y llevarlo con su receta a la farmacia.  - Tambin puede pasar por nuestra oficina durante el horario de atencin regular y education officer, museum una tarjeta de cupones de GoodRx.  - Si necesita que su receta se enve electrnicamente a una farmacia diferente, informe a nuestra oficina a travs de MyChart de [place] o por telfono llamando al [phone] y presione la opcin 4.

## 2024-05-22 NOTE — Progress Notes (Signed)
 Follow-Up Visit   Subjective  Michelle Figueroa is a 63 y.o. female who presents for the following: Patient is here for 3rd BBL treatment for brown spots on face  The following portions of the chart were reviewed this encounter and updated as appropriate: medications, allergies, medical history  Review of Systems:  No other skin or systemic complaints except as noted in HPI or Assessment and Plan.  Objective  Well appearing patient in no apparent distress; mood and affect are within normal limits.  A focused examination was performed of the following areas: face  Relevant exam findings are noted in the Assessment and Plan.               1st pass for pigmented brown spots all over   1st pass setting for pigmented spot all over    2nd pass for pigmented spots all over face    2nd pass settings for pigmented spots all over face     Assessment & Plan   LENTIGINES/SKS and ACTINIC DAMAGE  Exam: scattered tan macules at face Treatment Plan: BBL Treatment today   Laser safety: Patient was advised in laser safety.  Patient was fitted with laser safety goggles and advised to keep eyes closed during procedure with goggles on. Staff and provider ensured that patient and their own safety goggles were also on and eyes protected during procedure. Laser room door was secured and locked from the inside. Laser room door has laser safety sign affixed to the outside of the door.    [place] BBL - 05/22/24 1700      Patient Details   Skin Type: I    Anesthestic Cream Applied: No    Photo Takes: Yes    Consent Signed: Yes    Improvement from Previous Treatment: Yes      Treatment Details   Date: 05/22/24    Treatment #: 3    Area: face    Filter: 1st Pass;2nd Pass      1st Pass   Location: F    Device: 515   pigmented lesion (brown spots all over face)   BBL j/cm2: 15    PW Msec Sec: 15    Target Temp: 15    Pulses: 59    11mm: this one      2nd Pass    Location: F    Device: 515   pigmented lesions (brown spots all over face )   BBL j/cm2: 12    PW Msec Sec: 10    Target Temp: 25    Pulses: 124    15x15: this one      Patient tolerated the procedure well.    [place] avoidance was stressed. The patient will call with any problems, questions or concerns prior to their next appointment.   ACTINIC DAMAGE - chronic, secondary to cumulative UV radiation exposure/sun exposure over time - diffuse scaly erythematous macules with underlying dyspigmentation - Recommend daily broad spectrum sunscreen SPF 30+ to sun-exposed areas, reapply every 2 hours as needed.  - Recommend staying in the shade or wearing long sleeves, sun glasses (UVA+UVB protection) and wide brim hats (4-inch brim around the entire circumference of the hat). - Call for new or changing lesions.  ACTINIC SKIN DAMAGE   [place]ENTIGO    No follow-ups on file.  LILLETTE Eleanor Blush, CMA, am acting as scribe for Alm Rhyme, MD.   Documentation: I have reviewed the above documentation for accuracy and completeness, and I agree with the  above.  Alm Rhyme, MD

## 2024-05-22 NOTE — Assessment & Plan Note (Signed)
 Agree to switch to Mounjaro for weight loss benefits.  Start tirzepitide (Mounjaro) for diabetes/weight loss. Start by injecting 2.5 mg into the skin once weekly for 4 weeks, then increase to 5 mg once weekly thereafter.   Follow up in 6 months.

## 2024-05-22 NOTE — Progress Notes (Signed)
 Subjective:    Patient ID: Michelle Figueroa, female    DOB: Dec 15, 1960, 63 y.o.   MRN: [id number]  Michelle Figueroa is a very pleasant 63 y.o. female with a history of hypertension, type 2 diabetes, hyperlipidemia who presents today for follow-up with hypertension. She would also like to discuss her Ozempic .  She was last evaluated on 04/27/2024 for follow-up of diabetes and evaluation of elevated blood pressure readings.  Over the last 6 months blood pressure readings were 140-150/100 despite management on valsartan  160 mg daily.  During this visit she was switched to valsartan -hydrochlorothiazide 160-12.5 mg daily.  She is here for follow-up today.  Since her last visit she has been compliant to valsartan -hydrochlorothiazide 160-12.5 mg daily. She is checking her BP at home which is running 130s/80s. She denies headaches, chest pain.   BP Readings from Last 3 Encounters:  05/22/24 138/82  04/27/24 (!) 146/88  11/08/23 (!) 152/100   Wt Readings from Last 3 Encounters:  05/22/24 179 lb 8 oz (81.4 kg)  04/27/24 176 lb (79.8 kg)  11/08/23 174 lb 12.8 oz (79.3 kg)   She managed on Ozempic  at 2 mg weekly for which she's been taking for quite some time. She's frustrated at her lack of weight loss. Her last A1C was 4.9 last month.    Review of Systems  Respiratory:  Negative for shortness of breath.   Cardiovascular:  Negative for chest pain.  Neurological:  Negative for headaches.         Past Medical History:  Diagnosis Date   Acute diarrhea 10/26/2022   Diabetes mellitus without complication (HCC)    followed by PCP   Diverticulosis    noted on colonoscopy   Essential hypertension    GERD (gastroesophageal reflux disease)    Hemorrhoids    Insomnia    Persistent cough for 3 weeks or longer 08/25/2021    Social History   Socioeconomic History   Marital status: Married    Spouse name: Not on file   Number of children: Not on file   Years of education: Not on file    Highest education level: Master's degree (e.g., MA, MS, MEng, MEd, MSW, MBA)  Occupational History   Not on file  Tobacco Use   Smoking status: Never   Smokeless tobacco: Never  Vaping Use   Vaping status: Never Used  Substance and Sexual Activity   Alcohol use: Yes    Alcohol/week: 3.0 standard drinks of alcohol    Types: 3 Standard drinks or equivalent per week   Drug use: No   Sexual activity: Yes    Birth control/protection: Post-menopausal  Other Topics Concern   Not on file  Social History Narrative   Married.   2 children.   Works in [employer] at [employer].    Enjoys traveling, spending time with family.    Social Drivers of Corporate Investment Banker Strain: Low Risk  (04/26/2024)   Overall Financial Resource Strain (CARDIA)    Difficulty of Paying Living Expenses: Not hard at all  Food Insecurity: No Food Insecurity (04/26/2024)   Hunger Vital Sign    Worried About Running Out of Food in the Last Year: Never true    Ran Out of Food in the Last Year: Never true  Transportation Needs: No Transportation Needs (04/26/2024)   PRAPARE - Administrator, Civil Service (Medical): No    Lack of Transportation (Non-Medical): No  Physical Activity: Insufficiently Active (04/26/2024)  Exercise Vital Sign    Days of Exercise per Week: 1 day    Minutes of Exercise per Session: 20 min  Stress: No Stress Concern Present (04/26/2024)   [employer] of Occupational Health - Occupational Stress Questionnaire    Feeling of Stress: Only a little  Social Connections: Moderately Integrated (04/26/2024)   Social Connection and Isolation Panel    Frequency of Communication with Friends and Family: More than three times a week    Frequency of Social Gatherings with Friends and Family: More than three times a week    Attends Religious Services: 1 to 4 times per year    Active Member of [employer] or Organizations: No    Attends Banker Meetings: Not  on file    Marital Status: Married  Intimate Partner Violence: Unknown (03/14/2022)   Received from [facility]   HITS    Physically Hurt: Not on file    Insult or Talk Down To: Not on file    Threaten Physical Harm: Not on file    Scream or Curse: Not on file    Past Surgical History:  Procedure Laterality Date   APPENDECTOMY  2009   CERVICAL FUSION  2010   CESAREAN SECTION     CHOLECYSTECTOMY  2000   [facility] Suspension Arthroplasty Left 12/25/2020   left thumb with tight rope and tendon interposition   ENDOMETRIAL ABLATION     GYNECOLOGIC CRYOSURGERY     RETROPUBIC SLING  10/2009   Dr. Devota  at [facility]   REVISION URINARY SLING  11/19/2009   with cysto due to urinary retension    Family History  Problem Relation Age of Onset   Arthritis Mother    Diabetes Mother    Colon cancer Mother 15   Lung cancer Father    Hypertension Father    Heart attack Father    Heart attack Brother 23   Breast cancer Neg Hx     Allergies  Allergen Reactions   Erythromycin Ethylsuccinate Rash   Oxycodone-Acetaminophen Nausea Only    Current Outpatient Medications on File Prior to Visit  Medication Sig Dispense Refill   estradiol  (ESTRACE  VAGINAL) 0.1 MG/GM vaginal cream 1 gram pv twice weekly 42.5 g 3   omeprazole  (PRILOSEC) 20 MG capsule TAKE 1 CAPSULE BY MOUTH ONCE DAILY FOR HEARTBURN 90 capsule 2   ondansetron  (ZOFRAN -ODT) 4 MG disintegrating tablet Take 1 tablet (4 mg total) by mouth every 8 (eight) hours as needed for nausea or vomiting. 15 tablet 0   pravastatin  (PRAVACHOL ) 40 MG tablet TAKE ONE TABLET BY MOUTH DAILY FOR CHOLESTEROL 90 tablet 2   sertraline  (ZOLOFT ) 50 MG tablet TAKE ONE TABLET BY MOUTH ONCE DAILY FOR ANXIETY 90 tablet 2   tretinoin  (RETIN-A ) 0.025 % cream Apply pea-sized amount to face at bedtime, wash off in morning. 20 g 3   tretinoin  (RETIN-A ) 0.025 % cream Apply topically at bedtime. 45 g 2   valsartan -hydrochlorothiazide (DIOVAN -HCT) 160-12.5 MG  tablet Take 1 tablet by mouth daily. for blood pressure. 30 tablet 0   No current facility-administered medications on file prior to visit.    BP 138/82   Pulse 93   Temp 98.1 F (36.7 C) (Oral)   Ht 5' 6 (1.676 m)   Wt 179 lb 8 oz (81.4 kg)   SpO2 97%   BMI 28.97 kg/m  Objective:   Physical Exam Cardiovascular:     Rate and Rhythm: Normal rate and regular rhythm.  Pulmonary:  Effort: Pulmonary effort is normal.     Breath sounds: Normal breath sounds.  Musculoskeletal:     Cervical back: Neck supple.  Skin:    General: Skin is warm and dry.  Neurological:     Mental Status: She is alert and oriented to person, place, and time.  Psychiatric:        Mood and Affect: Mood normal.     Physical Exam        Assessment & Plan:  Controlled type 2 diabetes mellitus with hyperglycemia, without long-term current use of insulin (HCC) -     Mounjaro; Inject 2.5 mg into the skin once a week. for diabetes.  Dispense: 2 mL; Refill: 0  Essential hypertension Assessment & Plan: Improved.  We discussed today that BP goal is <130/80. She will work on weight loss through exercise.  We will also switch to Mounjaro for weight loss as Ozempic  seems ineffective.   She will continue to monitor. BMP pending  Orders: -     Basic metabolic panel with GFR  Controlled type 2 diabetes mellitus (HCC) Assessment & Plan: Agree to switch to Mounjaro for weight loss benefits.  Start tirzepitide (Mounjaro) for diabetes/weight loss. Start by injecting 2.5 mg into the skin once weekly for 4 weeks, then increase to 5 mg once weekly thereafter.   Follow up in 6 months.     Assessment and Plan Assessment & Plan        Comer MARLA Gaskins, NP     History of Present Illness

## 2024-05-22 NOTE — Patient Instructions (Signed)
 Start tirzepitide (Mounjaro) for diabetes/weight loss. Start by injecting 2.5 mg into the skin once weekly for 4 weeks, then increase to 5 mg once weekly thereafter. Please notify me once you've used your last 2.5 mg pen so that I can prescribe the next dose.   Stop Ozempic .  Continue to work on lifestyle changes to improve your blood pressure  Please schedule a physical to meet with me in 6 months.   It was a pleasure to see you today!

## 2024-05-24 ENCOUNTER — Telehealth: Payer: Self-pay

## 2024-05-24 NOTE — Telephone Encounter (Signed)
 Called patient to see how she was doing after her recent BBL treatment. Patient states she is doing well. Did notice some puffiness yesterday but has went down today. Will continue to use her gel cooling mask. Did not have any questions or concerns. Overall expressed happiness with results so far seen.

## 2024-05-28 MED ORDER — VALSARTAN-HYDROCHLOROTHIAZIDE 160-12.5 MG PO TABS: 1.0000 | ORAL_TABLET | Freq: Every day | ORAL | 1 refills | Status: AC

## 2024-05-31 DIAGNOSIS — M25562 Pain in left knee: Secondary | ICD-10-CM | POA: Diagnosis not present

## 2024-06-11 DIAGNOSIS — I10 Essential (primary) hypertension: Secondary | ICD-10-CM | POA: Diagnosis not present

## 2024-06-11 DIAGNOSIS — Z6828 Body mass index (BMI) 28.0-28.9, adult: Secondary | ICD-10-CM | POA: Diagnosis not present

## 2024-06-11 DIAGNOSIS — J020 Streptococcal pharyngitis: Secondary | ICD-10-CM | POA: Diagnosis not present

## 2024-06-18 DIAGNOSIS — M25562 Pain in left knee: Secondary | ICD-10-CM | POA: Diagnosis not present

## 2024-06-20 ENCOUNTER — Ambulatory Visit: Payer: BC Managed Care – PPO | Admitting: Dermatology

## 2024-06-22 ENCOUNTER — Other Ambulatory Visit: Payer: Self-pay | Admitting: Primary Care

## 2024-06-22 DIAGNOSIS — F411 Generalized anxiety disorder: Secondary | ICD-10-CM

## 2024-06-25 DIAGNOSIS — R519 Headache, unspecified: Secondary | ICD-10-CM | POA: Diagnosis not present

## 2024-06-25 DIAGNOSIS — E1165 Type 2 diabetes mellitus with hyperglycemia: Secondary | ICD-10-CM

## 2024-06-25 DIAGNOSIS — Z6828 Body mass index (BMI) 28.0-28.9, adult: Secondary | ICD-10-CM | POA: Diagnosis not present

## 2024-06-25 DIAGNOSIS — J0110 Acute frontal sinusitis, unspecified: Secondary | ICD-10-CM | POA: Diagnosis not present

## 2024-06-25 DIAGNOSIS — J029 Acute pharyngitis, unspecified: Secondary | ICD-10-CM | POA: Diagnosis not present

## 2024-06-25 MED ORDER — MOUNJARO 5 MG/0.5ML ~~LOC~~ SOAJ: 5.0000 mg | SUBCUTANEOUS | 0 refills | Status: AC

## 2024-06-27 ENCOUNTER — Other Ambulatory Visit (HOSPITAL_BASED_OUTPATIENT_CLINIC_OR_DEPARTMENT_OTHER): Payer: Self-pay | Admitting: Obstetrics & Gynecology

## 2024-06-27 ENCOUNTER — Other Ambulatory Visit (HOSPITAL_BASED_OUTPATIENT_CLINIC_OR_DEPARTMENT_OTHER): Payer: Self-pay

## 2024-06-27 ENCOUNTER — Other Ambulatory Visit (HOSPITAL_COMMUNITY): Payer: Self-pay

## 2024-06-27 ENCOUNTER — Telehealth: Payer: Self-pay

## 2024-06-27 DIAGNOSIS — N952 Postmenopausal atrophic vaginitis: Secondary | ICD-10-CM

## 2024-06-27 DIAGNOSIS — M1712 Unilateral primary osteoarthritis, left knee: Secondary | ICD-10-CM | POA: Diagnosis not present

## 2024-06-27 DIAGNOSIS — M2242 Chondromalacia patellae, left knee: Secondary | ICD-10-CM | POA: Diagnosis not present

## 2024-06-27 MED ORDER — ESTRADIOL 0.01 % VA CREA: 1.0000 | TOPICAL_CREAM | Freq: Every day | VAGINAL | 0 refills | Status: AC

## 2024-06-27 NOTE — Telephone Encounter (Signed)
 Noted

## 2024-06-27 NOTE — Telephone Encounter (Signed)
 Clinical questions have been answered and PA submitted. PA currently Pending. Please be advised that most companies allow up to 30 days to make a decision. We will advise when a determination has been made, or follow up in 1 week.   Please reach out to our team, Rx Prior Auth Pool, if you haven't heard back in a week.

## 2024-06-27 NOTE — Telephone Encounter (Signed)
 Pharmacy Patient Advocate Encounter   Received notification from Onbase that prior authorization for Mounjaro  5 is required/requested.   Insurance verification completed.   The patient is insured through [facility].   Per test claim: PA required; PA started via CoverMyMeds. KEY [id number] . Waiting for clinical questions to populate.

## 2024-06-27 NOTE — Telephone Encounter (Signed)
 Pharmacy Patient Advocate Encounter  Received notification from [facility] that Prior Authorization for Mounjaro  5 has been APPROVED from 06/27/24 to 06/27/27. Ran test claim, Copay is $25.00. This test claim was processed through [facility]- copay amounts may vary at other pharmacies due to pharmacy/plan contracts, or as the patient moves through the different stages of their insurance plan.   PA #/Case ID/Reference #: # [id number]

## 2024-06-28 ENCOUNTER — Other Ambulatory Visit: Payer: Self-pay | Admitting: Primary Care

## 2024-06-28 DIAGNOSIS — E1165 Type 2 diabetes mellitus with hyperglycemia: Secondary | ICD-10-CM

## 2024-06-29 MED ORDER — MOUNJARO 5 MG/0.5ML ~~LOC~~ SOAJ: 5.0000 mg | SUBCUTANEOUS | 0 refills | Status: AC

## 2024-07-15 ENCOUNTER — Encounter (HOSPITAL_BASED_OUTPATIENT_CLINIC_OR_DEPARTMENT_OTHER): Payer: Self-pay | Admitting: Obstetrics & Gynecology

## 2024-07-25 ENCOUNTER — Other Ambulatory Visit: Payer: Self-pay | Admitting: Primary Care

## 2024-07-25 DIAGNOSIS — E7800 Pure hypercholesterolemia, unspecified: Secondary | ICD-10-CM

## 2024-08-15 ENCOUNTER — Ambulatory Visit: Payer: Self-pay

## 2024-08-15 NOTE — Telephone Encounter (Signed)
 Noted. Agree with nursing triage decision. Appreciate Dr Copland's evaluation.

## 2024-08-15 NOTE — Telephone Encounter (Signed)
 FYI Only or Action Required?: FYI only for provider: appointment scheduled on 2/5.  Patient was last seen in primary care on 05/22/2024 by Gretta Comer POUR, NP.  Called Nurse Triage reporting Cough and Epistaxis.  Symptoms began today.  Interventions attempted: Nothing.  Symptoms are: stable.  Triage Disposition: Home Care  Patient/caregiver understands and will follow disposition?:   Reason for Triage: Pt has a fever, bloody nose twice today, tried to do covid/flu test at home but pt states the swab was filled with blood.   Reason for Disposition  Cough  [1] Mild-moderate nosebleed AND [2] bleeding stopped now  Answer Assessment - Initial Assessment Questions Appt made at her request for flu testing  1. AMOUNT OF BLEEDING: How bad is the bleeding? How much blood was lost? Has the bleeding stopped?     Bleeding has stopped 3. FREQUENCY: How many nosebleeds have you had in the last 24 hours?      Has had 2 today 5. CAUSE: What do you think caused this nosebleed?     unknown 6. LOCAL FACTORS: Do you have any cold symptoms?, Have you been rubbing or picking at your nose?     Mild cough, fever  Protocols used: Cough - Acute Non-Productive-A-AH, Nosebleed-A-AH

## 2024-08-15 NOTE — Telephone Encounter (Signed)
 Next Appt With Family Medicine Michail Schroeder, MD) 08/16/2024 at 12:00 PM

## 2024-08-16 ENCOUNTER — Ambulatory Visit: Admitting: Family Medicine

## 2024-08-16 ENCOUNTER — Encounter: Payer: Self-pay | Admitting: Family Medicine

## 2024-08-16 VITALS — BP 120/70 | HR 106 | Temp 98.6°F | Ht 66.0 in | Wt 173.1 lb

## 2024-08-16 DIAGNOSIS — R6889 Other general symptoms and signs: Secondary | ICD-10-CM

## 2024-08-16 DIAGNOSIS — R051 Acute cough: Secondary | ICD-10-CM

## 2024-08-16 DIAGNOSIS — R509 Fever, unspecified: Secondary | ICD-10-CM

## 2024-08-16 LAB — POC INFLUENZA A&B (BINAX/QUICKVUE)
Influenza A, POC: NEGATIVE
Influenza B, POC: NEGATIVE

## 2024-08-16 LAB — POC COVID19 BINAXNOW: SARS Coronavirus 2 Ag: NEGATIVE

## 2024-08-16 NOTE — Progress Notes (Unsigned)
 "    Braydee Shimkus T. Babita Amaker, MD, CAQ Sports Medicine [facility] [address] [place], [address]  Phone: [phone]  FAX: [phone]  Michelle Figueroa - 64 y.o. female  MRN [id number]  Date of Birth: 1961/02/21  Date: 08/16/2024  PCP: Gretta Comer POUR, NP  Referral: Gretta Comer POUR, NP  Chief Complaint  Patient presents with   Fever   Fatigue   Headache   Cough    Dry   Epistaxis   Subjective:   Michelle Figueroa is a 64 y.o. very pleasant female patient with Body mass index is 27.94 kg/m. who presents with the following:  Discussed the use of AI scribe software for clinical note transcription with the patient, who gave verbal consent to proceed.  Covid and flu tests are negative -  History of Present Illness Michelle Figueroa is a 64 year old female with diabetes who presents with extreme fatigue and fever.  She felt perfectly fine yesterday morning while taking care of her four-year-old granddaughter. After a trip to [employer] and doing laundry, she experienced extreme fatigue, back pain, and a headache. Within an hour, she developed a fever of 100.63F. The fatigue is described as terrible and unlike anything she has experienced before, which is concerning to her as it is not typical.  She has been around her grandchildren, who have been experiencing symptoms of illness, including rhinorrhea with yellow discharge. She has been taking care of them since Saturday due to their daycare being closed. She wonders if the fatigue could be related to the flu or another illness.  She denies being a smoker and reports no significant shortness of breath, although she feels slightly winded. She also experienced epistaxis twice yesterday, with running and bleeding. Her temperature typically runs around 9F, and she feels unwell when it reaches 53F or higher. The highest recorded temperature during this illness was 100.63F.     Review of Systems  is noted in the HPI, as appropriate  Objective:   BP 120/70   Pulse (!) 106   Temp 98.6 F (37 C) (Temporal)   Ht 5' 6 (1.676 m)   Wt 173 lb 2 oz (78.5 kg)   SpO2 97%   BMI 27.94 kg/m    Gen: WDWN, NAD. Globally Non-toxic HEENT: Throat clear, w/o exudate, R TM clear, L TM - good landmarks, No fluid present. rhinnorhea.  MMM Frontal sinuses: NT Max sinuses: NT NECK: Anterior cervical  LAD is absent CV: RRR, No M/G/R, cap refill <2 sec PULM: Breathing comfortably in no respiratory distress. no wheezing, crackles, rhonchi   Laboratory and Imaging Data: Results for orders placed or performed in visit on 08/16/24  POC COVID-19   Collection Time: 08/16/24 12:40 PM  Result Value Ref Range   SARS Coronavirus 2 Ag Negative Negative  POC Influenza A&B (Binax test)   Collection Time: 08/16/24 12:40 PM  Result Value Ref Range   Influenza A, POC Negative Negative   Influenza B, POC Negative Negative     Assessment and Plan:     ICD-10-CM   1. Flu-like symptoms  R68.89     2. Acute cough  R05.1 POC COVID-19    POC Influenza A&B (Binax test)    3. Fever, unspecified fever cause  R50.9 POC COVID-19    POC Influenza A&B (Binax test)     Assessment & Plan Acute viral upper respiratory infection Symptoms consistent with viral URI. COVID and flu tests negative  but early testing may be inaccurate. Diabetes and hypertension increase complication risk. - Symptomatic treatment with acetaminophen, ibuprofen , and OTC cold medications. - Encouraged increased fluid intake and rest. - Advised against alcohol consumption. - Recommended retesting for COVID and flu in two days if symptoms persist. - Advised staying home while feverish and wearing a mask if going out.  Medication Management during today's office visit: No orders of the defined types were placed in this encounter.  Medications Discontinued During This Encounter  Medication Reason   estradiol  (ESTRACE  VAGINAL) 0.1 MG/GM  vaginal cream Duplicate    Orders placed today for conditions managed today: Orders Placed This Encounter  Procedures   POC COVID-19   POC Influenza A&B (Binax test)    Disposition: f/u prn  [employer] One speech-to-text software was used for transcription in this dictation.  Possible transcriptional errors can occur using Animal nutritionist.   Signed,  Jacques DASEN. Harly Pipkins, MD   Outpatient Encounter Medications as of 08/16/2024  Medication Sig   estradiol  (ESTRACE ) 0.01 % CREA vaginal cream Place 1 Applicatorful vaginally at bedtime.   omeprazole  (PRILOSEC) 20 MG capsule TAKE 1 CAPSULE BY MOUTH ONCE DAILY FOR HEARTBURN   ondansetron  (ZOFRAN -ODT) 4 MG disintegrating tablet Take 1 tablet (4 mg total) by mouth every 8 (eight) hours as needed for nausea or vomiting.   pravastatin  (PRAVACHOL ) 40 MG tablet TAKE ONE TABLET BY MOUTH DAILY FOR CHOLESTEROL   sertraline  (ZOLOFT ) 50 MG tablet TAKE ONE TABLET BY MOUTH ONCE DAILY FOR ANXIETY   tirzepatide  (MOUNJARO ) 5 MG/0.5ML Pen Inject 5 mg into the skin once a week. for diabetes.   tretinoin  (RETIN-A ) 0.025 % cream Apply pea-sized amount to face at bedtime, wash off in morning.   valsartan -hydrochlorothiazide  (DIOVAN -HCT) 160-12.5 MG tablet Take 1 tablet by mouth daily. for blood pressure.   [DISCONTINUED] estradiol  (ESTRACE  VAGINAL) 0.1 MG/GM vaginal cream 1 gram pv twice weekly   No facility-administered encounter medications on file as of 08/16/2024.   "

## 2024-09-12 ENCOUNTER — Ambulatory Visit: Admitting: Primary Care
